# Patient Record
Sex: Male | Born: 1995 | Race: Black or African American | Hispanic: No | Marital: Single | Smoking: Current every day smoker
Health system: Southern US, Community
[De-identification: ages and names within clinical notes are randomized; demographics above are authoritative.]

## PROBLEM LIST (undated history)

## (undated) DIAGNOSIS — R569 Unspecified convulsions: Secondary | ICD-10-CM

## (undated) DIAGNOSIS — D849 Immunodeficiency, unspecified: Secondary | ICD-10-CM

## (undated) DIAGNOSIS — A749 Chlamydial infection, unspecified: Secondary | ICD-10-CM

## (undated) DIAGNOSIS — A546 Gonococcal infection of anus and rectum: Secondary | ICD-10-CM

## (undated) DIAGNOSIS — A539 Syphilis, unspecified: Secondary | ICD-10-CM

## (undated) DIAGNOSIS — N179 Acute kidney failure, unspecified: Secondary | ICD-10-CM

## (undated) DIAGNOSIS — L03119 Cellulitis of unspecified part of limb: Secondary | ICD-10-CM

## (undated) DIAGNOSIS — Z789 Other specified health status: Secondary | ICD-10-CM

## (undated) DIAGNOSIS — B20 Human immunodeficiency virus [HIV] disease: Secondary | ICD-10-CM

## (undated) DIAGNOSIS — B3781 Candidal esophagitis: Secondary | ICD-10-CM

## (undated) DIAGNOSIS — A0472 Enterocolitis due to Clostridium difficile, not specified as recurrent: Secondary | ICD-10-CM

## (undated) DIAGNOSIS — R85612 Low grade squamous intraepithelial lesion on cytologic smear of anus (LGSIL): Secondary | ICD-10-CM

## (undated) DIAGNOSIS — A072 Cryptosporidiosis: Secondary | ICD-10-CM

## (undated) DIAGNOSIS — K602 Anal fissure, unspecified: Secondary | ICD-10-CM

## (undated) DIAGNOSIS — A6000 Herpesviral infection of urogenital system, unspecified: Secondary | ICD-10-CM

## (undated) DIAGNOSIS — A5149 Other secondary syphilitic conditions: Secondary | ICD-10-CM

## (undated) HISTORY — PX: NO PAST SURGERIES: SHX2092

## (undated) HISTORY — PX: WISDOM TOOTH EXTRACTION: SHX21

## (undated) HISTORY — DX: Gonococcal infection of anus and rectum: A54.6

## (undated) HISTORY — PX: ANAL EXAMINATION UNDER ANESTHESIA: SHX1138

## (undated) HISTORY — DX: Chlamydial infection, unspecified: A74.9

---
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
## 2015-09-27 DIAGNOSIS — R569 Unspecified convulsions: Secondary | ICD-10-CM

## 2015-09-27 DIAGNOSIS — K602 Anal fissure, unspecified: Secondary | ICD-10-CM

## 2015-09-27 DIAGNOSIS — A6000 Herpesviral infection of urogenital system, unspecified: Secondary | ICD-10-CM

## 2015-09-27 DIAGNOSIS — B3781 Candidal esophagitis: Secondary | ICD-10-CM

## 2015-09-27 HISTORY — DX: Unspecified convulsions: R56.9

## 2015-09-27 HISTORY — DX: Herpesviral infection of urogenital system, unspecified: A60.00

## 2015-09-27 HISTORY — DX: Candidal esophagitis: B37.81

## 2015-09-27 HISTORY — DX: Anal fissure, unspecified: K60.2

## 2016-07-27 DIAGNOSIS — L03119 Cellulitis of unspecified part of limb: Secondary | ICD-10-CM

## 2016-07-27 HISTORY — DX: Cellulitis of unspecified part of limb: L03.119

## 2016-08-13 ENCOUNTER — Emergency Department (HOSPITAL_COMMUNITY): Payer: Medicaid Other

## 2016-08-13 ENCOUNTER — Encounter (HOSPITAL_COMMUNITY): Payer: Self-pay | Admitting: Emergency Medicine

## 2016-08-13 ENCOUNTER — Emergency Department (HOSPITAL_COMMUNITY)
Admission: EM | Admit: 2016-08-13 | Discharge: 2016-08-13 | Disposition: A | Discharge status: Home / Self Care | Payer: Medicaid Other | Attending: Emergency Medicine | Admitting: Emergency Medicine

## 2016-08-13 DIAGNOSIS — Y999 Unspecified external cause status: Secondary | ICD-10-CM | POA: Diagnosis not present

## 2016-08-13 DIAGNOSIS — R059 Cough, unspecified: Secondary | ICD-10-CM

## 2016-08-13 DIAGNOSIS — B379 Candidiasis, unspecified: Secondary | ICD-10-CM | POA: Insufficient documentation

## 2016-08-13 DIAGNOSIS — S31809A Unspecified open wound of unspecified buttock, initial encounter: Secondary | ICD-10-CM | POA: Insufficient documentation

## 2016-08-13 DIAGNOSIS — Y939 Activity, unspecified: Secondary | ICD-10-CM | POA: Insufficient documentation

## 2016-08-13 DIAGNOSIS — R05 Cough: Secondary | ICD-10-CM

## 2016-08-13 DIAGNOSIS — X58XXXA Exposure to other specified factors, initial encounter: Secondary | ICD-10-CM | POA: Insufficient documentation

## 2016-08-13 DIAGNOSIS — Y929 Unspecified place or not applicable: Secondary | ICD-10-CM | POA: Diagnosis not present

## 2016-08-13 DIAGNOSIS — L03818 Cellulitis of other sites: Secondary | ICD-10-CM | POA: Insufficient documentation

## 2016-08-13 DIAGNOSIS — F17210 Nicotine dependence, cigarettes, uncomplicated: Secondary | ICD-10-CM | POA: Diagnosis not present

## 2016-08-13 DIAGNOSIS — B370 Candidal stomatitis: Secondary | ICD-10-CM

## 2016-08-13 DIAGNOSIS — M7989 Other specified soft tissue disorders: Secondary | ICD-10-CM | POA: Insufficient documentation

## 2016-08-13 DIAGNOSIS — B20 Human immunodeficiency virus [HIV] disease: Secondary | ICD-10-CM | POA: Insufficient documentation

## 2016-08-13 DIAGNOSIS — K6289 Other specified diseases of anus and rectum: Secondary | ICD-10-CM | POA: Diagnosis not present

## 2016-08-13 DIAGNOSIS — S3992XA Unspecified injury of lower back, initial encounter: Secondary | ICD-10-CM | POA: Diagnosis present

## 2016-08-13 HISTORY — DX: Immunodeficiency, unspecified: D84.9

## 2016-08-13 HISTORY — DX: Human immunodeficiency virus (HIV) disease: B20

## 2016-08-13 LAB — BASIC METABOLIC PANEL
Anion gap: 7 (ref 5–15)
BUN: 10 mg/dL (ref 6–20)
CO2: 29 mmol/L (ref 22–32)
Calcium: 8.8 mg/dL — ABNORMAL LOW (ref 8.9–10.3)
Chloride: 101 mmol/L (ref 101–111)
Creatinine, Ser: 0.81 mg/dL (ref 0.61–1.24)
GFR calc Af Amer: >60 mL/min (ref >60)
GFR calc non Af Amer: >60 mL/min (ref >60)
Glucose, Bld: 87 mg/dL (ref 65–99)
Potassium: 3.3 mmol/L — ABNORMAL LOW (ref 3.5–5.1)
Sodium: 137 mmol/L (ref 135–145)

## 2016-08-13 LAB — CBC WITH DIFFERENTIAL/PLATELET
Basophils Absolute: 0 10*3/uL (ref 0.0–0.1)
Basophils Relative: 0 %
Eosinophils Absolute: 0.5 10*3/uL (ref 0.0–0.7)
Eosinophils Relative: 9 %
HCT: 33.3 % — ABNORMAL LOW (ref 39.0–52.0)
Hemoglobin: 11.1 g/dL — ABNORMAL LOW (ref 13.0–17.0)
Lymphocytes Relative: 11 %
Lymphs Abs: 0.6 10*3/uL — ABNORMAL LOW (ref 0.7–4.0)
MCH: 30.5 pg (ref 26.0–34.0)
MCHC: 33.3 g/dL (ref 30.0–36.0)
MCV: 91.5 fL (ref 78.0–100.0)
Monocytes Absolute: 0.5 10*3/uL (ref 0.1–1.0)
Monocytes Relative: 8 %
Neutro Abs: 4.2 10*3/uL (ref 1.7–7.7)
Neutrophils Relative %: 72 %
Platelets: 192 10*3/uL (ref 150–400)
RBC: 3.64 MIL/uL — ABNORMAL LOW (ref 4.22–5.81)
RDW: 13 % (ref 11.5–15.5)
WBC: 5.8 10*3/uL (ref 4.0–10.5)

## 2016-08-13 MED ORDER — FLUCONAZOLE 100 MG PO TABS: 100.0000 mg | ORAL_TABLET | Freq: Every day | ORAL | 0 refills | Status: AC

## 2016-08-13 MED ORDER — FLUCONAZOLE 100 MG PO TABS
200.0000 mg | ORAL_TABLET | Freq: Once | ORAL | Status: AC
  Administered 2016-08-13: 200 mg via ORAL
  Filled 2016-08-13: qty 2

## 2016-08-13 MED ORDER — DOXYCYCLINE HYCLATE 100 MG PO CAPS: 100.0000 mg | ORAL_CAPSULE | Freq: Two times a day (BID) | ORAL | 0 refills | Status: DC

## 2016-08-13 MED ORDER — IOPAMIDOL (ISOVUE-300) INJECTION 61%
INTRAVENOUS | Status: AC
  Administered 2016-08-13: 100 mL
  Filled 2016-08-13: qty 100

## 2016-08-13 MED ORDER — DEXTROSE 5 % IV SOLN
1.0000 g | Freq: Once | INTRAVENOUS | Status: AC
  Administered 2016-08-13: 1 g via INTRAVENOUS
  Filled 2016-08-13: qty 10

## 2016-08-13 MED ORDER — SODIUM CHLORIDE 0.9 % IV BOLUS (SEPSIS)
1000.0000 mL | Freq: Once | INTRAVENOUS | Status: AC
  Administered 2016-08-13: 1000 mL via INTRAVENOUS

## 2016-08-13 MED ORDER — METOCLOPRAMIDE HCL 5 MG/ML IJ SOLN
10.0000 mg | Freq: Once | INTRAMUSCULAR | Status: AC
  Administered 2016-08-13: 10 mg via INTRAVENOUS
  Filled 2016-08-13: qty 2

## 2016-08-13 NOTE — ED Notes (Signed)
Patient taken to CT.

## 2016-08-13 NOTE — ED Triage Notes (Signed)
Three complaints: 1. Middle finger of left hand is swollen for a week. No known injury. Warmth present. 2. Nasal congestion, sore throat, cough for a month.  3. Anal lesions that are bleeding and irritated. [facility] and did not follow up. 4. Has HIV and has not seen a doctor or taken any medications for two year. Concerned for progression of disease.

## 2016-08-13 NOTE — ED Provider Notes (Signed)
[facility]-EMERGENCY DEPT Provider Note   CSN: [id number] Arrival date & time: 08/13/16  1153     History   Chief Complaint Chief Complaint  Patient presents with  . Cough  . Finger Injury    HPI [name] [name] is a 20 y.o. male.  The history is provided by the patient and medical records. No language interpreter was used.   [name] [name] is a 20 y.o. male  with a PMH of HIV not receiving treatment and unsure of last CD4 count who presents to the Emergency Department with multiple complaints:  1. Left middle finger swelling. Initially started one week ago: atraumatic. He states that he just woke up with his finger red and swollen. He was [facility] where he states an x-ray was taken and he was told it was infected. He received "a shot for the infection" and rx for tramadol. Patient states swelling has somewhat improved but the finger is still red, painful and unable to move the finger correctly.   2. Worsening chest congestion for the last 1-2 months. Associated symptoms include sore throat, dysphagia, and cough productive of yellow and white sputum. He took Nyquil with little relief.   3. Perianal skin changes which initially developed in November 2016. He was told that he had several anal fissures. Initially were causing him no problems, however for the last 6 months he has been experiencing worsening symptoms including itching, pain with sitting and drainage: sometimes blood, sometimes purulent. He has tried Preparation H with no relief.   Patient denies fever, chest pain, shortness of breath, abdominal pain, constipation, diarrhea, back pain, numbness, tingling, muscle weakness.   Initially diagnosed with HIV in 2014 and [place] [facility] [facility] [place] and has not been able to follow up with her since that time due to transportation issues.    Past Medical History:  Diagnosis Date  . HIV disease (HCC)   . Immune  deficiency disorder (HCC)     There are no active problems to display for this patient.   History reviewed. No pertinent surgical history.     Home Medications    Prior to Admission medications   Medication Sig Start Date End Date Taking? Authorizing Provider  ibuprofen (ADVIL,MOTRIN) 200 MG tablet Take 400 mg by mouth every 6 (six) hours as needed for headache or moderate pain.   Yes Historical Provider, MD  Pseudoeph-Doxylamine-DM-APAP (NYQUIL PO) Take 2 capsules by mouth as needed (for cough and congestion).   Yes Historical Provider, MD  doxycycline (VIBRAMYCIN) 100 MG capsule Take 1 capsule (100 mg total) by mouth 2 (two) times daily. 08/13/16   Rosalita Carey Pilcher Demarius Archila, PA-C  fluconazole (DIFLUCAN) 100 MG tablet Take 1 tablet (100 mg total) by mouth daily. 08/13/16 08/27/16  Chase PicketJaime Pilcher Jaxsyn Catalfamo, PA-C    Family History History reviewed. No pertinent family history.  Social History Social History  Substance Use Topics  . Smoking status: Current Every Day Smoker    Types: Cigarettes  . Smokeless tobacco: Never Used  . Alcohol use No     Allergies   Patient has no known allergies.   Review of Systems Review of Systems  Constitutional: Negative for chills and fever.  HENT: Positive for sore throat and trouble swallowing. Negative for congestion.   Eyes: Negative for visual disturbance.  Respiratory: Positive for cough. Negative for shortness of breath and wheezing.   Cardiovascular: Negative for chest pain.  Gastrointestinal:  Negative for abdominal pain, constipation, diarrhea, nausea and vomiting.  Musculoskeletal: Positive for arthralgias and joint swelling.  Skin: Positive for color change and wound.  Allergic/Immunologic: Positive for immunocompromised state.  Neurological: Negative for headaches.     Physical Exam Updated Vital Signs BP 138/90   Pulse 90   Temp 98.6 F (37 C) (Oral)   Resp 18   SpO2 96%   Physical Exam  Constitutional: He is oriented to  person, place, and time. He appears well-developed and well-nourished. No distress.  HENT:  Head: Normocephalic and atraumatic.  White exudates covering entire oropharynx.   Cardiovascular: Normal heart sounds.   No murmur heard. Tachycardic but regular.  Pulmonary/Chest: Effort normal and breath sounds normal. No respiratory distress. He has no wheezes. He has no rales. He exhibits no tenderness.  Abdominal: Soft. Bowel sounds are normal. He exhibits no distension. There is no tenderness.  Genitourinary:  Genitourinary Comments: Perianal region with multiple areas of skin maceration which is tender to the touch. No focal areas of fluctuance.   Musculoskeletal:  Left middle finger warm, erythematous and swollen with decreased ROM. 2+ radial pulse and sensation intact.   Neurological: He is alert and oriented to person, place, and time.  Skin: Skin is warm and dry.  Nursing note and vitals reviewed.    ED Treatments / Results  Labs (all labs ordered are listed, but only abnormal results are displayed) Labs Reviewed  CBC WITH DIFFERENTIAL/PLATELET - Abnormal; Notable for the following:       Result Value   RBC 3.64 (*)    Hemoglobin 11.1 (*)    HCT 33.3 (*)    Lymphs Abs 0.6 (*)    All other components within normal limits  BASIC METABOLIC PANEL - Abnormal; Notable for the following:    Potassium 3.3 (*)    Calcium 8.8 (*)    All other components within normal limits  T-HELPER CELLS (CD4) COUNT (NOT AT [facility])  HIV-1 RNA ULTRAQUANT REFLEX TO GENTYP+    EKG  EKG Interpretation None       Radiology Dg Chest 2 View  Result Date: 08/13/2016 CLINICAL DATA:  Chest pain and shortness of breath EXAM: CHEST  2 VIEW COMPARISON:  None. FINDINGS: The lungs are clear. Heart size and pulmonary vascularity are normal. No pneumothorax. No adenopathy. No bone lesions. IMPRESSION: No edema or consolidation. Electronically Signed   By: Bretta Bang III M.D.   On: 08/13/2016 13:59    Ct Abdomen Pelvis W Contrast  Result Date: 08/13/2016 CLINICAL DATA:  A normal region bleeding and irritation. HIV disease. EXAM: CT ABDOMEN AND PELVIS WITH CONTRAST TECHNIQUE: Multidetector CT imaging of the abdomen and pelvis was performed using the standard protocol following bolus administration of intravenous contrast. CONTRAST:  ISOVUE-300 IOPAMIDOL (ISOVUE-300) INJECTION 61% COMPARISON:  None. FINDINGS: Lower chest: Lung bases are clear. Hepatobiliary: No focal liver lesions are evident. Gallbladder is contracted. No pericholecystic fluid evident. There is no biliary duct dilatation. Pancreas: There is no evident pancreatic mass or inflammatory focus. Spleen: Spleen appears upper normal in size. No focal splenic lesions are evident. Adrenals/Urinary Tract: Adrenals appear unremarkable bilaterally. There is no renal mass or hydronephrosis on either side. There is no evident renal or ureteral calculus on either side. Urinary bladder is midline with wall thickness within normal limits. Stomach/Bowel: There is no appreciable bowel wall or mesenteric thickening. In the rectal region, there is soft tissue stranding without abscess or fluid collection. No fistula is evident in  the in the rectal region. No abnormal areas seen in this region. There is no evident bowel obstruction. No free air or portal venous air. Vascular/Lymphatic: No abdominal aortic aneurysm. No vascular lesions are evident. There are several mildly prominent inguinal lymph nodes bilaterally. The largest inguinal lymph node on the left measures 1.7 x 1.0 cm. The largest individual inguinal lymph node on the right measures 1.7 x 1.3 cm. No other areas of lymph node enlargement are appreciable on this study. Reproductive: Prostate and seminal vesicles appear unremarkable in size and contour. No pelvic mass or pelvic fluid collection evident. Other: Appendix appears unremarkable. There is no abscess or ascites in the abdomen or pelvis.  Musculoskeletal: No evident blastic or lytic bone lesion. No intramuscular or abdominal wall lesion. IMPRESSION: Evidence a degree of proctitis without abscess or in a rectal fistula appreciable. No bowel wall thickening or bowel obstruction.  No free air. Several prominent inguinal lymph nodes. No other adenopathy evident. Spleen upper normal in size. No demonstrable abscess in the abdomen or pelvis. Appendix region appears normal. No renal or ureteral calculus. No hydronephrosis. Electronically Signed   By: Bretta Bang III M.D.   On: 08/13/2016 16:06   Dg Hand Complete Left  Result Date: 08/13/2016 CLINICAL DATA:  Pain and swelling following recent injury at work EXAM: LEFT HAND - COMPLETE 3+ VIEW COMPARISON:  August 08, 2016 FINDINGS: Frontal, oblique, and lateral views were obtained. There is mild swelling of the third digit. No evident fracture or dislocation. A tiny calcification is noted in the lateral aspect of the second MCP joint, stable. No joint space narrowing or erosion. IMPRESSION: Swelling third digit. No appreciable joint space narrowing or erosion. Small calcification in the lateral second MCP joint may represent residua of old trauma or have arthropathic etiology. No acute fracture or dislocation evident. Electronically Signed   By: Bretta Bang III M.D.   On: 08/13/2016 14:01    Procedures Procedures (including critical care time)  Medications Ordered in ED Medications  sodium chloride 0.9 % bolus 1,000 mL (0 mLs Intravenous Stopped 08/13/16 1733)  fluconazole (DIFLUCAN) tablet 200 mg (200 mg Oral Given 08/13/16 1407)  iopamidol (ISOVUE-300) 61 % injection (100 mLs  Contrast Given 08/13/16 1535)  metoCLOPramide (REGLAN) injection 10 mg (10 mg Intravenous Given 08/13/16 1605)  cefTRIAXone (ROCEPHIN) 1 g in dextrose 5 % 50 mL IVPB (0 g Intravenous Stopped 08/13/16 1803)     Initial Impression / Assessment and Plan / ED Course  I have reviewed the triage vital  signs and the nursing notes.  Pertinent labs & imaging results that were available during my care of the patient were reviewed by me and considered in my medical decision making (see chart for details).  Clinical Course    [name] [name] is a 20 y.o. male who presents to ED with multiple complaints which all seem to be 2/2 HIV not receiving treatment and noncompliant with follow up care. He has not seen physician in > 1 year and does not have an ID doctor in the area currently. A significant amount of time was taken to discuss the importance of follow-up care for overall health, especially with a known diagnosis of HIV and the risks of not following up were discussed as well.  1. Left finger erythema and swelling. He has been seen in an outside ED for same and notes that symptoms are improving. X-ray obtained and reassuring. No open wounds, paronychia/abscess.  2. Sore throat and dysphasia. On exam,  patient has severe thrush in OP and likely has esophageal candidiasis as well. CXR negative. Diflucan given in ED and Rx for home given as well.  3. Open wounds to perianal area. No areas of abscess were appreciated on examination, however given extensive nature, CT was obtained which shows proctitis without evidence of abscess or rectal fistula and several prominent inguinal lymph nodes.  Will treat patient with Rocephin in ED and doxycycline for home. Coupons were provided for prescriptions and again stressed the importance of medication compliance and follow up care. CD4 and quant RNA obtained to aide in outpatient follow up with ID as patient agrees to follow up. Referral given. All questions answered.   Patient seen by and discussed with Dr. Juleen ChinaKohut who agrees with treatment plan.    Final Clinical Impressions(s) / ED Diagnoses   Final diagnoses:  Cough  Open wound of buttock  Proctitis  Thrush  Cellulitis of other specified site    New Prescriptions New Prescriptions   DOXYCYCLINE  (VIBRAMYCIN) 100 MG CAPSULE    Take 1 capsule (100 mg total) by mouth 2 (two) times daily.   FLUCONAZOLE (DIFLUCAN) 100 MG TABLET    Take 1 tablet (100 mg total) by mouth daily.     [facility] Hospital[facility]ime Pilcher Samyuktha Brau, PA-C 08/13/16 1829    Raeford RazorStephen Kohut, MD 08/22/16 [phone]

## 2016-08-13 NOTE — Discharge Instructions (Signed)
It was my pleasure taking care of you today!   Please call the infectious disease physician listed on Monday to schedule a follow up appointment.  Take antibiotics as directed. I have provided you with a coupon for both prescriptions which will only work for [facility]. Return to ER for new or worsening symptoms, any additional concerns.

## 2016-08-13 NOTE — ED Notes (Signed)
Patient Alert and oriented X4. Stable and ambulatory. Patient verbalized understanding of the discharge instructions.  Patient belongings were taken by the patient.  

## 2016-08-15 LAB — T-HELPER CELLS (CD4) COUNT (NOT AT ARMC)
CD4 % Helper T Cell: <1 % — ABNORMAL LOW (ref 33–55)
CD4 T Cell Abs: <10 /uL — ABNORMAL LOW (ref 400–2700)

## 2016-08-19 ENCOUNTER — Telehealth: Payer: Self-pay | Admitting: *Deleted

## 2016-08-23 LAB — REFLEX TO GENOSURE(R) MG: HIV GenoSure(R) MG PDF: 0

## 2016-08-23 LAB — HIV-1 RNA ULTRAQUANT REFLEX TO GENTYP+
HIV-1 RNA BY PCR: 586000 copies/mL
HIV-1 RNA Quant, Log: 5.768 log10copy/mL

## 2016-08-29 ENCOUNTER — Emergency Department (HOSPITAL_COMMUNITY): Payer: Medicaid Other

## 2016-08-29 ENCOUNTER — Inpatient Hospital Stay (HOSPITAL_COMMUNITY)
Admission: EM | Admit: 2016-08-29 | Discharge: 2016-09-01 | DRG: 100 | Disposition: A | Discharge status: Home / Self Care | Payer: Medicaid Other | Attending: Internal Medicine | Admitting: Internal Medicine

## 2016-08-29 ENCOUNTER — Inpatient Hospital Stay (HOSPITAL_COMMUNITY): Payer: Medicaid Other

## 2016-08-29 ENCOUNTER — Encounter (HOSPITAL_COMMUNITY): Payer: Self-pay | Admitting: Emergency Medicine

## 2016-08-29 DIAGNOSIS — M542 Cervicalgia: Secondary | ICD-10-CM | POA: Diagnosis present

## 2016-08-29 DIAGNOSIS — Z597 Insufficient social insurance and welfare support: Secondary | ICD-10-CM

## 2016-08-29 DIAGNOSIS — F17210 Nicotine dependence, cigarettes, uncomplicated: Secondary | ICD-10-CM | POA: Diagnosis present

## 2016-08-29 DIAGNOSIS — K602 Anal fissure, unspecified: Secondary | ICD-10-CM | POA: Diagnosis present

## 2016-08-29 DIAGNOSIS — L98429 Non-pressure chronic ulcer of back with unspecified severity: Secondary | ICD-10-CM | POA: Diagnosis present

## 2016-08-29 DIAGNOSIS — L98419 Non-pressure chronic ulcer of buttock with unspecified severity: Secondary | ICD-10-CM | POA: Diagnosis present

## 2016-08-29 DIAGNOSIS — E876 Hypokalemia: Secondary | ICD-10-CM

## 2016-08-29 DIAGNOSIS — D649 Anemia, unspecified: Secondary | ICD-10-CM | POA: Diagnosis present

## 2016-08-29 DIAGNOSIS — Z7252 High risk homosexual behavior: Secondary | ICD-10-CM

## 2016-08-29 DIAGNOSIS — I10 Essential (primary) hypertension: Secondary | ICD-10-CM | POA: Diagnosis present

## 2016-08-29 DIAGNOSIS — R569 Unspecified convulsions: Principal | ICD-10-CM

## 2016-08-29 DIAGNOSIS — Z23 Encounter for immunization: Secondary | ICD-10-CM

## 2016-08-29 DIAGNOSIS — B20 Human immunodeficiency virus [HIV] disease: Secondary | ICD-10-CM

## 2016-08-29 DIAGNOSIS — R05 Cough: Secondary | ICD-10-CM

## 2016-08-29 DIAGNOSIS — Z21 Asymptomatic human immunodeficiency virus [HIV] infection status: Secondary | ICD-10-CM

## 2016-08-29 DIAGNOSIS — Z8619 Personal history of other infectious and parasitic diseases: Secondary | ICD-10-CM

## 2016-08-29 DIAGNOSIS — R059 Cough, unspecified: Secondary | ICD-10-CM

## 2016-08-29 HISTORY — DX: Low grade squamous intraepithelial lesion on cytologic smear of anus (LGSIL): R85.612

## 2016-08-29 HISTORY — DX: Syphilis, unspecified: A53.9

## 2016-08-29 HISTORY — DX: Other specified health status: Z78.9

## 2016-08-29 LAB — CBC WITH DIFFERENTIAL/PLATELET
Basophils Absolute: 0 10*3/uL (ref 0.0–0.1)
Basophils Relative: 0 %
Eosinophils Absolute: 0.7 10*3/uL (ref 0.0–0.7)
Eosinophils Relative: 16 %
HCT: 31.2 % — ABNORMAL LOW (ref 39.0–52.0)
Hemoglobin: 10.2 g/dL — ABNORMAL LOW (ref 13.0–17.0)
Lymphocytes Relative: 29 %
Lymphs Abs: 1.3 10*3/uL (ref 0.7–4.0)
MCH: 30.8 pg (ref 26.0–34.0)
MCHC: 32.7 g/dL (ref 30.0–36.0)
MCV: 94.3 fL (ref 78.0–100.0)
Monocytes Absolute: 0.5 10*3/uL (ref 0.1–1.0)
Monocytes Relative: 11 %
Neutro Abs: 2 10*3/uL (ref 1.7–7.7)
Neutrophils Relative %: 44 %
Platelets: 150 10*3/uL (ref 150–400)
RBC: 3.31 MIL/uL — ABNORMAL LOW (ref 4.22–5.81)
RDW: 14.9 % (ref 11.5–15.5)
WBC: 4.4 10*3/uL (ref 4.0–10.5)

## 2016-08-29 LAB — RAPID URINE DRUG SCREEN, HOSP PERFORMED
Amphetamines: NOT DETECTED
Barbiturates: NOT DETECTED
Benzodiazepines: NOT DETECTED
Cocaine: NOT DETECTED
Opiates: NOT DETECTED
Tetrahydrocannabinol: NOT DETECTED

## 2016-08-29 LAB — COMPREHENSIVE METABOLIC PANEL
ALT: 55 U/L (ref 17–63)
AST: 54 U/L — ABNORMAL HIGH (ref 15–41)
Albumin: 3.4 g/dL — ABNORMAL LOW (ref 3.5–5.0)
Alkaline Phosphatase: 72 U/L (ref 38–126)
Anion gap: 8 (ref 5–15)
BUN: 10 mg/dL (ref 6–20)
CO2: 28 mmol/L (ref 22–32)
Calcium: 9.3 mg/dL (ref 8.9–10.3)
Chloride: 103 mmol/L (ref 101–111)
Creatinine, Ser: 0.75 mg/dL (ref 0.61–1.24)
GFR calc Af Amer: >60 mL/min (ref >60)
GFR calc non Af Amer: >60 mL/min (ref >60)
Glucose, Bld: 99 mg/dL (ref 65–99)
Potassium: 3.2 mmol/L — ABNORMAL LOW (ref 3.5–5.1)
Sodium: 139 mmol/L (ref 135–145)
Total Bilirubin: 0.5 mg/dL (ref 0.3–1.2)
Total Protein: 9 g/dL — ABNORMAL HIGH (ref 6.5–8.1)

## 2016-08-29 LAB — URINALYSIS, ROUTINE W REFLEX MICROSCOPIC
Bilirubin Urine: NEGATIVE
Glucose, UA: NEGATIVE mg/dL
Hgb urine dipstick: NEGATIVE
Ketones, ur: NEGATIVE mg/dL
Leukocytes, UA: NEGATIVE
Nitrite: NEGATIVE
Protein, ur: 30 mg/dL — AB
Specific Gravity, Urine: 1.022 (ref 1.005–1.030)
pH: 7 (ref 5.0–8.0)

## 2016-08-29 LAB — URINE MICROSCOPIC-ADD ON

## 2016-08-29 LAB — CSF CELL COUNT WITH DIFFERENTIAL
RBC Count, CSF: 0 /mm3
Tube #: 1
WBC, CSF: 1 /mm3 (ref 0–5)

## 2016-08-29 LAB — TSH: TSH: 3.271 u[IU]/mL (ref 0.350–4.500)

## 2016-08-29 LAB — PROTEIN, CSF: Total  Protein, CSF: 141 mg/dL — ABNORMAL HIGH (ref 15–45)

## 2016-08-29 LAB — CRYPTOCOCCAL ANTIGEN, CSF: Crypto Ag: NEGATIVE

## 2016-08-29 LAB — GLUCOSE, CSF: Glucose, CSF: 48 mg/dL (ref 40–70)

## 2016-08-29 LAB — MAGNESIUM: Magnesium: 2.2 mg/dL (ref 1.7–2.4)

## 2016-08-29 MED ORDER — SODIUM CHLORIDE 0.9 % IV SOLN
75.0000 mL/h | INTRAVENOUS | Status: DC
  Administered 2016-08-29 – 2016-08-30 (×2): 75 mL/h via INTRAVENOUS

## 2016-08-29 MED ORDER — ENOXAPARIN SODIUM 40 MG/0.4ML ~~LOC~~ SOLN
40.0000 mg | SUBCUTANEOUS | Status: DC
  Administered 2016-08-29 – 2016-08-31 (×3): 40 mg via SUBCUTANEOUS
  Filled 2016-08-29 (×3): qty 0.4

## 2016-08-29 MED ORDER — POTASSIUM CHLORIDE CRYS ER 20 MEQ PO TBCR
40.0000 meq | EXTENDED_RELEASE_TABLET | Freq: Once | ORAL | Status: AC
  Administered 2016-08-29: 40 meq via ORAL
  Filled 2016-08-29: qty 2

## 2016-08-29 MED ORDER — SODIUM CHLORIDE 0.9% FLUSH
3.0000 mL | Freq: Two times a day (BID) | INTRAVENOUS | Status: DC
  Administered 2016-08-29 – 2016-09-01 (×5): 3 mL via INTRAVENOUS

## 2016-08-29 MED ORDER — LIDOCAINE-EPINEPHRINE (PF) 2 %-1:200000 IJ SOLN
20.0000 mL | Freq: Once | INTRAMUSCULAR | Status: AC
  Administered 2016-08-29: 20 mL via INTRADERMAL
  Filled 2016-08-29: qty 20

## 2016-08-29 MED ORDER — LORAZEPAM 2 MG/ML IJ SOLN: 1.0000 mg | INTRAMUSCULAR | Status: DC | PRN

## 2016-08-29 MED ORDER — KETOROLAC TROMETHAMINE 30 MG/ML IJ SOLN
30.0000 mg | Freq: Once | INTRAMUSCULAR | Status: AC
  Administered 2016-08-30: 30 mg via INTRAVENOUS
  Filled 2016-08-29: qty 1

## 2016-08-29 MED ORDER — GADOBENATE DIMEGLUMINE 529 MG/ML IV SOLN
20.0000 mL | Freq: Once | INTRAVENOUS | Status: AC | PRN
  Administered 2016-08-29: 20 mL via INTRAVENOUS

## 2016-08-29 MED ORDER — SULFAMETHOXAZOLE-TRIMETHOPRIM 800-160 MG PO TABS
1.0000 | ORAL_TABLET | Freq: Every day | ORAL | Status: DC
  Administered 2016-08-29 – 2016-09-01 (×4): 1 via ORAL
  Filled 2016-08-29 (×4): qty 1

## 2016-08-29 NOTE — ED Notes (Signed)
sz pads to bed 

## 2016-08-29 NOTE — ED Notes (Signed)
Pt signed permit for LP and dr Criss AlvineGoldston in  Doing it now

## 2016-08-29 NOTE — Progress Notes (Signed)
Pharmacy Antibiotic Note  [name] [name] is a 20 y.o. male admitted on 08/29/2016 with seizure.  Pharmacy has been consulted for sulfamethoxazole-trimethoprim dosing for PCP prophylaxis. Hx HIV, not currently on treatment.  ID following.  Discussed briefly with Dr. Karma GreaserBoswell.  Oral Septra dosing.   Pharmacy also to review anti-epileptic meds for drug interactions.  No scheduled meds at present.  Ativan 1-2 mg IV prn. Neurology to see on 12/5.  Plan:  Septra DS 1 tablet daily.  Will follow up studies, plans.  Follow up AEDs for any drug interactions.  Height: 6\' 7"  (200.7 cm) Weight: 162 lb 14.4 oz (73.9 kg) IBW/kg (Calculated) : 93.7  Temp (24hrs), Avg:98.8 F (37.1 C), Min:98.7 F (37.1 C), Max:98.9 F (37.2 C)   Recent Labs Lab 08/29/16 1210  WBC 4.4  CREATININE 0.75    Estimated Creatinine Clearance: 151.4 mL/min (by C-G formula based on SCr of 0.75 mg/dL).    No Known Allergies  Antimicrobials this admission:  Septra DS 12/4>>  Dose adjustments this admission:  n/a  Microbiology results:  12/4 blood x 2 -  12/4 CSF - no organisms seen on gram statin, culture pending  Thank you for allowing pharmacy to be a part of this patient's care.  Dennie Fettersgan, Lavalle Skoda Donovan, [place] Pager: [phone] 08/29/2016 9:12 PM

## 2016-08-29 NOTE — H&P (Signed)
Date: 08/29/2016               Patient Name:  [name] [name] MRN: [id number]  DOB: [birth year] Age / Sex: 20 y.o., male   PCP: No Pcp Per Patient              Medical Service: Internal Medicine Teaching Service              Attending Physician: Dr. Aldine Contes, MD    First Contact: Sydnee Levans, MS 3 Pager: [phone]  Second Contact: Dr. Margreta Journey Pager: [phone]  Third Contact Dr. Charlynn Grimes Pager: [phone]       After Hours (After 5p/  First Contact Pager: [phone]  weekends / holidays): Second Contact Pager: [phone]   Chief Complaint: Seizure  History of Present Illness:Millhouse, Sriram is a 20 yo M with a PMH of HIV currently on no medication with an undetectable cd4 count presents to the hospital via EMS after experiencing a seizure. The patient states he had been experiencing bitemporal pain located behind his eyes since Saturday. He had been using ibuprofen with some relief. Today, the patient had been watching TV when he experienced the eye pain and had an urge to fall asleep. He then awoke in the ambulance. His father states that at 930am, he found him lying on the floor convulsing. He describes the seizure as a full body contraction that involved shaking, foaming of the mouth and eyes rolling back. The patient states he became aware of the situation after about 20 mins when he arrived at the hospital.   He denies any recent illness fever cough, abdominal pain, diarrhea, vomiting, rashes, or bruising. No one had been recently sick around him.  The patient states he had recently visited urgent care for a finger injury for which he started taking Abx. He recently came to the ED for AIDS defining illness esophageal candidiasis and anal fissure with associated proctitis. He was treated with diflucan and rocephin and doxycycline. The patient states he was diagnosed with HIV 2014 anal receptively. He was initially treated but has been off treatment since 2016.   Patient states he is up to date on  his vaccinations, but hasn't received his flu shot.   The patient states his FH is only notable for HTN in his Grandmother and uncle. No h/o of seizure  His last sexual contact was June 2017. He occasionally uses MJ but denies ETOH and IV drugs. He started smoking cigarettes again due to stress. He lives at home with his father and occasionally younger brother. 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   In the Ed, the patient had a T of 98.7, HR of 87, RR of 20, BP of 156/100. Exam was unremarkable. He underwent a CT which showed no acute intracranial pathology. An MRI was ordered. LP was preformed and was Labs drawn. LP showed no white or red cells. Protein was notable at 141. Glucose was wnl. CSF Cx,  HSV PCF, Crypto antigen pending. CBC showed a mild anemia with no elevated WBC. CMP showed hypokalemia of 3.2. UA and UDS were unremarkable. The patient was admitted to the unit for further workup and consult for I&D.   Meds: No current facility-administered medications for this encounter.    Current Outpatient Prescriptions  Medication Sig Dispense Refill  . doxycycline (VIBRAMYCIN) 100 MG capsule Take 1 capsule (100 mg total) by mouth 2 (two) times daily. (Patient taking differently: Take 100 mg by mouth 2 (two) times daily. For  14 days) 14 capsule 0  . fluconazole (DIFLUCAN) 100 MG tablet Take 100 mg by mouth daily. Started on 08-23-16 for 14 days    . ibuprofen (ADVIL,MOTRIN) 200 MG tablet Take 400 mg by mouth every 6 (six) hours as needed for headache or moderate pain.    . Pseudoeph-Doxylamine-DM-APAP (NYQUIL PO) Take 2 capsules by mouth as needed (for cough and congestion).      Allergies: Allergies as of 08/29/2016  . (No Known Allergies)   Past Medical History:  Diagnosis Date  . HIV disease ([place])   . Immune deficiency disorder ([place])    History reviewed. No pertinent surgical history. History reviewed. No pertinent family history. Social History   Social History  .  Marital status: Single    Spouse name: N/A  . Number of children: N/A  . Years of education: N/A   Occupational History  . Not on file.   Social History Main Topics  . Smoking status: Current Every Day Smoker    Types: Cigarettes  . Smokeless tobacco: Never Used  . Alcohol use No  . Drug use:     Types: Marijuana  . Sexual activity: No   Other Topics Concern  . Not on file   Social History Narrative  . No narrative on file    Review of Systems: Constitutional: negative for chills, fatigue, fevers and sweats Ears, nose, mouth, throat, and face: negative for nasal congestion and sore throat Respiratory: positive for cough, negative for pleurisy/chest pain Cardiovascular: negative for chest pain, fatigue and palpitations Gastrointestinal: negative for constipation, diarrhea, nausea and vomiting Genitourinary:negative for dysuria Hematologic/lymphatic: negative for bleeding Musculoskeletal:negative for arthralgias Neurological: negative for dizziness, gait problems, memory problems and vertigo Behavioral/Psych: negative  Physical Exam: Blood pressure 144/90, pulse 96, temperature 98.7 F (37.1 C), temperature source Oral, resp. rate 19, SpO2 100 %. Gen: Well appearing young adult fully conversant with team, enjoying [employer]. Oriented x 3.  HEENT: small contusion of right parietal bone with no bruising or bleeding. Normocephalic. No nasal discharge. Oropharynx without lesion. Tongue is unremarkable. PERRL. Throat is unremarkable. Cv: RRR. Nl S1 S2. Nu murmurs rubs or heaves.  Resp: NWB. CTAB. Chest expansion symmetric.  Ab: Non distended. Normal bowel sounds. Non tender.  Neuro: No neck stiffness. EOM intact. Facial sensation is symmetric and intact. Facial movement is intact and symmetric. Hearing is symmetric. No dysarthria. Uvula and tongue midline. Shoulder and SCM strength 2+. Strength and sensation throughout in tact and 2+. Normal finger to nose. Patella reflexes 2+.   Vascular: No carotid bruits. tibialis posterior and dorsalis and radial 2+.  Ext: No obvious rashes. Warm. Cap refill <2 secs. MSK: No obviously deformed joints Psych: normal mood and affect  Lab results: LP showed no white or red cells. Protein was notable at 141. Glucose was wnl. CSF Cx,  HSV PCF, Cryto antigen pending. CBC showed a mild anemia with no elevated WBC. CMP showed hypokalemia of 3.2. UA and UDS were unremarkable.  Imaging results:  Ct Head Wo Contrast  Result Date: 08/29/2016 CLINICAL DATA:  Seizure, no pain EXAM: CT HEAD WITHOUT CONTRAST TECHNIQUE: Contiguous axial images were obtained from the base of the skull through the vertex without intravenous contrast. COMPARISON:  None. FINDINGS: Brain: No evidence of acute infarction, hemorrhage, hydrocephalus, extra-axial collection or mass lesion/mass effect. Vascular: No hyperdense vessel or unexpected calcification. Skull: No osseous abnormality. Sinuses/Orbits: There is bilateral maxillary sinus mucosal thickening. There is bilateral chronic frontoethmoidal recess sinus disease. There is left  ethmoid sinus mucosal thickening. Visualized mastoid sinuses are clear. Visualized orbits demonstrate no focal abnormality. Other: None IMPRESSION: No acute intracranial pathology. Electronically Signed   By: Kathreen Devoid   On: 08/29/2016 13:55    Other results: GFR:[id number] review. Tachycardic sinus rhythm. LVH criteria met.   Assessment & Plan by Problem: Active Problems:   Seizure ([place])  Marvel, Mcphillips is a 20 yo M with a PMH of HIV currently on no medication with an undetectable cd4 count presents to the hospital via EMS after experiencing a seizure.   Seizure - The patient's uncontrolled HIV, AIDS defining illness and cd4 undetectable is very worrisome for a infection. Although the patient exhibits no signs or symptoms of infection, seizure in an HIV positive patient is highly likely for an infectious etiology. No neurologic signs to  suggest a intracranial process but MRI is necessary to further evaluate. No neck stiffness to suggest meningitis although could be evident in immunocompromised. LP is not consistent with meningitis. Other could be a malignancy as well such as CNS lymphoma. UDS negative. No new meds. No Family history of seizures.   - Workup so far: CT Negative for intracranial. LP Cx HSV PCR and Crypto antigen. UA negative.   - Will continue: MRI head, CXR, RPR, HCV, G&C, blood Cx x2.   - Per I&D: CMV, JC (PML) spinal fluid test; no acyclovir - unlikely aseptic meningitis  - SMX - TMP prophylaxis  - Ativan 1 mg PRN for seizure> Seizure protocol  - CBC qd  - BMP qd  FENGI  - Regular diet  - 75 ml/hr NS  - DVT Pro: Lovenox 40 mg qd  Dispo  - Per work up  - >2hospital days  This is a Careers information officer Note.  The care of the patient was discussed with Dr. Philipp Ovens and the assessment and plan was formulated with their assistance.  Please see their note for official documentation of the patient encounter.   Signed: Benn Moulder, Medical Student 08/29/2016, 6:32 PM

## 2016-08-29 NOTE — ED Notes (Signed)
Ran out of meds and has not been able to get them filled

## 2016-08-29 NOTE — ED Notes (Signed)
Patient transported to CT 

## 2016-08-29 NOTE — ED Triage Notes (Signed)
Father walked in living room found pt on floor shaking all over lasted about a min , no hx of sz, pt postictal upon ems arrival, no aaox3, pt states he had pain behind his eys

## 2016-08-29 NOTE — Progress Notes (Signed)
Pt admitted to the unit at 1845. Pt mental status is A&Ox4. Pt oriented to room, staff, and call bell. Skin is intact. Call bell within reach. Visitor guidelines reviewed w/ pt and/or family. Seizure precautions for Pt with padded rails, suction and O2 at bedside.

## 2016-08-29 NOTE — H&P (Signed)
Date: 08/29/2016               Patient Name:  [name] [name] MRN: [id number]  DOB: December 11, 1995 Age / Sex: 20 y.o., male   PCP: No Pcp Per Patient         Medical Service: Internal Medicine Teaching Service         Attending Physician: Dr. Earl LagosNischal Narendra, MD    First Contact: Dr. Antony ContrasGuilloud Pager: [phone]  Second Contact: Dr. Karma GreaserBoswell Pager: [phone]       After Hours (After 5p/  First Contact Pager: [phone]  weekends / holidays): Second Contact Pager: [phone]   Chief Complaint: Seizure  History of Present Illness: Patient is a 20 yo M with pmhx of HIV, currently not on treatment, who presents after a witnessed seizure from home. Patient says he was watching TV when he developed a severe HA with pain behind his bilateral eyes. He says he must have passed out because the next thing he remembers he was riding the the ambulance on the way to the hospital. Dad says he was in the kitchen when he heard a loud noise presumable from his son's fall. He immediately went into the living room where he found his son foaming at the mouth and shaking all over with his eyes rolled back in his head. The episode lasted for a total of about 5 minutes. He was very confused and disoriented following the episode and did not returned to baseline until about 15 minutes after the episode ended. He denies loss of bowel or bladder control and tongue biting. He did hit his head on the coffee table during the fall and complains of a knot on the back on his head. Patient says he was diagnosed with HIV at age 20. He [facility] and was on treatment until the age of 20, but he lost insurance and has not followed up. Recently seen in the ED twp weeks ago for left middle finger cellulitis and esophageal candidiasis. CD4 count at that time <10. He was given Rocephin and diflucan in the ED and sent home with a prescription for doxycycline. Pain and swelling improved.   On arrival to  the ED, patient was alert and oriented x 3. Vitals were stable. He was afebrile T 98.7, BP 156/100, HR 87, RR 20, and oxygen 100% on RA. CD4 count this admission was undetectable. CBC was notable for a normocytic anemia with hgb 10.2. WBC was 4.4. CMP with mild hypokalemia at 3.2, and AST mildly elevated at 54, ALT normal at 55, otherwise unremarkable. Patient underwent lumbar puncture with normal opening pressure. CSF was clear with 0 rbcs and 1 wbc. CSF protein was elevated at 141, glucose was 48. Gram stain showed some wbc but no organsims were seen, cultures are pending. CSF cryptococcal antigen was negative. UA was normal and UDS was negative.   Meds:  Current Meds  Medication Sig  . doxycycline (VIBRAMYCIN) 100 MG capsule Take 1 capsule (100 mg total) by mouth 2 (two) times daily. (Patient taking differently: Take 100 mg by mouth 2 (two) times daily. For 14 days)  . fluconazole (DIFLUCAN) 100 MG tablet Take 100 mg by mouth daily. Started on 08-23-16 for 14 days  . ibuprofen (ADVIL,MOTRIN) 200 MG tablet Take 400 mg by mouth every 6 (six) hours as needed for headache or moderate pain.  . Pseudoeph-Doxylamine-DM-APAP (NYQUIL PO) Take 2 capsules by mouth as needed (for cough and  congestion).     Allergies: Allergies as of 08/29/2016  . (No Known Allergies)   Past Medical History:  Diagnosis Date  . HIV disease (HCC)   . Immune deficiency disorder [facility](HCC)     Family History:   Social History: Lives at home with his Dad. Not currently sexually active but partners with men. Last sexually active in June of this year. Partner was aware of HIV status per patient. Denies IV drug use. Denies alcohol use. Smokes 3 cigarettes a day, started smoking at age 20.   Review of Systems: A complete ROS was negative except as per HPI.   Physical Exam: Blood pressure (!) 147/77, pulse 86, temperature 98.9 F (37.2 C), temperature source Oral, resp. rate 18, height 6\' 7"  (2.007 m), weight 162 lb 14.4 oz  (73.9 kg), SpO2 100 %. Constitutional: NAD, appears comfortable HEENT: Atraumatic, normocephalic. PERRL, anicteric sclera.  Cardiovascular: RRR, no murmurs, rubs, or gallops.  Pulmonary/Chest: CTAB, no wheezes, rales, or rhonchi.  Abdominal: Soft, non tender, non distended. +BS.  Extremities: Warm and well perfused. Distal pulses intact. No edema.  Neurological: A&Ox3, CN II - XII grossly intact. Strength and sensation symmetric bilaterally. No focal deficits.  Skin: No rashes or erythema  Psychiatric: Normal mood and affect  EKG: Personally reviewed, Sinus tachycardia. LVH, T wave inversions in V5, V6. No ST depression or elevation. No priors for comparison.  CXR: Pending   Assessment & Plan by Problem:  Seizure: In the setting of untreated HIV and an undetectable CD4 count. CSF appears aseptic on lumbar puncture and opening pressure was normal. CT head was negative for any acute intracranial abnormalities. Suspicion for infection is high and differential remains broad at this point. CSF cryptococcal antigen was negative. Possible etiologies include Herpes encephalitis vs. CMV encephalitis vs. Toxoplasma vs. PML vs. primary CNS lymphoma. MRI is pending.   -- ID consult, appreciate recommendations  -- HIV genotype  -- F/u MRI -- F/u CXR -- CSF cultures pending -- HSV PCR  -- JC virus PCR -- CMV PCR -- GC/Chlamydia urine probe  -- RPR  -- Blood cultures x 2 -- TSH -- Hep C -- NS 75 cc/hr  -- Seizure precautions  -- Ativan prn for seizure activity  -- Bactrim daily for ppx  -- Neuro consult in AM   Hypokalemia: 3.2 on admission labs -- Replete 40 mEq PO x 1 -- recheck AM labs  FEN: 75 cc/hr, replete lytes prn, regular diet VTE ppx: Lovenox  Code Status: FULL  Dispo: Admit patient to Inpatient with expected length of stay greater than 2 midnights.  Signed: Reymundo Pollarolyn Orvetta Danielski, MD 08/29/2016, 7:27 PM  Pager: [phone]

## 2016-08-29 NOTE — ED Notes (Signed)
Back from ct.

## 2016-08-29 NOTE — ED Notes (Signed)
Pt flat for an hour per dr Criss AlvineGoldston

## 2016-08-29 NOTE — ED Notes (Signed)
Specimens to lab.

## 2016-08-29 NOTE — Consult Note (Addendum)
[facility] for Infectious Disease  Date of Admission:  08/29/2016  Date of Consult:  08/29/2016  Reason for Consult: HIV, seizure Referring Physician: Boswell  Impression/Recommendation HIV+ Would check HIV RNA and genotype Would recheck his CD4 Would hold ART at this time.  He needs ADAP, case mgr, pharm assistance  Seizure Would check CSF JC virus Would check CSF HSV PCR Would check CSF CMV PCR Consider MRI and neuro eval  Anal lesion- Bx 12-2015 no malignancy LGSIL 2013 Will need f/u anoscopy  Comment Infectious cause of his seizure seems less likely. He has been on NSAIDS.  JC virus could do this but his MRI would be destinctive.  I will be glad to see him in clinic for f/u  Thank you so much for this interesting consult,   Bobby Rumpf (pager) [phone] [email or web address]  [name] [name] is an 20 y.o. male.  HPI: 20 yo M with dx of HIV+ and syphilis in 2013. He was seen at [facility] and started on complera. He was lost to f/u until 2017. He was seen in [facility] 10-17 and had CD4 10 (04-2016)  and HIV RNA of 135,000.  He has been off his ART due to lapse in insurance.   He comes to ED today with pain and pressure behind his eyes while at rest and then 1 minute of generalized seizure witnessed/described by his father. He had no recollection of event until he awoke in ED. Now staes that he fell and hit his R side of his head on coffee table.  No hx of photophobia, f/c, meningismus.   In ED he had LP showing 1 WBC, Glc 48 and Prot 141. Crypto Ag (-). CT head was negative.   Past Medical History:  Diagnosis Date  . HIV disease ([place])   . Immune deficiency disorder ([place])     History reviewed. No pertinent surgical history.   No Known Allergies  Medications:  Scheduled: . enoxaparin (LOVENOX) injection  40 mg Subcutaneous Q24H  . sodium chloride flush  3 mL Intravenous Q12H  . sulfamethoxazole-trimethoprim  1 tablet Oral Daily    Abtx:    Anti-infectives    None      Total days of antibiotics: 0          Social History:  reports that he has been smoking Cigarettes.  He has never used smokeless tobacco. He reports that he uses drugs, including Marijuana. He reports that he does not drink alcohol.  History reviewed. No pertinent family history.  General ROS: no f/c, no dyphagia or ulcers, no cough. + headhache. + backpain. no dysuria. see HPI.   Blood pressure (!) 147/77, pulse 86, temperature 98.9 F (37.2 C), temperature source Oral, resp. rate 18, height _0  (2.007 m), weight 73.9 kg (162 lb 14.4 oz), SpO2 100 %. General appearance: alert, cooperative and no distress Eyes: negative findings: conjunctivae and sclerae normal and pupils equal, round, reactive to light and accomodation Throat: lips, mucosa, and tongue normal; teeth and gums normal Neck: no adenopathy, supple, symmetrical, trachea midline and FROM, no meningismus.  Lungs: clear to auscultation bilaterally Heart: regular rate and rhythm Abdomen: normal findings: bowel sounds normal and soft, non-tender Extremities: edema none. no rashes on hands or feet.  Neurologic: Mental status: Alert, oriented, thought content appropriate Cranial nerves: normal Motor: grossly normal  No axillary, cervical or supraclavicular LAN   Results for orders placed or performed during the hospital encounter of 08/29/16 (from the past  48 hour(s))  Comprehensive metabolic panel     Status: Abnormal   Collection Time: 08/29/16 12:10 PM  Result Value Ref Range   Sodium 139 135 - 145 mmol/L   Potassium 3.2 (L) 3.5 - 5.1 mmol/L   Chloride 103 101 - 111 mmol/L   CO2 28 22 - 32 mmol/L   Glucose, Bld 99 65 - 99 mg/dL   BUN 10 6 - 20 mg/dL   Creatinine, Ser 0.75 0.61 - 1.24 mg/dL   Calcium 9.3 8.9 - 10.3 mg/dL   Total Protein 9.0 (H) 6.5 - 8.1 g/dL   Albumin 3.4 (L) 3.5 - 5.0 g/dL   AST 54 (H) 15 - 41 U/L   ALT 55 17 - 63 U/L   Alkaline Phosphatase 72 38 - 126 U/L    Total Bilirubin 0.5 0.3 - 1.2 mg/dL   GFR calc non Af Amer >60 >60 mL/min   GFR calc Af Amer >60 >60 mL/min    Comment: (NOTE) The eGFR has been calculated using the CKD EPI equation. This calculation has not been validated in all clinical situations. eGFR's persistently <60 mL/min signify possible Chronic Kidney Disease.    Anion gap 8 5 - 15  CBC with Differential     Status: Abnormal   Collection Time: 08/29/16 12:10 PM  Result Value Ref Range   WBC 4.4 4.0 - 10.5 K/uL   RBC 3.31 (L) 4.22 - 5.81 MIL/uL   Hemoglobin 10.2 (L) 13.0 - 17.0 g/dL   HCT 31.2 (L) 39.0 - 52.0 %   MCV 94.3 78.0 - 100.0 fL   MCH 30.8 26.0 - 34.0 pg   MCHC 32.7 30.0 - 36.0 g/dL   RDW 14.9 11.5 - 15.5 %   Platelets 150 150 - 400 K/uL   Neutrophils Relative % 44 %   Neutro Abs 2.0 1.7 - 7.7 K/uL   Lymphocytes Relative 29 %   Lymphs Abs 1.3 0.7 - 4.0 K/uL   Monocytes Relative 11 %   Monocytes Absolute 0.5 0.1 - 1.0 K/uL   Eosinophils Relative 16 %   Eosinophils Absolute 0.7 0.0 - 0.7 K/uL   Basophils Relative 0 %   Basophils Absolute 0.0 0.0 - 0.1 K/uL  CSF cell count with differential collection tube #: 1     Status: None   Collection Time: 08/29/16 12:12 PM  Result Value Ref Range   Tube # 1    Color, CSF COLORLESS COLORLESS   Appearance, CSF CLEAR CLEAR   Supernatant NOT INDICATED    RBC Count, CSF 0 0 /cu mm   WBC, CSF 1 0 - 5 /cu mm   Other Cells, CSF TOO FEW TO COUNT, SMEAR AVAILABLE FOR REVIEW     Comment: Few lymphs, rare monos.  Cryptococcal antigen, CSF     Status: None   Collection Time: 08/29/16 12:12 PM  Result Value Ref Range   Crypto Ag NEGATIVE NEGATIVE   Cryptococcal Ag Titer NOT INDICATED NOT INDICATED  Urinalysis, Routine w reflex microscopic     Status: Abnormal   Collection Time: 08/29/16  2:15 PM  Result Value Ref Range   Color, Urine YELLOW YELLOW   APPearance CLEAR CLEAR   Specific Gravity, Urine 1.022 1.005 - 1.030   pH 7.0 5.0 - 8.0   Glucose, UA NEGATIVE NEGATIVE  mg/dL   Hgb urine dipstick NEGATIVE NEGATIVE   Bilirubin Urine NEGATIVE NEGATIVE   Ketones, ur NEGATIVE NEGATIVE mg/dL   Protein, ur 30 (A) NEGATIVE mg/dL  Nitrite NEGATIVE NEGATIVE   Leukocytes, UA NEGATIVE NEGATIVE  Urine rapid drug screen (hosp performed)     Status: None   Collection Time: 08/29/16  2:15 PM  Result Value Ref Range   Opiates NONE DETECTED NONE DETECTED   Cocaine NONE DETECTED NONE DETECTED   Benzodiazepines NONE DETECTED NONE DETECTED   Amphetamines NONE DETECTED NONE DETECTED   Tetrahydrocannabinol NONE DETECTED NONE DETECTED   Barbiturates NONE DETECTED NONE DETECTED    Comment:        DRUG SCREEN FOR MEDICAL PURPOSES ONLY.  IF CONFIRMATION IS NEEDED FOR ANY PURPOSE, NOTIFY LAB WITHIN 5 DAYS.        LOWEST DETECTABLE LIMITS FOR URINE DRUG SCREEN Drug Class       Cutoff (ng/mL) Amphetamine      1000 Barbiturate      200 Benzodiazepine   741 Tricyclics       287 Opiates          300 Cocaine          300 THC              50   Urine microscopic-add on     Status: Abnormal   Collection Time: 08/29/16  2:15 PM  Result Value Ref Range   Squamous Epithelial / LPF 0-5 (A) NONE SEEN   WBC, UA 0-5 0 - 5 WBC/hpf   RBC / HPF 0-5 0 - 5 RBC/hpf   Bacteria, UA RARE (A) NONE SEEN   Casts HYALINE CASTS (A) NEGATIVE   Urine-Other SPERM PRESENT   CSF culture     Status: None (Preliminary result)   Collection Time: 08/29/16  2:38 PM  Result Value Ref Range   Specimen Description CSF    Special Requests Immunocompromised    Gram Stain      WBC PRESENT, PREDOMINANTLY MONONUCLEAR NO ORGANISMS SEEN CYTOSPIN    Culture PENDING    Report Status PENDING   Glucose, CSF     Status: None   Collection Time: 08/29/16  2:38 PM  Result Value Ref Range   Glucose, CSF 48 40 - 70 mg/dL  Protein, CSF     Status: Abnormal   Collection Time: 08/29/16  2:38 PM  Result Value Ref Range   Total  Protein, CSF 141 (H) 15 - 45 mg/dL      Component Value Date/Time   SDES CSF  08/29/2016 1438   SPECREQUEST Immunocompromised 08/29/2016 1438   CULT PENDING 08/29/2016 1438   REPTSTATUS PENDING 08/29/2016 1438   Ct Head Wo Contrast  Result Date: 08/29/2016 CLINICAL DATA:  Seizure, no pain EXAM: CT HEAD WITHOUT CONTRAST TECHNIQUE: Contiguous axial images were obtained from the base of the skull through the vertex without intravenous contrast. COMPARISON:  None. FINDINGS: Brain: No evidence of acute infarction, hemorrhage, hydrocephalus, extra-axial collection or mass lesion/mass effect. Vascular: No hyperdense vessel or unexpected calcification. Skull: No osseous abnormality. Sinuses/Orbits: There is bilateral maxillary sinus mucosal thickening. There is bilateral chronic frontoethmoidal recess sinus disease. There is left ethmoid sinus mucosal thickening. Visualized mastoid sinuses are clear. Visualized orbits demonstrate no focal abnormality. Other: None IMPRESSION: No acute intracranial pathology. Electronically Signed   By: Kathreen Devoid   On: 08/29/2016 13:55   Recent Results (from the past 240 hour(s))  CSF culture     Status: None (Preliminary result)   Collection Time: 08/29/16  2:38 PM  Result Value Ref Range Status   Specimen Description CSF  Final   Special Requests Immunocompromised  Final   Gram Stain   Final    WBC PRESENT, PREDOMINANTLY MONONUCLEAR NO ORGANISMS SEEN CYTOSPIN    Culture PENDING  Incomplete   Report Status PENDING  Incomplete      08/29/2016, 7:34 PM     LOS: 0 days    Records and images were personally reviewed where available.

## 2016-08-29 NOTE — ED Provider Notes (Signed)
[facility]-EMERGENCY DEPT Provider Note   CSN: [id number] Arrival date & time: 08/29/16  1151     History   Chief Complaint Chief Complaint  Patient presents with  . Seizures    HPI [name] [name] is a 20 y.o. male.  HPI  20 year old male with a history of HIV presents with a new onset seizure. History from patient and EMS. Patient was sitting on the couch with no complaints and started to develop pain and pressure behind his eyes. Last couple minutes and then his father said he had a 1 minute generalized seizure. Patient was postictal until arriving to the ED. Next thing patient remembers was waking up in the ED with EMS. He's presently denies headache besides this new pressure behind his eyes but then later tells the nurse that he has been taking ibuprofen and Tylenol for headache last couple days. No prior seizures. No fevers or neck pain/stiffness. Denies any weakness or numbness. Patient has not been on any HIV medicines for the last several months due to insurance issues.  Past Medical History:  Diagnosis Date  . HIV disease (HCC)   . Immune deficiency disorder [facility](HCC)     Patient Active Problem List   Diagnosis Date Noted  . Seizure (HCC) 08/29/2016    History reviewed. No pertinent surgical history.     Home Medications    Prior to Admission medications   Medication Sig Start Date End Date Taking? Authorizing Provider  doxycycline (VIBRAMYCIN) 100 MG capsule Take 1 capsule (100 mg total) by mouth 2 (two) times daily. Patient taking differently: Take 100 mg by mouth 2 (two) times daily. For 14 days 08/13/16  [facility]  fluconazole (DIFLUCAN) 100 MG tablet Take 100 mg by mouth daily. Started on 08-23-16 for 14 days   Yes Historical Provider, MD  ibuprofen (ADVIL,MOTRIN) 200 MG tablet Take 400 mg by mouth every 6 (six) hours as needed for headache or moderate pain.   Yes Historical Provider, MD  Pseudoeph-Doxylamine-DM-APAP (NYQUIL PO) Take 2 capsules by  mouth as needed (for cough and congestion).   Yes Historical Provider, MD    Family History History reviewed. No pertinent family history.  Social History Social History  Substance Use Topics  . Smoking status: Current Every Day Smoker    Types: Cigarettes  . Smokeless tobacco: Never Used  . Alcohol use No     Allergies   Patient has no known allergies.   Review of Systems Review of Systems  Constitutional: Negative for fever.  Eyes: Negative for visual disturbance.  Respiratory: Negative for shortness of breath.   Cardiovascular: Negative for chest pain.  Gastrointestinal: Negative for diarrhea and vomiting.  Musculoskeletal: Negative for neck pain and neck stiffness.  Neurological: Positive for seizures and headaches. Negative for weakness and numbness.  All other systems reviewed and are negative.    Physical Exam Updated Vital Signs BP 144/90   Pulse 96   Temp 98.7 F (37.1 C) (Oral)   Resp 19   SpO2 100%   Physical Exam  Constitutional: He is oriented to person, place, and time. He appears well-developed and well-nourished. No distress.  HENT:  Head: Normocephalic and atraumatic.  Right Ear: External ear normal.  Left Ear: External ear normal.  Nose: Nose normal.  Eyes: EOM are normal. Pupils are equal, round, and reactive to light. Right eye exhibits no discharge. Left eye exhibits no discharge.  Neck: Normal range of motion. Neck supple.  Cardiovascular: Normal rate, regular rhythm and  normal heart sounds.   Pulmonary/Chest: Effort normal and breath sounds normal.  Abdominal: Soft. There is no tenderness.  Musculoskeletal: He exhibits no edema.  Neurological: He is alert and oriented to person, place, and time.  CN 3-12 grossly intact. 5/5 strength in RUE, RLE, LLE. 4/5 in LUE. Grossly normal sensation. Normal finger to nose.   Skin: Skin is warm and dry. He is not diaphoretic.  Nursing note and vitals reviewed.    ED Treatments / Results   Labs (all labs ordered are listed, but only abnormal results are displayed) Labs Reviewed  COMPREHENSIVE METABOLIC PANEL - Abnormal; Notable for the following:       Result Value   Potassium 3.2 (*)    Total Protein 9.0 (*)    Albumin 3.4 (*)    AST 54 (*)    All other components within normal limits  CBC WITH DIFFERENTIAL/PLATELET - Abnormal; Notable for the following:    RBC 3.31 (*)    Hemoglobin 10.2 (*)    HCT 31.2 (*)    All other components within normal limits  PROTEIN, CSF - Abnormal; Notable for the following:    Total  Protein, CSF 141 (*)    All other components within normal limits  URINALYSIS, ROUTINE W REFLEX MICROSCOPIC (NOT AT [facility]) - Abnormal; Notable for the following:    Protein, ur 30 (*)    All other components within normal limits  URINE MICROSCOPIC-ADD ON - Abnormal; Notable for the following:    Squamous Epithelial / LPF 0-5 (*)    Bacteria, UA RARE (*)    Casts HYALINE CASTS (*)    All other components within normal limits  CSF CULTURE  CSF CELL COUNT WITH DIFFERENTIAL  GLUCOSE, CSF  CRYPTOCOCCAL ANTIGEN, CSF  RAPID URINE DRUG SCREEN, HOSP PERFORMED  HERPES SIMPLEX VIRUS(HSV) DNA BY PCR  T-HELPER CELLS (CD4) COUNT (NOT AT [facility])    EKG  EKG Interpretation  Date/Time:  Monday August 29 2016 11:51:54 EST Ventricular Rate:  101 PR Interval:    QRS Duration: 90 QT Interval:  346 QTC Calculation: 449 R Axis:   38 Text Interpretation:  Sinus tachycardia LVH by voltage Borderline T abnormalities, lateral leads Anterior ST elevation, probably due to LVH No old tracing to compare Confirmed by Ryaan Vanwagoner MD, Takiya Belmares [phone]) on 08/29/2016 12:16:00 PM       Radiology Ct Head Wo Contrast  Result Date: 08/29/2016 CLINICAL DATA:  Seizure, no pain EXAM: CT HEAD WITHOUT CONTRAST TECHNIQUE: Contiguous axial images were obtained from the base of the skull through the vertex without intravenous contrast. COMPARISON:  None. FINDINGS: Brain: No evidence of acute  infarction, hemorrhage, hydrocephalus, extra-axial collection or mass lesion/mass effect. Vascular: No hyperdense vessel or unexpected calcification. Skull: No osseous abnormality. Sinuses/Orbits: There is bilateral maxillary sinus mucosal thickening. There is bilateral chronic frontoethmoidal recess sinus disease. There is left ethmoid sinus mucosal thickening. Visualized mastoid sinuses are clear. Visualized orbits demonstrate no focal abnormality. Other: None IMPRESSION: No acute intracranial pathology. Electronically Signed   By: Elige Ko   On: 08/29/2016 13:55    Procedures .Lumbar Puncture Date/Time: 08/29/2016 2:42 PM Performed by: Pricilla Loveless Authorized by: Pricilla Loveless   Consent:    Consent obtained:  Verbal   Consent given by:  Patient   Risks discussed:  Bleeding, headache, nerve damage, infection, pain and repeat procedure Pre-procedure details:    Procedure purpose:  Diagnostic   Preparation: Patient was prepped and draped in usual sterile fashion  Anesthesia (see MAR for exact dosages):    Anesthesia method:  Local infiltration   Local anesthetic:  Lidocaine 2% WITH epi Procedure details:    Lumbar space:  L4-L5 interspace   Patient position:  L lateral decubitus   Needle gauge:  20   Ultrasound guidance: no     Number of attempts:  3   Opening pressure (cm H2O):  19   Closing pressure (cm H2O):  15   Fluid appearance:  Blood-tinged then clearing   Tubes of fluid:  4   Total volume (ml):  4 Post-procedure:    Puncture site:  Adhesive bandage applied   Patient tolerance of procedure:  Tolerated well, no immediate complications Comments:     Patient was repositioned (shoulders not aligned), LP successful when better positioned   (including critical care time)  Medications Ordered in ED Medications  lidocaine-EPINEPHrine (XYLOCAINE W/EPI) 2 %-1:200000 (PF) injection 20 mL (20 mLs Intradermal Given 08/29/16 1415)  potassium chloride SA (K-DUR,KLOR-CON) CR  tablet 40 mEq (40 mEq Oral Given 08/29/16 1623)     Initial Impression / Assessment and Plan / ED Course  I have reviewed the triage vital signs and the nursing notes.  Pertinent labs & imaging results that were available during my care of the patient were reviewed by me and considered in my medical decision making (see chart for details).  Clinical Course as of Aug 29 1745  Mon Aug 29, 2016  1202 D/w Dr. Amada JupiterKirkpatrick, will need MRI w/ and w/o in addition to CT and labs but also LP with HSV and crypto. 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  [SG]  1444 LP successful, send for MRI while awaiting LP results  [SG]    Clinical Course User Index [SG] Pricilla LovelessScott Sandrika Schwinn, MD    Patient to be admitted to internal medicine teaching service. Stable, no further seizures. However he is AIDS based on recent  CD4s and AIDS defining illness. Not on treatment. Admit for seizure workup, f/u LP results and MRI  Final Clinical Impressions(s) / ED Diagnoses   Final diagnoses:  Seizure [facility](HCC)    New Prescriptions New Prescriptions   No medications on file     Pricilla LovelessScott Yudith Norlander, MD 08/29/16 1747

## 2016-08-30 ENCOUNTER — Telehealth: Payer: Self-pay | Admitting: *Deleted

## 2016-08-30 ENCOUNTER — Inpatient Hospital Stay (HOSPITAL_COMMUNITY): Payer: Medicaid Other

## 2016-08-30 ENCOUNTER — Ambulatory Visit: Payer: Self-pay | Admitting: *Deleted

## 2016-08-30 DIAGNOSIS — B20 Human immunodeficiency virus [HIV] disease: Secondary | ICD-10-CM

## 2016-08-30 DIAGNOSIS — R569 Unspecified convulsions: Principal | ICD-10-CM

## 2016-08-30 DIAGNOSIS — M542 Cervicalgia: Secondary | ICD-10-CM

## 2016-08-30 LAB — BASIC METABOLIC PANEL
Anion gap: 8 (ref 5–15)
BUN: 8 mg/dL (ref 6–20)
CO2: 27 mmol/L (ref 22–32)
Calcium: 9 mg/dL (ref 8.9–10.3)
Chloride: 103 mmol/L (ref 101–111)
Creatinine, Ser: 0.73 mg/dL (ref 0.61–1.24)
GFR calc Af Amer: >60 mL/min (ref >60)
GFR calc non Af Amer: >60 mL/min (ref >60)
Glucose, Bld: 84 mg/dL (ref 65–99)
Potassium: 3.6 mmol/L (ref 3.5–5.1)
Sodium: 138 mmol/L (ref 135–145)

## 2016-08-30 LAB — CBC
HCT: 29.3 % — ABNORMAL LOW (ref 39.0–52.0)
Hemoglobin: 9.5 g/dL — ABNORMAL LOW (ref 13.0–17.0)
MCH: 30.4 pg (ref 26.0–34.0)
MCHC: 32.4 g/dL (ref 30.0–36.0)
MCV: 93.6 fL (ref 78.0–100.0)
Platelets: 133 10*3/uL — ABNORMAL LOW (ref 150–400)
RBC: 3.13 MIL/uL — ABNORMAL LOW (ref 4.22–5.81)
RDW: 14.7 % (ref 11.5–15.5)
WBC: 2.9 10*3/uL — ABNORMAL LOW (ref 4.0–10.5)

## 2016-08-30 LAB — HERPES SIMPLEX VIRUS(HSV) DNA BY PCR
HSV 1 DNA: NEGATIVE
HSV 2 DNA: NEGATIVE

## 2016-08-30 LAB — T-HELPER CELLS (CD4) COUNT (NOT AT ARMC)
CD4 % Helper T Cell: 1 % — ABNORMAL LOW (ref 33–55)
CD4 T Cell Abs: 10 /uL — ABNORMAL LOW (ref 400–2700)

## 2016-08-30 LAB — RPR: RPR Ser Ql: NONREACTIVE

## 2016-08-30 MED ORDER — PNEUMOCOCCAL VAC POLYVALENT 25 MCG/0.5ML IJ INJ
0.5000 mL | INJECTION | INTRAMUSCULAR | Status: AC
  Administered 2016-08-31: 0.5 mL via INTRAMUSCULAR
  Filled 2016-08-30: qty 0.5

## 2016-08-30 MED ORDER — INFLUENZA VAC SPLIT QUAD 0.5 ML IM SUSY
0.5000 mL | PREFILLED_SYRINGE | INTRAMUSCULAR | Status: AC
  Administered 2016-08-31: 0.5 mL via INTRAMUSCULAR
  Filled 2016-08-30: qty 0.5

## 2016-08-30 MED ORDER — LEVETIRACETAM 500 MG PO TABS
500.0000 mg | ORAL_TABLET | Freq: Two times a day (BID) | ORAL | Status: DC
  Administered 2016-08-30 – 2016-09-01 (×5): 500 mg via ORAL
  Filled 2016-08-30 (×5): qty 1

## 2016-08-30 NOTE — Progress Notes (Signed)
   Subjective: Patient feels well today and is at his baseline mentation. No further seizure activity overnight.  Objective:  Vital signs in last 24 hours: Vitals:   08/29/16 1730 08/29/16 1855 08/29/16 2244 08/30/16 0624  BP: 144/90 (!) 147/77 (!) 137/97 (!) 142/105  Pulse: 96 86 86 84  Resp:  18  16  Temp:  98.9 F (37.2 C) 99 F (37.2 C) 98.4 F (36.9 C)  TempSrc:  Oral Oral Oral  SpO2: 100% 100% 100% 100%  Weight:  162 lb 14.4 oz (73.9 kg)    Height:  6\' 7"  (2.007 m)     Physical Exam Constitutional: NAD, appears comfortable Cardiovascular: RRR, no murmurs, rubs, or gallops.  Pulmonary/Chest: CTAB, no wheezes, rales, or rhonchi.  Abdominal: Soft, non tender, non distended. +BS.  Extremities: Warm and well perfused. Distal pulses intact. No edema.  Neurological: A&Ox3, CN II - XII grossly intact.  Skin: No rashes or erythema  Psychiatric: Normal mood and affect  Assessment/Plan:  Seizure: In the setting of untreated HIV and an undetectable CD4 count. CSF appears aseptic on lumbar puncture and opening pressure was normal. CT head was negative for any acute intracranial abnormalities. MRI with multiple bilateral, symmetric enhancing abnormalities in the perisylvian frontal lobes, insula, and temporal lobes as well as increased signal within the caudate nuclei and putamen. Additionally, there is abnormal signal in subcortical white matter in the medial temporal lobes and external capsules. Discussed findings today with neuroradiology which are all nonspecific. Given his normal LP infectious etiology seems less likely. There are no mass like lesions making toxoplasma unlikely. These findings could be consistent with a toxic metabolic process, however his BMP only notible for mild hypokalemia (3.2) and he is now A&O x 3 at his baseline. No focal deficits on exam. UDS is negative. 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 Findings are also not consistent with post ictal changes. EEG this admission was negative. Recommend follow up MRI in 1-2 weeks to assess for resolution or progression of abnormalities.  -- ID consult, appreciate recommendations  -- Neurology consult today; appreciate recs -- Added CSF cytology per neurology request  -- Will likely start Keppra 500 mg BID per neurology  -- HIV genotype  -- F/u MRI 1-2 weeks -- CSF cultures pending -- HSV PCR pending -- JC virus PCR pending -- EBV PCR pending  -- CMV PCR pending -- GC/Chlamydia urine probe - needs to be collected -- RPR non reactive  -- Blood cultures x 2 -- Hep C pending  -- Seizure precautions  -- Ativan prn for seizure activity  -- Bactrim daily for ppx   Hypokalemia: Resolved  FEN: Discontinued fluids, replete lytes prn, regular diet VTE ppx: Lovenox  Code Status: FULL  Dispo: Anticipated discharge today pending consult recommendations.   [name] Pollarolyn Calypso Hagarty, MD 08/30/2016, 10:55 AM Pager: [phone]

## 2016-08-30 NOTE — Procedures (Signed)
HPI: 20 y/o with seizure  TECHNICAL SUMMARY:  A multichannel referential and bipolar montage EEG using the standard international 10-20 system was performed on the patient described as awake, drowsy, and asleep.  The dominant background activity consists of 9-10 hertz activity seen most prominantly over the posterior head region.  The backgound activity is reactive to eye opening and closing procedures.  Low voltage fast (beta) activity is distributed symmetrically and maximally over the anterior head regions.  ACTIVATION:  Stepwise photic stimulation at 4-20 flashes per second was performed and did not elicit any abnormal waveforms.  Hyperventilation was not performed.  EPILEPTIFORM ACTIVITY:  There were no spikes, sharp waves or paroxysmal activity.  SLEEP:  Much of the recording is spent in stage I and stage II sleep architecture  CARDIAC:  The EKG lead revealed a regular sinus rhythm.  IMPRESSION:  This is a normal EEG for the patients stated age.  There were no focal, hemispheric or lateralizing features.  No epileptiform activity was recorded.  A normal EEG does not exclude the diagnosis of a seizure disorder and if seizure remains high on the list of differential diagnosis, an ambulatory EEG may be of value.  Clinical correlation is required.

## 2016-08-30 NOTE — Progress Notes (Signed)
EEG completed, results pending. 

## 2016-08-30 NOTE — Consult Note (Signed)
NEURO HOSPITALIST CONSULT NOTE   Requestig physician: Dr. Heide Spark   Reason for Consult: seizure   History obtained from:  Patient     HPI:                                                                                                                                          [name] [name] is an 20 y.o. male with a PMH of HIV currently on no medication with an undetectable cd4 count presents to the hospital via EMS after experiencing a seizure. The patient states he had been experiencing bitemporal pain located behind his eyes since Saturday. He had been using ibuprofen with some relief. Today, the patient had been watching TV when he experienced the eye pain and had an urge to fall asleep. He then awoke in the ambulance. His father states that at 930am, he found him lying on the floor convulsing. He describes the seizure as a full body contraction that involved shaking, foaming of the mouth and eyes rolling back. The patient states he became aware of the situation after about 20 mins when he arrived at the hospital.   Patient states he is up to date on his vaccinations, but hasn't received his flu shot.   The patient states his FH is only notable for HTN in his Grandmother and uncle. No h/o of seizure  While in the ED D MRI was ordered which showed gray matter T2 FLAIR hyperintensity signal abnormality in the perisylvian frontal lobes, insula and temporal lobes as well as increased signal within the caudate nuclei and putamen. In addition there is some abnormal signal in the subcortical white matter and medial temporal lobes and external capsule. There is concerned this may represent seizure related activity and/or infectious, inflammatory, limbic encephalitis. LP was performed while in the ED and cytology was sent. Cytology is currently being tested. CSF culture was negative. He has had protein was 141. CSF glucose was 48.   Currently patient still has mild headache. And is  complaining of some discomfort where the LP was performed. No further seizures have occurred while in the hospital. Currently he is on no antiepileptic medications and doing well.  Past Medical History:  Diagnosis Date  . Hepatitis B immune   . HIV disease (HCC)   . Immune deficiency disorder (HCC)   . 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 Syphilis     History reviewed. No pertinent surgical history.  History reviewed. No pertinent family history.    Social History:  reports that he has been smoking Cigarettes.  He has never used smokeless tobacco. He reports that he uses drugs, including Marijuana. He reports that he does not drink alcohol.  No Known Allergies  MEDICATIONS:  Prior to Admission:  Prescriptions Prior to Admission  Medication Sig Dispense Refill Last Dose  . doxycycline (VIBRAMYCIN) 100 MG capsule Take 1 capsule (100 mg total) by mouth 2 (two) times daily. (Patient taking differently: Take 100 mg by mouth 2 (two) times daily. For 14 days) 14 capsule 0 08/28/2016 at Unknown time  . fluconazole (DIFLUCAN) 100 MG tablet Take 100 mg by mouth daily. Started on 08-23-16 for 14 days   08/28/2016  . ibuprofen (ADVIL,MOTRIN) 200 MG tablet Take 400 mg by mouth every 6 (six) hours as needed for headache or moderate pain.   08/28/2016 at Unknown time  . Pseudoeph-Doxylamine-DM-APAP (NYQUIL PO) Take 2 capsules by mouth as needed (for cough and congestion).   08/28/2016 at Unknown time   Scheduled: . enoxaparin (LOVENOX) injection  40 mg Subcutaneous Q24H  . [START ON 08/31/2016] Influenza vac split quadrivalent PF  0.5 mL Intramuscular Tomorrow-1000  . [START ON 08/31/2016] pneumococcal 23 valent vaccine  0.5 mL Intramuscular Tomorrow-1000  . sodium chloride flush  3 mL Intravenous Q12H  . sulfamethoxazole-trimethoprim  1 tablet Oral  Daily     ROS:                                                                                                                                       History obtained from the patient  General ROS: negative for - chills, fatigue, fever, night sweats, weight gain or weight loss Psychological ROS: negative for - behavioral disorder, hallucinations, memory difficulties, mood swings or suicidal ideation Ophthalmic ROS: negative for - blurry vision, double vision, eye pain or loss of vision ENT ROS: negative for - epistaxis, nasal discharge, oral lesions, sore throat, tinnitus or vertigo Allergy and Immunology ROS: negative for - hives or itchy/watery eyes Hematological and Lymphatic ROS: negative for - bleeding problems, bruising or swollen lymph nodes Endocrine ROS: negative for - galactorrhea, hair pattern changes, polydipsia/polyuria or temperature intolerance Respiratory ROS: negative for - cough, hemoptysis, shortness of breath or wheezing Cardiovascular ROS: negative for - chest pain, dyspnea on exertion, edema or irregular heartbeat Gastrointestinal ROS: negative for - abdominal pain, diarrhea, hematemesis, nausea/vomiting or stool incontinence Genito-Urinary ROS: negative for - dysuria, hematuria, incontinence or urinary frequency/urgency Musculoskeletal ROS: negative for - joint swelling or muscular weakness Neurological ROS: as noted in HPI Dermatological ROS: negative for rash and skin lesion changes   Blood pressure (!) 142/105, pulse 84, temperature 98.4 F (36.9 C), temperature source Oral, resp. rate 16, height 6\' 7"  (2.007 m), weight 73.9 kg (162 lb 14.4 oz), SpO2 100 %.   Neurologic Examination:  HEENT-  Normocephalic, no lesions, without obvious abnormality.  Normal external eye and conjunctiva.  Normal TM's bilaterally.  Normal auditory canals and external ears. Normal  external nose, mucus membranes and septum.  Normal pharynx. Cardiovascular- S1, S2 normal, pulses palpable throughout   Lungs- chest clear, no wheezing, rales, normal symmetric air entry Abdomen- normal findings: bowel sounds normal Extremities- no joint deformities, effusion, or inflammation Lymph-no adenopathy palpable Musculoskeletal-no joint tenderness, deformity or swelling Skin-warm and dry, no hyperpigmentation, vitiligo, or suspicious lesions  Neurological Examination Mental Status: Alert, oriented, thought content appropriate.  Speech fluent without evidence of aphasia.  Able to follow 3 step commands without difficulty. Cranial Nerves: II: Discs flat bilaterally; Visual fields grossly normal, pupils equal, round, reactive to light and accommodation III,IV, VI: ptosis not present, extra-ocular motions intact bilaterally V,VII: smile symmetric, facial light touch sensation normal bilaterally VIII: hearing normal bilaterally IX,X: uvula rises symmetrically XI: bilateral shoulder shrug XII: midline tongue extension Motor: Right : Upper extremity   5/5    Left:     Upper extremity   5/5  Lower extremity   5/5     Lower extremity   5/5 Tone and bulk:normal tone throughout; no atrophy noted Sensory: Pinprick and light touch intact throughout, bilaterally Deep Tendon Reflexes: 2+ and symmetric throughout Plantars: Right: downgoing   Left: downgoing Cerebellar: normal finger-to-nose and normal heel-to-shin test Gait: Not tested      Lab Results: Basic Metabolic Panel:  Recent Labs Lab 08/29/16 1210 08/29/16 1958 08/30/16 0410  NA 139  --  138  K 3.2*  --  3.6  CL 103  --  103  CO2 28  --  27  GLUCOSE 99  --  84  BUN 10  --  8  CREATININE 0.75  --  0.73  CALCIUM 9.3  --  9.0  MG  --  2.2  --     Liver Function Tests:  Recent Labs Lab 08/29/16 1210  AST 54*  ALT 55  ALKPHOS 72  BILITOT 0.5  PROT 9.0*  ALBUMIN 3.4*   No results for input(s): LIPASE,  AMYLASE in the last 168 hours. No results for input(s): AMMONIA in the last 168 hours.  CBC:  Recent Labs Lab 08/29/16 1210 08/30/16 0410  WBC 4.4 2.9*  NEUTROABS 2.0  --   HGB 10.2* 9.5*  HCT 31.2* 29.3*  MCV 94.3 93.6  PLT 150 133*    Cardiac Enzymes: No results for input(s): CKTOTAL, CKMB, CKMBINDEX, TROPONINI in the last 168 hours.  Lipid Panel: No results for input(s): CHOL, TRIG, HDL, CHOLHDL, VLDL, LDLCALC in the last 168 hours.  CBG: No results for input(s): GLUCAP in the last 168 hours.  Microbiology: Results for orders placed or performed during the hospital encounter of 08/29/16  CSF culture     Status: None (Preliminary result)   Collection Time: 08/29/16  2:38 PM  Result Value Ref Range Status   Specimen Description CSF  Final   Special Requests Immunocompromised  Final   Gram Stain   Final    WBC PRESENT, PREDOMINANTLY MONONUCLEAR NO ORGANISMS SEEN CYTOSPIN    Culture NO GROWTH < 24 HOURS  Final   Report Status PENDING  Incomplete    Coagulation Studies: No results for input(s): LABPROT, INR in the last 72 hours.  Imaging: Dg Chest 2 View  Result Date: 08/29/2016 CLINICAL DATA:  20 y/o  M; HIV and seizure. EXAM: CHEST  2 VIEW COMPARISON:  08/13/2016 chest radiograph. FINDINGS: The  heart size and mediastinal contours are within normal limits and stable. Both lungs are clear. The visualized skeletal structures are unremarkable. IMPRESSION: No active cardiopulmonary disease. Electronically Signed   By: Mitzi Hansen M.D.   On: 08/29/2016 22:27   Ct Head Wo Contrast  Result Date: 08/29/2016 CLINICAL DATA:  Seizure, no pain EXAM: CT HEAD WITHOUT CONTRAST TECHNIQUE: Contiguous axial images were obtained from the base of the skull through the vertex without intravenous contrast. COMPARISON:  None. FINDINGS: Brain: No evidence of acute infarction, hemorrhage, hydrocephalus, extra-axial collection or mass lesion/mass effect. Vascular: No hyperdense  vessel or unexpected calcification. Skull: No osseous abnormality. Sinuses/Orbits: There is bilateral maxillary sinus mucosal thickening. There is bilateral chronic frontoethmoidal recess sinus disease. There is left ethmoid sinus mucosal thickening. Visualized mastoid sinuses are clear. Visualized orbits demonstrate no focal abnormality. Other: None IMPRESSION: No acute intracranial pathology. Electronically Signed   By: Elige Ko   On: 08/29/2016 13:55   Mr Brain W And Wo Contrast  Result Date: 08/29/2016 CLINICAL DATA:  20 y/o  M; history of AIDS with first time seizure. EXAM: MRI HEAD WITHOUT AND WITH CONTRAST TECHNIQUE: Multiplanar, multiecho pulse sequences of the brain and surrounding structures were obtained without and with intravenous contrast. CONTRAST:  20mL MULTIHANCE GADOBENATE DIMEGLUMINE 529 MG/ML IV SOLN COMPARISON:  08/29/2016 CT head FINDINGS: Brain: No diffusion restriction to suggest acute or early subacute infarct. No abnormal susceptibility to indicate intracranial hemorrhage. Motion degraded postcontrast sequences. No abnormal enhancement of the brain identified. No focal mass effect. No heterotopia or disorder cortical formation identified. Hippocampi by are symmetric in size and signal bilaterally. There is T2 FLAIR hyperintense signal abnormality within the perisylvian frontal lobes, insula, and superior temporal lobe cortices involving the gray matter predominantly with increased signal also found in subcortical white matter of the medial temporal lobes and within the external capsules. Additionally, there is T2 FLAIR hyperintense signal within the caudate nuclei and putamen in the basal ganglia. There is faint increased T2 FLAIR hyperintense signal within the thalamus. Vascular: Normal flow voids. Skull and upper cervical spine: Normal marrow signal. Sinuses/Orbits: Moderate diffuse paranasal sinus disease and bifrontal opacification. Other: None. IMPRESSION: 1. Gray matter T2  FLAIR hyperintense signal abnormality in the perisylvian frontal lobes, insula, and temporal lobes as well as increased signal within the caudate nuclei and putamen. Addition, there is abnormal signal in subcortical white matter in the medial temporal lobes and external capsules. This may represent seizure related activity and/or infectious, inflammatory, limbic encephalitis. No specific findings of neoplasm or opportunistic infection such as toxoplasmosis or progressive multifocal leukoencephalopathy. Follow-up MRI to ensure resolution of signal abnormality is recommended. 2. No structural cause of seizures identified. These results will be called to the ordering clinician or representative by the Radiologist Assistant, and communication documented in the PACS or zVision Dashboard. Electronically Signed   By: Mitzi Hansen M.D.   On: 08/29/2016 23:04   EEG - This is a normal EEG for the patients stated age. There were no focal, hemispheric or lateralizing features. No epileptiform activity was recorded. A normal EEG does not exclude the diagnosis of a seizure disorder and if seizure remains high on the list of differential diagnosis, an ambulatory EEG may be of value.    Assessment and plan per attending neurologist  Felicie Morn PA-C [facility] [phone]  08/30/2016, 1:45 PM   Assessment/Plan: 20 yo M With new onset seizures and abnormal brain MRI. Differntial includes lymphoma, atypical viral encephalitis(much less likely given  results of LP). There have been some case reports of similar pictures in drug use and concomitant aids, though unclear exactly why this occurs. This is a very unusual imaging picture.   1) Keppra 500mg  BID 2) Cytology, if this sample is negative, would repeat for larger volume 3) Will follow.   Ritta SlotMcNeill Benjamin Merrihew, MD [facility] [phone]  If 7pm- 7am, please page neurology on call as listed in AMION.

## 2016-08-30 NOTE — Progress Notes (Signed)
INFECTIOUS DISEASE PROGRESS NOTE  ID: [name] [name] is a 20 y.o. male with  Active Problems:   Seizure (HCC)  Subjective: Some neck pain.  Wants to go home.   Abtx:  Anti-infectives    Start     Dose/Rate Route Frequency Ordered Stop   08/29/16 2100  sulfamethoxazole-trimethoprim (BACTRIM DS,SEPTRA DS) 800-160 MG per tablet 1 tablet     1 tablet Oral Daily 08/29/16 1941        Medications:  Scheduled: . enoxaparin (LOVENOX) injection  40 mg Subcutaneous Q24H  . [START ON 08/31/2016] Influenza vac split quadrivalent PF  0.5 mL Intramuscular Tomorrow-1000  . levETIRAcetam  500 mg Oral BID  . [START ON 08/31/2016] pneumococcal 23 valent vaccine  0.5 mL Intramuscular Tomorrow-1000  . sodium chloride flush  3 mL Intravenous Q12H  . sulfamethoxazole-trimethoprim  1 tablet Oral Daily    Objective: Vital signs in last 24 hours: Temp:  [98.4 F (36.9 C)-99 F (37.2 C)] 98.4 F (36.9 C) (12/05 0624) Pulse Rate:  [84-97] 84 (12/05 0624) Resp:  [16-23] 16 (12/05 0624) BP: (128-152)/(67-105) 142/105 (12/05 0624) SpO2:  [100 %] 100 % (12/05 0624) Weight:  [73.9 kg (162 lb 14.4 oz)] 73.9 kg (162 lb 14.4 oz) (12/04 1855)   General appearance: alert, cooperative and no distress Neck: no adenopathy and supple, symmetrical, trachea midline Resp: clear to auscultation bilaterally Cardio: regular rate and rhythm GI: normal findings: bowel sounds normal and soft, non-tender Neurologic: Grossly normal  No meningisumus  Lab Results  Recent Labs  08/29/16 1210 08/30/16 0410  WBC 4.4 2.9*  HGB 10.2* 9.5*  HCT 31.2* 29.3*  NA 139 138  K 3.2* 3.6  CL 103 103  CO2 28 27  BUN 10 8  CREATININE 0.75 0.73   Liver Panel  Recent Labs  08/29/16 1210  PROT 9.0*  ALBUMIN 3.4*  AST 54*  ALT 55  ALKPHOS 72  BILITOT 0.5   Sedimentation Rate No results for input(s): ESRSEDRATE in the last 72 hours. C-Reactive Protein No results for input(s): CRP in the last 72  hours.  Microbiology: Recent Results (from the past 240 hour(s))  CSF culture     Status: None (Preliminary result)   Collection Time: 08/29/16  2:38 PM  Result Value Ref Range Status   Specimen Description CSF  Final   Special Requests Immunocompromised  Final   Gram Stain   Final    WBC PRESENT, PREDOMINANTLY MONONUCLEAR NO ORGANISMS SEEN CYTOSPIN    Culture NO GROWTH < 24 HOURS  Final   Report Status PENDING  Incomplete  Culture, blood (routine x 2)     Status: None (Preliminary result)   Collection Time: 08/29/16  8:00 PM  Result Value Ref Range Status   Specimen Description BLOOD RIGHT ANTECUBITAL  Final   Special Requests BOTTLES DRAWN AEROBIC AND ANAEROBIC 5CC  Final   Culture NO GROWTH < 24 HOURS  Final   Report Status PENDING  Incomplete  Culture, blood (routine x 2)     Status: None (Preliminary result)   Collection Time: 08/29/16  8:09 PM  Result Value Ref Range Status   Specimen Description BLOOD RIGHT ANTECUBITAL  Final   Special Requests BOTTLES DRAWN AEROBIC AND ANAEROBIC 5CC  Final   Culture NO GROWTH < 24 HOURS  Final   Report Status PENDING  Incomplete    Studies/Results: Dg Chest 2 View  Result Date: 08/29/2016 CLINICAL DATA:  20 y/o  M; HIV and seizure. EXAM: CHEST  2 VIEW COMPARISON:  08/13/2016 chest radiograph. FINDINGS: The heart size and mediastinal contours are within normal limits and stable. Both lungs are clear. The visualized skeletal structures are unremarkable. IMPRESSION: No active cardiopulmonary disease. Electronically Signed   By: Mitzi Hansen M.D.   On: 08/29/2016 22:27   Ct Head Wo Contrast  Result Date: 08/29/2016 CLINICAL DATA:  Seizure, no pain EXAM: CT HEAD WITHOUT CONTRAST TECHNIQUE: Contiguous axial images were obtained from the base of the skull through the vertex without intravenous contrast. COMPARISON:  None. FINDINGS: Brain: No evidence of acute infarction, hemorrhage, hydrocephalus, extra-axial collection or mass  lesion/mass effect. Vascular: No hyperdense vessel or unexpected calcification. Skull: No osseous abnormality. Sinuses/Orbits: There is bilateral maxillary sinus mucosal thickening. There is bilateral chronic frontoethmoidal recess sinus disease. There is left ethmoid sinus mucosal thickening. Visualized mastoid sinuses are clear. Visualized orbits demonstrate no focal abnormality. Other: None IMPRESSION: No acute intracranial pathology. Electronically Signed   By: Elige Ko   On: 08/29/2016 13:55   Mr Brain W And Wo Contrast  Result Date: 08/29/2016 CLINICAL DATA:  20 y/o  M; history of AIDS with first time seizure. EXAM: MRI HEAD WITHOUT AND WITH CONTRAST TECHNIQUE: Multiplanar, multiecho pulse sequences of the brain and surrounding structures were obtained without and with intravenous contrast. CONTRAST:  20mL MULTIHANCE GADOBENATE DIMEGLUMINE 529 MG/ML IV SOLN COMPARISON:  08/29/2016 CT head FINDINGS: Brain: No diffusion restriction to suggest acute or early subacute infarct. No abnormal susceptibility to indicate intracranial hemorrhage. Motion degraded postcontrast sequences. No abnormal enhancement of the brain identified. No focal mass effect. No heterotopia or disorder cortical formation identified. Hippocampi by are symmetric in size and signal bilaterally. There is T2 FLAIR hyperintense signal abnormality within the perisylvian frontal lobes, insula, and superior temporal lobe cortices involving the gray matter predominantly with increased signal also found in subcortical white matter of the medial temporal lobes and within the external capsules. Additionally, there is T2 FLAIR hyperintense signal within the caudate nuclei and putamen in the basal ganglia. There is faint increased T2 FLAIR hyperintense signal within the thalamus. Vascular: Normal flow voids. Skull and upper cervical spine: Normal marrow signal. Sinuses/Orbits: Moderate diffuse paranasal sinus disease and bifrontal opacification.  Other: None. IMPRESSION: 1. Gray matter T2 FLAIR hyperintense signal abnormality in the perisylvian frontal lobes, insula, and temporal lobes as well as increased signal within the caudate nuclei and putamen. Addition, there is abnormal signal in subcortical white matter in the medial temporal lobes and external capsules. This may represent seizure related activity and/or infectious, inflammatory, limbic encephalitis. No specific findings of neoplasm or opportunistic infection such as toxoplasmosis or progressive multifocal leukoencephalopathy. Follow-up MRI to ensure resolution of signal abnormality is recommended. 2. No structural cause of seizures identified. These results will be called to the ordering clinician or representative by the Radiologist Assistant, and communication documented in the PACS or zVision Dashboard. Electronically Signed   By: Mitzi Hansen M.D.   On: 08/29/2016 23:04     Assessment/Plan: Seizure  EEG normal  MRI shows grey matter changes.   Multiple CSF studies pending. Have added CSF EBV PCR  Spoke with pathology, they are sending for heme eval.   Will order flow and cytology  AIDS  Meeting with ADAP assistance from our clinic.   Greatly appreciate the outstanding care of IMTS and Neuro   Total days of antibiotics: 0 CSF: HSV, JC, EBV pending.          Johny Sax Infectious Diseases (pager) [phone])  [phone] [email or web address] 08/30/2016, 3:37 PM  LOS: 1 day

## 2016-08-30 NOTE — Progress Notes (Signed)
Spoke with Annice PihJackie, financial counselor at ID clinic [phone] to request ADAP and follow up for new patient. Annice PihJackie to contact Dr Ninetta LightsHatcher for approval and orders to send [employer] Counselor from office to set up ADAP while in hospital since CD4 <10.

## 2016-08-30 NOTE — Care Management Note (Addendum)
Case Management Note  Patient Details  Name: [name] [name] MRN: [id number] Date of Birth: Aug 03, 1996  Subjective/Objective:                 Spoke with patient at the bedside, he states that he recently moved to [place] to live with his dad and his stepmom. He wants to transfer services to doctors in [place]. Patient admitted with new seizure, hx 042. States he has been on Complera for HIV in past and was on ADAP but [place] [facility] [place] [place] when he was diagnosed ~4-5 years ago and his ADAP expired. CM explained Internal Medicine clinic and patient would like to follow up with this group of doctors.    Action/Plan:  Establish care with Internal Medicine clinic for PCP, and [facility] for HIV management and ADAP program.  Patient provided with coupons through [employer] for keppra and bactrim.   Expected Discharge Date:                  Expected Discharge Plan:  Home/Self Care  In-House Referral:     Discharge planning Services  CM Consult, Medication Assistance  Post Acute Care Choice:    Choice offered to:     DME Arranged:    DME Agency:     HH Arranged:    HH Agency:     Status of Service:  In process, will continue to follow  If discussed at Long Length of Stay Meetings, dates discussed:    Additional Comments:  Lawerance SabalDebbie Ryon Layton, RN 08/30/2016, 12:53 PM

## 2016-08-30 NOTE — Telephone Encounter (Signed)
Call from Eunice Blaseebbie, [facility] Per Dr. Ninetta LightsHatcher ok to send Con MemosMichelle Evans, for ADAP application and Molli PoseyAmbre, RN case manager. Both are going over to see patient today. Call transferred to Rhoderick MoodyKim Marly for hospital follow up appt. Wendall MolaJacqueline [place] Delauder CMA

## 2016-08-30 NOTE — Progress Notes (Signed)
Subjective: Patient reports no new symptoms. Patient states he is slowly returning to baseline. Patient reports sleeping well and headache that has resolved.   Spoke to Neuro radiology, Neuro, and I&D. See recs below Objective: Vital signs in last 24 hours: Vitals:   08/29/16 1730 08/29/16 1855 08/29/16 2244 08/30/16 0624  BP: 144/90 (!) 147/77 (!) 137/97 (!) 142/105  Pulse: 96 86 86 84  Resp:  18  16  Temp:  98.9 F (37.2 C) 99 F (37.2 C) 98.4 F (36.9 C)  TempSrc:  Oral Oral Oral  SpO2: 100% 100% 100% 100%  Weight:  73.9 kg (162 lb 14.4 oz)    Height:  _0  (2.007 m)     Weight change:   Intake/Output Summary (Last 24 hours) at 08/30/16 1254 Last data filed at 08/30/16 1100  Gross per 24 hour  Intake              960 ml  Output              550 ml  Net              410 ml   Gen: Well appearing, thin adult male fully conversant with team. Oriented x3. HEENT: Normocephalic atraumatic. MMM. Anicteric. PERRL Cv: RRR Nl s1 s2 without mrumur Resp: Normal work of breathing. CTAB Ab: Non distended Normal bowel sounds. Non tender. No hepatomegaly or masses.  Ext: No LE Edema. 2+ distal pulses  Lab Results: Mag 2.2, TSH 3.271. Crypto antigen negative. CBC notable for WBC of 2.9, hgb of 9.5 and platelets of 133. BMP wnl. CSF HSV, JC virus and Cx pending. Blood Cx x 2 pending. RPR, Hep C, G&C pending. EBV and CMV DNA PCR pending.   Micro Results: Recent Results (from the past 240 hour(s))  CSF culture     Status: None (Preliminary result)   Collection Time: 08/29/16  2:38 PM  Result Value Ref Range Status   Specimen Description CSF  Final   Special Requests Immunocompromised  Final   Gram Stain   Final    WBC PRESENT, PREDOMINANTLY MONONUCLEAR NO ORGANISMS SEEN CYTOSPIN    Culture NO GROWTH < 24 HOURS  Final   Report Status PENDING  Incomplete   Studies/Results: CXR - The heart size and mediastinal contours are within normal limits and stable. Both lungs are clear.  The visualized skeletal structures are Unremarkable. No active cardiopulmonary disease.  CT of head - No acute intracranial pathology. Brain: No evidence of acute infarction, hemorrhage, hydrocephalus, extra-axial collection or mass lesion/mass effect. Vascular: No hyperdense vessel or unexpected calcification. Skull: No osseous abnormality.Sinuses/Orbits: There is bilateral maxillary sinus mucosal thickening. There is bilateral chronic frontoethmoidal recess sinus disease. There is left ethmoid sinus mucosal thickening. Visualized mastoid sinuses are clear. Visualized orbits demonstrate no focal abnormality.  Head MRI with Contrast - Spoke with Neuro Rad. Bilateral perisylvian hyperintensity of gray and white matter of temporal lobes extending to basal ganglia and external capsule. Consistent with a metabolic, drug, seizure, or CNS lymphoma related process. Others to include are herpes, toxoplasma, although unlikely.   EEG - This is a normal EEG for the patients stated age.  There were no focal, hemispheric or lateralizing features.  No epileptiform activity was recorded.  A normal EEG does not exclude the diagnosis of a seizure disorder and if seizure remains high on the list of differential diagnosis, an ambulatory EEG may be of value.  Clinical correlation is required.   Medications: I have  reviewed the patient's current medications. Scheduled Meds: . enoxaparin (LOVENOX) injection  40 mg Subcutaneous Q24H  . [START ON 08/31/2016] Influenza vac split quadrivalent PF  0.5 mL Intramuscular Tomorrow-1000  . [START ON 08/31/2016] pneumococcal 23 valent vaccine  0.5 mL Intramuscular Tomorrow-1000  . sodium chloride flush  3 mL Intravenous Q12H  . sulfamethoxazole-trimethoprim  1 tablet Oral Daily   Continuous Infusions: . sodium chloride 75 mL/hr (08/30/16 1219)   PRN Meds:.LORazepam Assessment/Plan: Active Problems:   Seizure ([place])  [name] [name], [name] [name] is a 20 yo M with a PMH of HIV currently on no  medication with an undetectable cd4 count presents to the hospital via EMS after experiencing a seizure.   Seizure - The patient's uncontrolled HIV, AIDS defining illness and cd4 undetectable was worrisome for a infection. The patient exhibits no signs or symptoms of infection.  No neurologic signs to suggest a intracranial process. CT confirms no acute intracranial mass effect.  No neck stiffness or photophobia to suggest meningitis although could be evident in immunocompromised. LP is aseptic. Other could be a malignancy as well such as CNS lymphoma. UDS negative. No new meds. No Family history of seizures.   - Per neuro radiology, MRI is consistent with a metabolic, drug related process, or CNS lymphoma. Although unlikely, HSV and toxo is still possible.  - Neuro is evaluating for etiology of seizure. Suggest starting Keppra 500 MID.  - Crypto negative  - f/u:  RPR, HCV, G&C, blood Cx x2.LP Cx, HSV and JC PCR. Serum CMV and EBV dna.  - Ativan 1 mg PRN for seizure> Seizure protocol  HIV  - Following with I&D  - HLA B57, HIV RNA, Cd4 count  - SMX - TMP prophylaxis given below 200 cd4.   - No azithromycin coverage for patient since starting ART with I&D soon  FENGI  - Regular diet  - 75 ml/hr NS  - DVT Pro: Lovenox 40 mg qd  Dispo  - Per neuro and I&D  - f/u appointment Dec 13th 215pm  - Today  This is a Careers information officer Note.  The care of the patient was discussed with Dr. Philipp Ovens and the assessment and plan formulated with their assistance.  Please see their attached note for official documentation of the daily encounter.   LOS: 1 day   Benn Moulder, Medical Student 08/30/2016, 12:54 PM

## 2016-08-31 ENCOUNTER — Ambulatory Visit: Payer: Self-pay | Admitting: *Deleted

## 2016-08-31 DIAGNOSIS — B20 Human immunodeficiency virus [HIV] disease: Secondary | ICD-10-CM

## 2016-08-31 LAB — CBC
HCT: 28.9 % — ABNORMAL LOW (ref 39.0–52.0)
Hemoglobin: 9.3 g/dL — ABNORMAL LOW (ref 13.0–17.0)
MCH: 29.9 pg (ref 26.0–34.0)
MCHC: 32.2 g/dL (ref 30.0–36.0)
MCV: 92.9 fL (ref 78.0–100.0)
Platelets: 140 10*3/uL — ABNORMAL LOW (ref 150–400)
RBC: 3.11 MIL/uL — ABNORMAL LOW (ref 4.22–5.81)
RDW: 14.9 % (ref 11.5–15.5)
WBC: 3.3 10*3/uL — ABNORMAL LOW (ref 4.0–10.5)

## 2016-08-31 LAB — COMPREHENSIVE METABOLIC PANEL
ALT: 45 U/L (ref 17–63)
AST: 41 U/L (ref 15–41)
Albumin: 3.3 g/dL — ABNORMAL LOW (ref 3.5–5.0)
Alkaline Phosphatase: 66 U/L (ref 38–126)
Anion gap: 9 (ref 5–15)
BUN: 8 mg/dL (ref 6–20)
CO2: 27 mmol/L (ref 22–32)
Calcium: 9 mg/dL (ref 8.9–10.3)
Chloride: 102 mmol/L (ref 101–111)
Creatinine, Ser: 0.7 mg/dL (ref 0.61–1.24)
GFR calc Af Amer: >60 mL/min (ref >60)
GFR calc non Af Amer: >60 mL/min (ref >60)
Glucose, Bld: 89 mg/dL (ref 65–99)
Potassium: 3.8 mmol/L (ref 3.5–5.1)
Sodium: 138 mmol/L (ref 135–145)
Total Bilirubin: 0.3 mg/dL (ref 0.3–1.2)
Total Protein: 8.2 g/dL — ABNORMAL HIGH (ref 6.5–8.1)

## 2016-08-31 LAB — CMV DNA, QUANTITATIVE, PCR
CMV DNA Quant: POSITIVE IU/mL
Log10 CMV Qn DNA Pl: UNDETERMINED log10 IU/mL

## 2016-08-31 LAB — HEPATITIS C ANTIBODY: HCV Ab: 0.2 s/co ratio (ref 0.0–0.9)

## 2016-08-31 MED ORDER — ACETAMINOPHEN 325 MG PO TABS
650.0000 mg | ORAL_TABLET | Freq: Four times a day (QID) | ORAL | Status: DC | PRN
  Administered 2016-08-31 – 2016-09-01 (×2): 650 mg via ORAL
  Filled 2016-08-31 (×2): qty 2

## 2016-08-31 MED ORDER — HYDROCORTISONE 1 % EX CREA
TOPICAL_CREAM | Freq: Three times a day (TID) | CUTANEOUS | Status: DC | PRN
  Filled 2016-08-31: qty 28

## 2016-08-31 NOTE — Progress Notes (Signed)
   Subjective: Patient feels well today, no complaints. No further seizure activity, inquiring about discharge.   Objective:  Vital signs in last 24 hours: Vitals:   08/29/16 2244 08/30/16 0624 08/30/16 2122 08/31/16 0457  BP: (!) 137/97 (!) 142/105 (!) 143/90 123/79  Pulse: 86 84 99 89  Resp:  16 20 18   Temp: 99 F (37.2 C) 98.4 F (36.9 C) 99 F (37.2 C) 98.2 F (36.8 C)  TempSrc: Oral Oral Oral Oral  SpO2: 100% 100% 100% 100%  Weight:      Height:        Physical Exam Constitutional: NAD, appears comfortable Cardiovascular:RRR, no murmurs, rubs, or gallops.  Pulmonary/Chest:CTAB, no wheezes, rales, or rhonchi.  Abdominal:Soft, non tender, non distended. +BS.  Extremities: Warm and well perfused. Distal pulses intact. No edema.  Neurological:A&Ox3, CN II - XII grossly intact. No deficits.  Skin: No rashes or erythema  Psychiatric:Normal mood and affect  Assessment/Plan:  Seizure: In the setting of HIV/AIDs and an undetectable CD4 count on admission. Infectious work-up thus far has been negative and CSF appears aseptic. Patient is back to his baseline without neuro or cognitive deficits and we feel that an aseptic meningitis or encephalitis is unlikely. CT head was negative but MRI showed multiple bilateral, symmetric abnormalities involving the frontal lobes, temporal lobes, caudate nuclei, and putamen. There is also abnormal signal in the subcortical white matter of the medial temporal lobes and external capsules. These findings have been discussed extensively with neurology and neuroradiology, felt to be non specific and very atypical, but concerning for lymphoma. Cytology was added on after LP per neurology request and resulted as negative today. We will plan for repeat high volume LP today for repeat cytology testing. Spoke with Neurosurgery today (Dr. Yetta BarreJones) who did not recommend biopsy at this point. Plan for f/u MRI. -- Neurology and ID following, appreciate  recommendations  -- Repeat LP today for high volume cytology -- Neurosurgery consult; appreciate recommendations - repeat MRI 1-2 weeks  -- Continue Keppra 500 mg BID -- F/u CSF cultures (No growth 2 days) and PCR (HSV -neg; JC virus, EBV, & CMV pending) -- CSF cryptococcal antigen negative -- Blood cultures no growth 24 hours x 2 -- Continue Bactrim daily for PCP prophylaxis -- Seizure precautions  -- IV ativan prn for seizures  FEN: Discontinued fluids, replete lytes prn, regular diet VTE ppx: Lovenox  Code Status: FULL  Dispo: Anticipated discharge in approximately 1 day pending high volume LP today.   [name] Pollarolyn Kyrstan Gotwalt, MD 08/31/2016, 8:53 AM Pager: [phone]

## 2016-08-31 NOTE — Progress Notes (Deleted)
Subjective: Patient has no complaints and no further seizures.   Exam: Vitals:   08/30/16 2122 08/31/16 0457  BP: (!) 143/90 123/79  Pulse: 99 89  Resp: 20 18  Temp: 99 F (37.2 C) 98.2 F (36.8 C)     Gen: In bed, NAD Resp: non-labored breathing, no acute distress Abd: soft, nt  Neuro: MS: awake, alert, interactive and appropriate WU:JWJXBCN:PERRL, EOMI Motor: MAEW, no focal weakness Sensory:intact to LT      Pertinent Labs/Diagnostics: Repeat LP obtained with 15 cc CSF obtained to send for cytology  Felicie MornDavid Smith PA-C [facility] [phone]  Impression: 20 yo M with abnormal Brain MRI in the setting of immunosuppression. The picture is not typical for lymphoma, but I think this still needs pretty high consideration. Cytology was negative, but repeated large volume LP today and sent for cytology   Recommendations: 1) Repeated LP for higher volume cytology. Awaiting results 2) May need to discuss possibility of biopsy with neurosurgery vs waiting and repeating imaging.    08/31/2016, 12:06 PM

## 2016-08-31 NOTE — Progress Notes (Signed)
Subjective: No further seizures  He denies any drug use other than occasional marijuana.   Exam: Vitals:   08/30/16 2122 08/31/16 0457  BP: (!) 143/90 123/79  Pulse: 99 89  Resp: 20 18  Temp: 99 F (37.2 C) 98.2 F (36.8 C)   Gen: In bed, NAD Resp: non-labored breathing, no acute distress Abd: soft, nt  Neuro: MS: awake, alert, interactive and appropriate WU:JWJXBCN:PERRL, EOMI Motor: MAEW, no focal weakness Sensory:intact to LT  Pertinent Labs: Cytology negative.   Impression: 20 yo M with abnormal Brain MRI in the setting of immunosuppression. The picture is not typical for lymphoma, but I think this still needs pretty high consideration. Cytology was negative, but I would favor repeating this for higher volume.   Recommendations: 1) Repeat LP for higher volume cytology.  2) May need to discuss possibility of biopsy with neurosurgery vs waiting and repeating imaging.   Ritta SlotMcNeill Amanee Iacovelli, MD [facility] [phone]  If 7pm- 7am, please page neurology on call as listed in [facility]ION.

## 2016-08-31 NOTE — Procedures (Signed)
Indication: Cytology  Risks of the procedure were dicussed with the patient including post-LP headache, bleeding, infection, weakness/numbness of legs(radiculopathy), death.  The patient  agreed and written consent was obtained.   The patient was prepped and draped, and using sterile technique a 20 gauge quinke spinal needle was inserted in the L4/5 space. The opening pressure was 15 mmHg Approximately  14 cc of CSF were obtained and sent for analysis. No complication were had. Fluid was clear. patitn was told to lay flat for 2 hours and drink fluids.   Felicie Mornavid Cathan Gearin PA-C [facility] [phone]  M-F  (8:30 am- 4 PM)  08/31/2016, 12:12 PM

## 2016-08-31 NOTE — Progress Notes (Signed)
Subjective: Patient reports no new symptoms. He states his headaches are improving and neck and back pain are doing well. Patient understood and agreed with plan to get LP down to follow up negative cytology from yesterday.   Objective: Vital signs in last 24 hours: Vitals:   08/30/16 0624 08/30/16 2122 08/31/16 0457 08/31/16 1505  BP: (!) 142/105 (!) 143/90 123/79 (!) 117/56  Pulse: 84 99 89 (!) 102  Resp: '16 20 18 18  ' Temp: 98.4 F (36.9 C) 99 F (37.2 C) 98.2 F (36.8 C)   TempSrc: Oral Oral Oral   SpO2: 100% 100% 100% 100%  Weight:      Height:       Weight change:   Intake/Output Summary (Last 24 hours) at 08/31/16 1521 Last data filed at 08/31/16 0815  Gross per 24 hour  Intake                0 ml  Output              350 ml  Net             -350 ml   Gen: Well appearing, thin adult male fully conversant with team. Oriented x3. HEENT: Normocephalic atraumatic. MMM. Anicteric. PERRL Cv: RRR Nl s1 s2 without mrumur Resp: Normal work of breathing. CTAB Ab: Non distended Normal bowel sounds. Non tender. No hepatomegaly or masses.  Neuro: oriented x 3 Ext: No LE Edema. 2+ distal pulses  Lab Results: Crypto antigen negative, HSV CSF PCR negative. Hep C neg. RPR negative. Blood Cx x 2 negative. CMV/EBV still pending. G&C pending. JC pending. Cytology initially negative. Will repeat today for more volume to ensure better test  Micro Results: Recent Results (from the past 240 hour(s))  CSF culture     Status: None (Preliminary result)   Collection Time: 08/29/16  2:38 PM  Result Value Ref Range Status   Specimen Description CSF  Final   Special Requests Immunocompromised  Final   Gram Stain   Final    WBC PRESENT, PREDOMINANTLY MONONUCLEAR NO ORGANISMS SEEN CYTOSPIN    Culture NO GROWTH 2 DAYS  Final   Report Status PENDING  Incomplete  Culture, blood (routine x 2)     Status: None (Preliminary result)   Collection Time: 08/29/16  8:00 PM  Result Value Ref Range  Status   Specimen Description BLOOD RIGHT ANTECUBITAL  Final   Special Requests BOTTLES DRAWN AEROBIC AND ANAEROBIC 5CC  Final   Culture NO GROWTH < 24 HOURS  Final   Report Status PENDING  Incomplete  Culture, blood (routine x 2)     Status: None (Preliminary result)   Collection Time: 08/29/16  8:09 PM  Result Value Ref Range Status   Specimen Description BLOOD RIGHT ANTECUBITAL  Final   Special Requests BOTTLES DRAWN AEROBIC AND ANAEROBIC 5CC  Final   Culture NO GROWTH < 24 HOURS  Final   Report Status PENDING  Incomplete   Studies/Results: CXR - The heart size and mediastinal contours are within normal limits and stable. Both lungs are clear. The visualized skeletal structures are Unremarkable. No active cardiopulmonary disease.  CT of head - No acute intracranial pathology. Brain: No evidence of acute infarction, hemorrhage, hydrocephalus, extra-axial collection or mass lesion/mass effect. Vascular: No hyperdense vessel or unexpected calcification. Skull: No osseous abnormality.Sinuses/Orbits: There is bilateral maxillary sinus mucosal thickening. There is bilateral chronic frontoethmoidal recess sinus disease. There is left ethmoid sinus mucosal thickening. Visualized mastoid sinuses are  clear. Visualized orbits demonstrate no focal abnormality.  Head MRI with Contrast - Spoke with Neuro Rad. Bilateral perisylvian hyperintensity of gray and white matter of temporal lobes extending to basal ganglia and external capsule. Consistent with a metabolic, drug, seizure, or CNS lymphoma related process. Others to include are herpes, toxoplasma, although unlikely.   EEG - This is a normal EEG for the patients stated age. There were no focal, hemispheric or lateralizing features. No epileptiform activity was recorded. A normal EEG does not exclude the diagnosis of a seizure disorder and if seizure remains high on the list of differential diagnosis, an ambulatory EEG may be of value. Clinical  correlation is required.  Moca test: 25 of 30.  Medications: I have reviewed the patient's current medications. Scheduled Meds: . enoxaparin (LOVENOX) injection  40 mg Subcutaneous Q24H  . levETIRAcetam  500 mg Oral BID  . sodium chloride flush  3 mL Intravenous Q12H  . sulfamethoxazole-trimethoprim  1 tablet Oral Daily   Continuous Infusions: PRN Meds:.LORazepam Assessment/Plan: Principal Problem:   Seizure ([place]) Active Problems:   AIDS ([place])  [name] [name], [name] [name] is a 20 yo M with a PMH of AIDS currently on no medication with an undetectable cd4 count presents to the hospital via EMS after experiencing a seizure.   Seizure - The patient's uncontrolled HIV, AIDS defining illness and cd4 undetectable was worrisome for a infection. The patient exhibits no signs or symptoms of infection.  No neurologic signs to suggest a intracranial process. CT confirms no acute intracranial mass effect.  No neck stiffness or photophobia to suggest meningitis although could be evident in immunocompromised. LP is aseptic. Other could be a malignancy as well such as CNS lymphoma. UDS negative. No new meds. No Family history of seizures. Per neuro radiology, MRI is consistent with a metabolic, drug related process, or CNS lymphoma. Although unlikely, HSV and toxo is still possible.  - neuro following  - Keppra 500 BID.  - repeat cytology  - Neurosurgery doesn't think biopsy will result in answer. Suggest repeating MRI in couple of weeks.    - f/u JC PCR. Serum CMV and EBV dna. - Ativan 1 mg PRN for seizure>Seizure protocol  AIDS - Most recent Cd4 10.   - Following with ID  - HLA B57, HIV RNA  - MoCA of 25.  - SMX - TMP prophylaxis given below 200 cd4.   - No azithromycin coverage for patient since starting ART with ID soon  Anal fissure  - ID will follow in outpatient FENGI - Regular diet - 75 ml/hr NS - DVT Pro: Lovenox 40 mg qd  Dispo  - Per neuro  - f/u appointment Dec 13th 215pm  -  Today  This is a Careers information officer Note.  The care of the patient was discussed with Dr. Philipp Ovens and the assessment and plan formulated with their assistance.  Please see their attached note for official documentation of the daily encounter.   LOS: 2 days   Benn Moulder, Medical Student 08/31/2016, 3:21 PM

## 2016-09-01 DIAGNOSIS — L89159 Pressure ulcer of sacral region, unspecified stage: Secondary | ICD-10-CM

## 2016-09-01 DIAGNOSIS — K626 Ulcer of anus and rectum: Secondary | ICD-10-CM

## 2016-09-01 LAB — COMPREHENSIVE METABOLIC PANEL
ALT: 52 U/L (ref 17–63)
AST: 48 U/L — ABNORMAL HIGH (ref 15–41)
Albumin: 3.6 g/dL (ref 3.5–5.0)
Alkaline Phosphatase: 74 U/L (ref 38–126)
Anion gap: 7 (ref 5–15)
BUN: 9 mg/dL (ref 6–20)
CO2: 28 mmol/L (ref 22–32)
Calcium: 8.9 mg/dL (ref 8.9–10.3)
Chloride: 103 mmol/L (ref 101–111)
Creatinine, Ser: 0.73 mg/dL (ref 0.61–1.24)
GFR calc Af Amer: >60 mL/min (ref >60)
GFR calc non Af Amer: >60 mL/min (ref >60)
Glucose, Bld: 96 mg/dL (ref 65–99)
Potassium: 3.9 mmol/L (ref 3.5–5.1)
Sodium: 138 mmol/L (ref 135–145)
Total Bilirubin: 0.5 mg/dL (ref 0.3–1.2)
Total Protein: 9.3 g/dL — ABNORMAL HIGH (ref 6.5–8.1)

## 2016-09-01 LAB — GC/CHLAMYDIA PROBE AMP (~~LOC~~) NOT AT ARMC
Chlamydia: NEGATIVE
Neisseria Gonorrhea: NEGATIVE

## 2016-09-01 LAB — CBC
HCT: 30.3 % — ABNORMAL LOW (ref 39.0–52.0)
Hemoglobin: 10.1 g/dL — ABNORMAL LOW (ref 13.0–17.0)
MCH: 31.2 pg (ref 26.0–34.0)
MCHC: 33.3 g/dL (ref 30.0–36.0)
MCV: 93.5 fL (ref 78.0–100.0)
Platelets: 152 10*3/uL (ref 150–400)
RBC: 3.24 MIL/uL — ABNORMAL LOW (ref 4.22–5.81)
RDW: 14.8 % (ref 11.5–15.5)
WBC: 4.2 10*3/uL (ref 4.0–10.5)

## 2016-09-01 LAB — CSF CULTURE W GRAM STAIN: Culture: NO GROWTH

## 2016-09-01 LAB — EPSTEIN BARR VRS(EBV DNA BY PCR)
EBV DNA QN by PCR: NEGATIVE copies/mL
log10 EBV DNA Qn PCR: UNDETERMINED log10 copy/mL

## 2016-09-01 MED ORDER — VALACYCLOVIR HCL 500 MG PO TABS
1000.0000 mg | ORAL_TABLET | Freq: Three times a day (TID) | ORAL | Status: DC
  Administered 2016-09-01: 1000 mg via ORAL
  Filled 2016-09-01: qty 2

## 2016-09-01 MED ORDER — SULFAMETHOXAZOLE-TRIMETHOPRIM 800-160 MG PO TABS: 1.0000 | ORAL_TABLET | Freq: Every day | ORAL | 2 refills | Status: DC

## 2016-09-01 MED ORDER — LEVETIRACETAM 500 MG PO TABS: 500.0000 mg | ORAL_TABLET | Freq: Two times a day (BID) | ORAL | 4 refills | Status: DC

## 2016-09-01 MED ORDER — VALACYCLOVIR HCL 1 G PO TABS: 1000.0000 mg | ORAL_TABLET | Freq: Three times a day (TID) | ORAL | 0 refills | Status: AC

## 2016-09-01 MED ORDER — HYDROCORTISONE 1 % EX CREA: TOPICAL_CREAM | Freq: Three times a day (TID) | CUTANEOUS | 0 refills | Status: DC | PRN

## 2016-09-01 NOTE — Discharge Summary (Signed)
Name: [name] Bakeraron Loder MRN: [id number] DOB: 03-Apr-1996 20 y.o. PCP: No Pcp Per Patient  Date of Admission: 08/29/2016 11:51 AM Date of Discharge: 09/01/2016 Attending Physician: Earl LagosNischal Narendra, MD  Discharge Diagnosis: 1. Seizure 2. AIDS 3. Anal Ulcerations  Discharge Medications:   Medication List    STOP taking these medications   doxycycline 100 MG capsule Commonly known as:  VIBRAMYCIN     TAKE these medications   fluconazole 100 MG tablet Commonly known as:  DIFLUCAN Take 100 mg by mouth daily. Started on 08-23-16 for 14 days   hydrocortisone cream 1 % Apply topically 3 (three) times daily as needed for itching.   ibuprofen 200 MG tablet Commonly known as:  ADVIL,MOTRIN Take 400 mg by mouth every 6 (six) hours as needed for headache or moderate pain.   NYQUIL PO Take 2 capsules by mouth as needed (for cough and congestion).   valACYclovir 1000 MG tablet Commonly known as:  VALTREX Take 1 tablet (1,000 mg total) by mouth 3 (three) times daily.       Disposition and follow-up:   Mr.Fintan [name] was discharged from [facility] in Stable condition.  At the hospital follow up visit please address:  1.  Seizure - Undetermined etiology with non specific MRI findings. LP was aseptic and Infectious work up negative. CSF cytology was also negative. Neurosurgery deferred biopsy to repeat MRI. Discharged on Keppra 500 BID per neurology recommendations. Please nsure no motor vehicle operation.  Monitor for BP as patient was hypertensive throughout 3 day stay and Keppra may increase BP. Plan to follow up with MRI. Repeat MRI 1-2 weeks.   - AIDS - Cd4 of 10. Not on medication currently. Will be followed with Dr. Ninetta LightsHatcher (ID). Scored MoCA of 25 in hosptial. Monitor for acute change in cognitive function. Started on SMX - TMP prophylaxis for PCP prophylaxis.    - Anal Ulcerations- Had significant non bleeding ulcerations throughout perianal region extending up  to his sacrum. Was treated with preparation H and wound care. Discharged on valacyclovir TID per ID recommendations. [facility] with [facility] in April. Has f/u appoint 12/14.  2.  Labs / imaging needed at time of follow-up: Blood pressure  3.  Pending labs/ test needing follow-up: CSF EBV PCR, CSF JC PCR, G&C  Follow-up Appointments: Follow-up Information    [facility]             . Go on 09/14/2016.   Why:  09:30 with Dr. Ninetta LightsHatcher. Please arrive 15 minutes early. Contact information: [address] [place] [phone]       [facility]. Go on 09/07/2016.   Why:  At 215. Please arrive 15 mins early to check in.  Contact information: [address] [place] [id number] [phone]       Van ClinesAquino,Karen M, MD Follow up.   Specialty:  Neurology Why:  Neurology will call you to schedule a follow up appointment for 2 weeks. If you do not hear from them by next week, please give them a call.  Contact information: [address] [place] [id number] [phone]           Hospital Course by problem list:  1. Seizure - [name] [name], Dionne is a 20 yo M with a PMH of HIVcurrently on no medication with an undetectable cd4 count presents to the hospital via EMS after experiencing a seizure. His  seizure was described as bitemporal pain located behind his eyes while watching TV when he  had an urge to fall asleep. He then awoke in the ambulance. His father described the seizure as a full body contraction that involved shaking, foaming of the mouth and eyes rolling back. The patient states he became aware of the situation after about 20 mins when he arrived at the hospital.  The patient's uncontrolled HIV, AIDS defining illness and cd4 undetectable wasworrisome for a infection although the patient exhibited no signs or symptoms of infection. No neurologic signs suggested an intracranial  process. No neck stiffness or photophobiato suggest meningitis. Initial LP was aseptic. CT confirmed no acute intracranial mass effect. MRI showed bilateral subcortical white matter and gray matter hyperintensity extending to basal ganglia that was not consistent with an infectious process. It was concerning for a metabolic, drug related process, or CNS lymphoma. A second LP was performed for large volume cytology due to insufficient volume in first Lp. Cytology was negative for malignancy. UDS negative and patient denies any drug use other than MJ. No new meds. No Family history of seizures. Patient was negative for crypto CSF antigen, HSV CSF PCR,blood Cx x2, RPR, Hep C, CSF Cx negative. CMV CSF PCR was positive but clinical picture and MRI was not consistent with CMV encephalitis. Neurology, ID and Neurosurgery were consulted and approved discharge given follow up with ID for HIV, follow up with neurology and plan for repeat MRI 1-2 weeks.   2. AIDS - Cd4 of 10. Viral load of 586,000. Recent AIDS defining illness of esophageal candidiasis. Not on medication currently. Given cd4 was below 200, SMX - TMP was started. Azithromycin was held due to ART therapy starting soon. Given high viral load and cd4 of 10, the patient was screened for HIV associated neurocognitive dementia. The patient scored MoCA of 25. Will be followed with Dr. Ninetta Lights in Id.  3. Anal Ulcerations - The patient complained of anal fissures initially in the ED but stated he had been evaluated at [facility] with an anoscopy and had a follow up appointment. The day before discharge he complained of severe itching and bleeding. A rectal exam showed extensive perianal ulcerations that were superficial and non bleeding. Wound care was consulted and provided care. ID started valacyclovir TID. The patient will follow up with [facility] on 12/14.   Discharge Vitals:   BP 137/89   Pulse 98   Temp 98.7 F (37.1 C) (Oral)   Resp 18   Ht 6\' 7"  (2.007 m)   Wt  162 lb 14.4 oz (73.9 kg)   SpO2 100%   BMI 18.35 kg/m   Pertinent Labs, Studies, and Procedures:  LP was colorless, clear, 0 RBC, 1 WBC, glucose of 48, protient of 141, Cx showed no growth. A crypto antigen and HSV DNA PCR of the CSF was negative. The EBV DNA PCR of the CSF was pending at discharge. The CMV DNA PCR CSF was positive. RPR, HepC was negative. G&C was pending at discharge. 2nd LP was negative for malignant cells on cytology. JC PCR was pending. [id number] and HIV RNA quant were pending. CXR showed no active cardiopulmonary disease. CT confirmed no acute intracranial mass effect. MRI showed bilateral subcortical white matter and gray matter hyperintensity extending to basal ganglia that was not consistent with an infectious process  Discharge Instructions: Discharge Instructions    Ambulatory referral to Neurology    Complete by:  As directed    An appointment is requested  in approximately: 2 weeks for follow up for seizure and Keppra medications.   Diet - low sodium heart healthy    Complete by:  As directed    Discharge instructions    Complete by:  As directed    You have been scheduled for follow up with infectious disease on 12/20 at 9:30am with Dr. Ninetta LightsHatcher. It is very important that you keep this appointment. You are also scheduled to follow up in our Internal Medicine clinic next Wednesday the 13th at 2:15pm for hospital follow up. Our office is located on the ground floor of the hospital on the east wing. Please arrive 15 minutes prior to your appointment to check in. Neurology will call you to schedule follow up in the next two weeks. If you do not hear from them by next week, please give their office a call. For your anal fissures/ulcerations, please keep your scheduled appointment at [facility] in [facility]. You have been started on three new medications. One antibiotic, Bactrim, daily for prophylaxis against infection while you are immunocompromised due to your HIV. For your  sacral wounds, please take valacyclovir three times daily for the next 10 days. The third medicine is an anti seizure medication, Keppra, that you should take twice a day. New prescriptions have been sent in to your pharmacy. If you should develop any further seizure activity, please return to the ER immediately for evaluation. Because of your recent seizure activity, it is important that you do not drive for the next 6 months. Please avoid swimming. Take showers instead of baths, and avoid ladders or heights as a precaution. If you are seizure free after 6 months, you may speak with neurology about driving again. If you have any questions or concerns, call our clinic at [phone] or after hours call [phone] and ask for the internal medicine resident on call. It was a pleasure taking care of you. Thank you!   Increase activity slowly    Complete by:  As directed       Signed: Reymundo Pollarolyn Trina Asch, MD 09/04/2016, 4:16 PM   Pager: [phone]

## 2016-09-01 NOTE — Consult Note (Addendum)
WOC Nurse wound consult note Reason for Consult: Consult requested for buttocks.  Wound type: Pt has multiple full thickness peri-rectal lesions located within the gluteal fold and near the rectum.  He has HIV, according to the EMR, the appearance and location of these wounds is consistent with a condition which is commonly a result of this diagnosis. Pt states he has discussed the wounds with ID team and had a biopsy performed previously. Pressure Ulcer POA: These are NOT pressure injuries Measurement: Upper gluteal fold 2X2X.1cm, inner areas are 1X1X.1cm, .8X.8X.1cm, .5X.5X.1cm, .4X.4X.1cm Wound bed: Dark reddish-purple and moist Drainage (amount, consistency, odor) Mod amt tan drainage, areas are painful. Dressing procedure/placement/frequency: Encouraged pt to continue follow-up after discharge with ID clinic.  Topical treatment will not be effective in promoting healing, goals are to absorb drainage and decrease discomfort. Foam dressing to keep sites from rubbing together and discussed using barrier cream when at home to decrease discomfort to affected areas, and showering without dressings in place. Pt verbalized understanding. Please re-consult if further assistance is needed.  Cammie Mcgeeawn Inella Kuwahara MSN, RN, CWOCN, [place]-AP, CNS [phone]

## 2016-09-01 NOTE — Progress Notes (Addendum)
LP results showed no malignant cells or abnormality on cytology.  PEr neurology patient may be discharged but should remain on Keppra until followed up with neurology.    Per [facility]  [place] statutes, patients with seizures are not allowed to drive until  they have been seizure-free for six months. Use caution when using heavy equipment or power tools. Avoid working on ladders or at heights. Take showers instead of baths. Ensure the water temperature is not too high on the home water heater. Do not go swimming alone. When caring for infants or small children, sit down when holding, feeding, or changing them to minimize risk of injury to the child in the event you have a seizure.   Out patient referral has been placed in Discharge but he will need a script for 60 tabs with 3-4 refills 500 mg Keppra on discharge.   Discussed with Dr Antony ContrasGuilloud of teaching service and they will be talking to ID about his positive CMV. Would agree with having infectious disease evaluate the positive CMV findings prior to discharging patient from hospital.  Felicie Mornavid Javoni Lucken PA-C [facility] [phone]  M-F  (8:30 am- 4 PM)  09/01/2016, 9:24 AM

## 2016-09-01 NOTE — Progress Notes (Signed)
   Subjective: Patient feels well today and is eager for discharge. Tolerated large volume LP well yesterday with no events overnight.   Objective:  Vital signs in last 24 hours: Vitals:   08/31/16 0457 08/31/16 1505 08/31/16 2258 09/01/16 0550  BP: 123/79 (!) 117/56 (!) 153/91 (!) 152/98  Pulse: 89 (!) 102 87 (!) 105  Resp: 18 18 20 18   Temp: 98.2 F (36.8 C)  98.2 F (36.8 C) 98.7 F (37.1 C)  TempSrc: Oral  Oral Oral  SpO2: 100% 100% 100% 100%  Weight:      Height:       Physical Exam Constitutional: NAD, appears comfortable Cardiovascular:RRR, no murmurs, rubs, or gallops.  Pulmonary/Chest:CTAB, no wheezes, rales, or rhonchi.  Abdominal:Soft, non tender, non distended. +BS.  GU: Multiple open ulcers in his gluteal cleft and perianal region, non purulent. Mild surrounding erythema. Serosanguinous drainage on bed sheets.  Extremities: Warm and well perfused. Distal pulses intact. No edema.  Neurological:A&Ox3, CN II - XII grossly intact. No deficits.  Skin: No rashes or erythema  Psychiatric:Normal mood and affect     Assessment/Plan:  Seizure: In the setting of HIV/AIDs and an undetectable CD4 count on admission. Infectious work-up thus far has been negative and CSF appears aseptic. Today, CSF resulted positive for CMV PCR. Discussed with Dr. Ninetta LightsHatcher who feels this is likely incidental and unlikely to be the cause of his seizure. MRI findings and clinical picture are inconsistent with CMV encephalitis. Patient is back to his baseline without neuro or cognitive deficits and we feel that an aseptic meningitis or encephalitis is unlikely. MRI findings are atypical and nonspecific. Patient underwent repeat LP yesterday for high volume cytology which resulted as negative today. Neurosurgery does not recommend biopsy at this point. Plan for f/u MRI 1-2 weeks. -- Neurology and ID following, appreciate recommendations  -- Repeat MRI 1-2 weeks -- Continue Keppra 500 mg BID, f/u  with neurology 2 weeks -- F/u CSF cultures (No growth 3 days) and PCR (HSV -neg; CMV -positive; JC virus & EBV pending) -- CSF cryptococcal antigen negative -- Blood cultures no growth 3 days x 2 -- Continue Bactrim daily for PCP prophylaxis -- Seizure precautions  -- IV ativan prn for seizures  Perianal Ulcers: [facility] evaluated [facility], has not follow up. Had anoscopy earlier this year. Scheduled for follow up on 12/14.  -- [facility] -- Preparation H cream TID prn -- Wound care consult  FEN: No fluids, replete lytes prn, regular diet VTE ppx: Lovenox  Code Status: FULL  Dispo: Anticipated discharge today pending wound care consult.   Reymundo Pollarolyn Fynlee Rowlands, MD 09/01/2016, 8:59 AM Pager: [phone]

## 2016-09-01 NOTE — Discharge Instructions (Signed)
Seizure, Adult °A seizure is a sudden burst of abnormal electrical activity in the brain. The abnormal activity temporarily interrupts normal brain function, causing a person to experience any of the following: °· Involuntary movements. °· Changes in awareness or consciousness. °· Uncontrollable shaking (convulsions). ° °Seizures usually last from 30 seconds to 2 minutes. They usually do not cause permanent brain damage unless they are prolonged. °What can cause a seizure to happen? °Seizures can happen for many reasons including: °· A fever. °· Low blood sugar. °· A medicine. °· An illnesses. °· A brain injury. ° °Some people who have a seizure never have another one. People who have repeated seizures have a condition called epilepsy. °What are the symptoms of a seizure? °Symptoms of a seizure vary greatly from person to person. They include: °· Convulsions. °· Stiffening of the body. °· Involuntary movements of the arms or legs. °· Loss of consciousness. °· Breathing problems. °· Falling suddenly. °· Confusion. °· Head nodding. °· Eye blinking or fluttering. °· Lip smacking. °· Drooling. °· Rapid eye movements. °· Grunting. °· Loss of bladder control and bowel control. °· Staring. °· Unresponsiveness. ° °Some people have symptoms right before a seizure happens (aura) and right after a seizure happens. Symptoms of an aura include: °· Fear or anxiety. °· Nausea. °· Feeling like the room is spinning (vertigo). °· A feeling of having seen or heard something before (deja vu). °· Odd tastes or smells. °· Changes in vision, such as seeing flashing lights or spots. ° °Symptoms that may follow a seizure include: °· Confusion. °· Sleepiness. °· Headache. °· Weakness of one side of the body. ° °Follow these instructions at home: °Medicines ° °· Take over-the-counter and prescription medicines only as told by your health care provider. °· Avoid any substances that may prevent your medicine from working properly, such as  alcohol. °Activity °· Do not drive, swim, or do any other activities that would be dangerous if you had another seizure. Wait until your health care provider approves. °· If you live in the [place] check with your local DMV (department of motor vehicles) to find out about the local driving laws. Each state has specific rules about when you can legally return to driving. °· Get enough rest. Lack of sleep can make seizures more likely to occur. °Educating others °Teach friends and family what to do if you have a seizure. They should: °· Lay you on the ground to prevent a fall. °· Cushion your head and body. °· Loosen any tight clothing around your neck. °· Turn you on your side. If vomiting occurs, this helps keep your airway clear. °· Stay with you until you recover. °· Not hold you down. Holding you down will not stop the seizure. °· Not put anything in your mouth. °· Know whether or not you need emergency care. ° °General instructions °· Contact your health care provider each time you have a seizure. °· Avoid anything that has ever triggered a seizure for you. °· Keep a seizure diary. Record what you remember about each seizure, especially anything that might have triggered the seizure. °· Keep all follow-up visits as told by your health care provider. This is important. °Contact a health care provider if: °· You have another seizure. °· You have seizures more often. °· Your seizure symptoms change. °· You continue to have seizures with treatment. °· You have symptoms of an infection or illness. They might increase your risk of having a seizure. °Get help   right away if: °· You have a seizure: °? That lasts longer than 5 minutes. °? That is different than previous seizures. °? That leaves you unable to speak or use a part of your body. °? That makes it harder to breathe. °? After a head injury. °· You have: °? Multiple seizures in a row. °? Confusion or a severe headache right after a seizure. °· You are having  seizures more often. °· You do not wake up immediately after a seizure. °· You injure yourself during a seizure. °These symptoms may represent a serious problem that is an emergency. Do not wait to see if the symptoms will go away. Get medical help right away. Call your local emergency services (911 in the [place] Do not drive yourself to the hospital. °This information is not intended to replace advice given to you by your health care provider. Make sure you discuss any questions you have with your health care provider. °Document Released: 09/09/2000 Document Revised: 05/08/2016 Document Reviewed: 04/15/2016 °Elsevier Interactive Patient Education © 2017 [employer] Inc. ° °

## 2016-09-01 NOTE — Progress Notes (Signed)
Pharmacy Antibiotic Note  [name] [name] is a 20 y.o. male admitted on 08/29/2016 with seizure.  Pharmacy consulted for sulfamethoxazole-trimethoprim dosing for PCP prophylaxis. Hx HIV, not currently on treatment.  ID following.  Pharmacy also to review anti-epileptic meds for drug interactions. Currently on Keppra and prn Ativan.- no current interactions  Plan: Continue Septra DS 1 tablet daily. Valtrex 1 gram TID per ID MD Pharmacy sign off. Please re-consult as needed  Height: '6\' 7"'  (200.7 cm) Weight: 162 lb 14.4 oz (73.9 kg) IBW/kg (Calculated) : 93.7  Temp (24hrs), Avg:98.5 F (36.9 C), Min:98.2 F (36.8 C), Max:98.7 F (37.1 C)   Recent Labs Lab 08/29/16 1210 08/30/16 0410 08/31/16 0552 09/01/16 0353  WBC 4.4 2.9* 3.3* 4.2  CREATININE 0.75 0.73 0.70 0.73    Estimated Creatinine Clearance: 151.4 mL/min (by C-G formula based on SCr of 0.73 mg/dL).    No Known Allergies  Antimicrobials this admission: Septra DS 12/4>> Valtrex 12/7>>  Dose adjustments this admission:  n/a  Microbiology results: 12/4 CSF - ng x 2 days so far  12/4 blood x 2 - NG <24 hrs so far  12/5 EBV -  12/5 CMV -positive but PCR neg  12/5 HLA B -ip  12/5 HIV RNA -  12/4 Hep C -neg  12/4 RPR -neg  12/4 CD4 10, CD4 helper 1  12/4 HSV 1 and 2 -neg  12/4 Crypto Ab -neg  12/6 JC virus -  12/6 GC/Chlamydia -   Thank you for allowing [place] to participate in this patients care. Jens Som, PharmD Pager: [phone] 09/01/2016 12:32 PM

## 2016-09-01 NOTE — Progress Notes (Addendum)
INFECTIOUS DISEASE PROGRESS NOTE  ID: [name] [name] is a 20 y.o. male with  Principal Problem:   Seizure (HCC) Active Problems:   AIDS (HCC)  Subjective: Con complaints.   Abtx:  Anti-infectives    Start     Dose/Rate Route Frequency Ordered Stop   08/29/16 2100  sulfamethoxazole-trimethoprim (BACTRIM DS,SEPTRA DS) 800-160 MG per tablet 1 tablet     1 tablet Oral Daily 08/29/16 1941        Medications:  Scheduled: . enoxaparin (LOVENOX) injection  40 mg Subcutaneous Q24H  . levETIRAcetam  500 mg Oral BID  . sodium chloride flush  3 mL Intravenous Q12H  . sulfamethoxazole-trimethoprim  1 tablet Oral Daily    Objective: Vital signs in last 24 hours: Temp:  [98.2 F (36.8 C)-98.7 F (37.1 C)] 98.7 F (37.1 C) (12/07 0550) Pulse Rate:  [87-105] 105 (12/07 0550) Resp:  [18-20] 18 (12/07 0550) BP: (117-153)/(56-98) 152/98 (12/07 0550) SpO2:  [100 %] 100 % (12/07 0550)   General appearance: alert, cooperative and no distress Throat: normal findings: oropharynx pink & moist without lesions or evidence of thrush Resp: clear to auscultation bilaterally Cardio: regular rate and rhythm GI: normal findings: bowel sounds normal and soft, non-tender Skin: ulcerative lesion on his coccyx  Lab Results  Recent Labs  08/31/16 0552 09/01/16 0353  WBC 3.3* 4.2  HGB 9.3* 10.1*  HCT 28.9* 30.3*  NA 138 138  K 3.8 3.9  CL 102 103  CO2 27 28  BUN 8 9  CREATININE 0.70 0.73   Liver Panel  Recent Labs  08/31/16 0552 09/01/16 0353  PROT 8.2* 9.3*  ALBUMIN 3.3* 3.6  AST 41 48*  ALT 45 52  ALKPHOS 66 74  BILITOT 0.3 0.5   Sedimentation Rate No results for input(s): ESRSEDRATE in the last 72 hours. C-Reactive Protein No results for input(s): CRP in the last 72 hours.  Microbiology: Recent Results (from the past 240 hour(s))  CSF culture     Status: None   Collection Time: 08/29/16  2:38 PM  Result Value Ref Range Status   Specimen Description CSF  Final   Special Requests Immunocompromised  Final   Gram Stain   Final    WBC PRESENT, PREDOMINANTLY MONONUCLEAR NO ORGANISMS SEEN CYTOSPIN    Culture NO GROWTH 3 DAYS  Final   Report Status 09/01/2016 FINAL  Final  Culture, blood (routine x 2)     Status: None (Preliminary result)   Collection Time: 08/29/16  8:00 PM  Result Value Ref Range Status   Specimen Description BLOOD RIGHT ANTECUBITAL  Final   Special Requests BOTTLES DRAWN AEROBIC AND ANAEROBIC 5CC  Final   Culture NO GROWTH 3 DAYS  Final   Report Status PENDING  Incomplete  Culture, blood (routine x 2)     Status: None (Preliminary result)   Collection Time: 08/29/16  8:09 PM  Result Value Ref Range Status   Specimen Description BLOOD RIGHT ANTECUBITAL  Final   Special Requests BOTTLES DRAWN AEROBIC AND ANAEROBIC 5CC  Final   Culture NO GROWTH 3 DAYS  Final   Report Status PENDING  Incomplete    Studies/Results: No results found.   Assessment/Plan: Seizure             EEG normal             MRI shows grey matter changes.             CMV is positive but PCR is negative.  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 D/i Neuro  Other PCR pending.   No further w/u at this time  AIDS            awaiting ADAP  Continue bactrim  Appreciate IMTS excellent care of pt  F/u with me in 2 weeks.   Sacral wound.   give him valtrex for 10 days.   Appreciate wound care eval.   This was not prev d/i with me by opt           Johny SaxJeffrey Tharon Bomar Infectious Diseases (pager) [phone] [email or web address] 09/01/2016, 12:05 PM  LOS: 3 days

## 2016-09-01 NOTE — Progress Notes (Signed)
Subjective: Patient reports new arm pain while rasing R arm. The patient states his headache and neck pain is improving.   Objective: Vital signs in last 24 hours: Vitals:   08/31/16 0457 08/31/16 1505 08/31/16 2258 09/01/16 0550  BP: 123/79 (!) 117/56 (!) 153/91 (!) 152/98  Pulse: 89 (!) 102 87 (!) 105  Resp: 18 18 20 18  Temp: 98.2 F (36.8 C)  98.2 F (36.8 C) 98.7 F (37.1 C)  TempSrc: Oral  Oral Oral  SpO2: 100% 100% 100% 100%  Weight:      Height:       Weight change:   Intake/Output Summary (Last 24 hours) at 09/01/16 1049 Last data filed at 09/01/16 0552  Gross per 24 hour  Intake                0 ml  Output              300 ml  Net             -300 ml   Gen: Well appearing, thin adult male fully conversant with team. Oriented x3. HEENT: Normocephalic atraumatic. MMM. Anicteric. PERRL Cv: RRR Nl s1 s2 without mrumur Resp: Normal work of breathing. CTAB Ab: Non distended Normal bowel sounds. Non tender.  Gu: Has ~5 significant non bleeding ulcerations about 3 cm throughout perianal region extending up to his sacrum. See image in resident note Neuro: oriented x 3. Facial movements intact and symmetric. EOM intact Ext: No LE Edema. Lab Results: CBC notable for 10.1 hb. CMP unremarkable. CSF CMV dna PCR returned positive. Pending JC and EBV CSF PCR. Cytology from LP yesterday was negative for malignant cells.   Micro Results: Recent Results (from the past 240 hour(s))  CSF culture     Status: None   Collection Time: 08/29/16  2:38 PM  Result Value Ref Range Status   Specimen Description CSF  Final   Special Requests Immunocompromised  Final   Gram Stain   Final    WBC PRESENT, PREDOMINANTLY MONONUCLEAR NO ORGANISMS SEEN CYTOSPIN    Culture NO GROWTH 3 DAYS  Final   Report Status 09/01/2016 FINAL  Final  Culture, blood (routine x 2)     Status: None (Preliminary result)   Collection Time: 08/29/16  8:00 PM  Result Value Ref Range Status   Specimen  Description BLOOD RIGHT ANTECUBITAL  Final   Special Requests BOTTLES DRAWN AEROBIC AND ANAEROBIC 5CC  Final   Culture NO GROWTH 3 DAYS  Final   Report Status PENDING  Incomplete  Culture, blood (routine x 2)     Status: None (Preliminary result)   Collection Time: 08/29/16  8:09 PM  Result Value Ref Range Status   Specimen Description BLOOD RIGHT ANTECUBITAL  Final   Special Requests BOTTLES DRAWN AEROBIC AND ANAEROBIC 5CC  Final   Culture NO GROWTH 3 DAYS  Final   Report Status PENDING  Incomplete   Studies/Results: No results found. Medications: I have reviewed the patient's current medications. Scheduled Meds: . enoxaparin (LOVENOX) injection  40 mg Subcutaneous Q24H  . levETIRAcetam  500 mg Oral BID  . sodium chloride flush  3 mL Intravenous Q12H  . sulfamethoxazole-trimethoprim  1 tablet Oral Daily   Continuous Infusions: PRN Meds:.acetaminophen, hydrocortisone cream, LORazepam Assessment/Plan: Principal Problem:   Seizure (HCC) Active Problems:   AIDS (HCC)  [name] [name], [name] is a 20 yo M with a PMH of AIDS currently on no medication with an undetectable cd4 count presents   to the hospital via EMS after experiencing a seizure.   Seizure - The patient's uncontrolled HIV, AIDS defining illness and cd4 undetectable wasworrisome for a infection. The patient exhibits no signs or symptoms of infection. No neurologic signs to suggest a intracranial process. CT confirms no acute intracranial mass effect. Per neuro radiology, MRI is consistent with a metabolic, drug related process, or CNS lymphomaNo neck stiffness or photophobiato suggest meningitis. LP is aseptic. Cytology was negative for malignancy suggesting against CNS lymphoma. UDS negative. No new meds. No Family history of seizures.    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 Suggest repeating MRI in couple of weeks.   AIDS - Most recent Cd4 10.  - Following with ID - HLA B57, HIV RNA  - MoCA of 25.  - SMX - TMP prophylaxis given below 200 cd4.  - No azithromycin coverage for patient since starting ART with ID soon  Anal fissure - Has significant non bleeding ulcerations throughout perianal region extending up to his sacrum. Was seen at [facility] with anoscopy. Has f/u appoint 12/14.   - f/u appoint  - wound care consult  FENGI - Regular diet - DVT Pro: Lovenox 40 mg qd  Dispo  - f/u appoint with [facility] 12/14 for anal ulcerations - f/u appointment Dec 13th 215pm - Today after wound care  This is a Careers information officer Note.  The care of the patient was discussed with Dr. Philipp Ovens and the assessment and plan formulated with their assistance.  Please see their attached note for official documentation of the daily encounter.   LOS: 3 days   Benn Moulder, Medical Student 09/01/2016, 10:49 AM

## 2016-09-01 NOTE — Progress Notes (Addendum)
Aldean BakerAaron Racca to be D/C'd home per MD order.  Discussed with the patient and all questions fully answered.  VSS, Skin clean, dry and wound dressing to sacrum was done by wound nurse. IV catheter discontinued intact. Site without signs and symptoms of complications. Dressing and pressure applied.  An After Visit Summary was printed and given to the patient. Patient received prescription.  D/c education completed with patient including follow up instructions, medication list, d/c activities limitations if indicated, with other d/c instructions as indicated by MD - patient able to verbalize understanding, all questions fully answered.   Patient instructed to return to ED, call 911, or call MD for any changes in condition.   Patient ambulated with parent, and D/C home via private auto.  Melvenia NeedlesIreti O Mynor Witkop 09/01/2016 2:18 PM

## 2016-09-02 ENCOUNTER — Other Ambulatory Visit: Payer: Self-pay | Admitting: *Deleted

## 2016-09-02 ENCOUNTER — Telehealth: Payer: Self-pay | Admitting: Pharmacist Clinician (PhC)/ Clinical Pharmacy Specialist

## 2016-09-02 ENCOUNTER — Other Ambulatory Visit: Payer: Self-pay | Admitting: Pharmacist Clinician (PhC)/ Clinical Pharmacy Specialist

## 2016-09-02 ENCOUNTER — Telehealth: Payer: Self-pay | Admitting: *Deleted

## 2016-09-02 LAB — HLA B*5701: HLA B 5701: NEGATIVE

## 2016-09-02 MED ORDER — LEVETIRACETAM 500 MG PO TABS: 500.0000 mg | ORAL_TABLET | Freq: Two times a day (BID) | ORAL | 4 refills | Status: DC

## 2016-09-02 MED ORDER — EMTRICITABINE-TENOFOVIR AF 200-25 MG PO TABS: 1.0000 | ORAL_TABLET | Freq: Every day | ORAL | 5 refills | Status: DC

## 2016-09-02 MED ORDER — SULFAMETHOXAZOLE-TRIMETHOPRIM 800-160 MG PO TABS: 1.0000 | ORAL_TABLET | Freq: Every day | ORAL | 2 refills | Status: DC

## 2016-09-02 MED ORDER — DARUNAVIR-COBICISTAT 800-150 MG PO TABS: 1.0000 | ORAL_TABLET | Freq: Every day | ORAL | 5 refills | Status: DC

## 2016-09-02 MED FILL — PREZCOBIX 800 MG-150 MG TAB: 800-150 | 30 days supply | Qty: 30 | Fill #0

## 2016-09-02 MED FILL — DESCOVY 200-25 MG TABS: 200-25 | 30 days supply | Qty: 30 | Fill #0

## 2016-09-02 NOTE — Telephone Encounter (Signed)
We used the Prezcobix voucher card and advancing access to get him Prezcobix and Descovy for 30 days while his emergency ADAP is pending. Ambre will pick up both today at [facility].

## 2016-09-03 LAB — CULTURE, BLOOD (ROUTINE X 2)
Culture: NO GROWTH
Culture: NO GROWTH

## 2016-09-04 LAB — JC VIRUS, PCR CSF: JC Virus PCR, CSF: NEGATIVE

## 2016-09-05 ENCOUNTER — Telehealth: Payer: Self-pay | Admitting: *Deleted

## 2016-09-05 ENCOUNTER — Ambulatory Visit: Payer: Self-pay | Admitting: *Deleted

## 2016-09-05 DIAGNOSIS — B20 Human immunodeficiency virus [HIV] disease: Secondary | ICD-10-CM

## 2016-09-05 NOTE — Telephone Encounter (Signed)
RN followed up with Ms. Betty([facility]) and she is currently working on getting the patient's medication authorized. Once she has completed this process she will be getting in contact with me so I can get the medications to the patient  RN attempted to call the patient again without any success. Number goes straight to message that states "this number has a voicemail that has not been set up..please try your call again later"  RN contacted [facility]ee/[facility] Bridge Counselor and gave him a update on the medications

## 2016-09-05 NOTE — Telephone Encounter (Signed)
RN arrived at [facility] and one of the 2 medications are not ready for pick up. They is a problem with medication payment/authorization.  Due to inclement weather [facility] clinic has closed and I am unable to f/u to get the authorization approved    RN contacted Mitch McGee/Bridge Counselor and made him aware of the delay with picking up medications.  RN contacted the patient but was unable to get through and the mailbox has not been set up for voicemails.   RN will follow up on the medications 1st thing Monday Morning

## 2016-09-06 ENCOUNTER — Telehealth: Payer: Self-pay | Admitting: General Practice

## 2016-09-06 LAB — REFLEX TO GENOSURE(R) MG: HIV GenoSure(R) MG PDF: 0

## 2016-09-06 LAB — HIV-1 RNA ULTRAQUANT REFLEX TO GENTYP+
HIV-1 RNA BY PCR: 774000 copies/mL
HIV-1 RNA Quant, Log: 5.889 log10copy/mL

## 2016-09-06 NOTE — Telephone Encounter (Signed)
APT. REMINDER CALL, LMTCB °

## 2016-09-07 ENCOUNTER — Ambulatory Visit (INDEPENDENT_AMBULATORY_CARE_PROVIDER_SITE_OTHER): Payer: Self-pay | Admitting: Internal Medicine

## 2016-09-07 VITALS — BP 132/78 | HR 80 | Temp 97.5°F | Wt 165.0 lb

## 2016-09-07 DIAGNOSIS — B20 Human immunodeficiency virus [HIV] disease: Secondary | ICD-10-CM

## 2016-09-07 DIAGNOSIS — F3289 Other specified depressive episodes: Secondary | ICD-10-CM

## 2016-09-07 DIAGNOSIS — R569 Unspecified convulsions: Secondary | ICD-10-CM

## 2016-09-07 NOTE — Patient Instructions (Addendum)
It was a pleasure meeting you today! I look forward to you coming to our Internal Medicine Clinic. I'm glad you are feeling better since your hospital discharge.  1. Today we talked about HIV - Please continue taking your medications as prescribed and follow up with the infectious disease clinic. Your appointment is with Dr. Ninetta LightsHatcher 09/14/16 @ 9:30 am. Address below. [address] [place] [id number] 2. Neurology was supposed to call you with an appointment to follow-up. Since they have not yet, please go ahead and give them a call to set one up.  [phone] [address] [place] [phone] 3. Today we also talked about your depression. Please give the clinic a call or go to the ED if you have any thoughts of hurting yourself or someone else. If you decide to start on medication for depression, please give the clinic a call. 4. Happy holidays and Happy New Year! 5. Return to clinic in 3 months or sooner if needed.

## 2016-09-07 NOTE — Progress Notes (Signed)
   CC: hospital follow-up of seizures.   HPI:  [name] [name] is a 20 y.o. M with Mhx significant for AIDS (CD4 count 10 12/17) and hx of seizures who was recently admitted 12/4-12/7 for evaluation of seizures. Seizure was described as pain located behind his eyes while watching TV which then progressed into the urge to fall asleep. He then awoke in the ambulance but does not remember the events following this well. Dad described seizure activity as a full-body contraction with shaking, foaming of his mouth and eyes rolling back. Hospital w/u negative for infectious source including 2 negative LPs and a negative MRI. Neurology suggested pt be d/c home on Keppra 500mg  BID and instructions to f/u with neuro and ID. Patient presently denies any complaints other than feeling depressed recently and he denies any more seizure activity since d/c. In regards to his depression, states he recently moved to [place] from [place] and doesn't have many friends or family around that provide good support. Reports he lives with his dad who works nights who insists he gets a job however will not take him to interviews. Denies any suicidal or homicidal ideations.   Past Medical History:  Diagnosis Date  . Hepatitis B immune   . HIV disease (HCC)   . Immune deficiency disorder (HCC)   . 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 Syphilis     Review of Systems:  Review of Systems  Constitutional: Negative for chills, fever and weight loss.  Respiratory: Negative for cough, sputum production, shortness of breath and wheezing.   Cardiovascular: Negative for chest pain and leg swelling.  Gastrointestinal: Negative for abdominal pain, diarrhea, nausea and vomiting.  Genitourinary: Negative for dysuria and frequency.  Neurological: Negative for dizziness, sensory change, seizures, loss of consciousness and headaches.  Psychiatric/Behavioral: Positive for depression. Negative for suicidal  ideas. The patient is nervous/anxious.    Physical Exam: Physical Exam  Constitutional: He is oriented to person, place, and time. He appears well-developed. No distress.  Thin, chronically-ill appearing young african [place] male. Very pleasant.   HENT:  Head: Normocephalic and atraumatic.  Mouth/Throat: No oropharyngeal exudate.  Cardiovascular: Normal rate, regular rhythm and normal heart sounds.   Pulmonary/Chest: Effort normal and breath sounds normal. No respiratory distress. He has no wheezes.  Abdominal: Soft. Bowel sounds are normal. He exhibits no distension. There is no tenderness.  Neurological: He is alert and oriented to person, place, and time. He exhibits normal muscle tone.  Skin: Skin is warm and dry. No rash noted. He is not diaphoretic.  Psychiatric: He has a normal mood and affect. His behavior is normal. Judgment and thought content normal.   Vitals:   09/07/16 1441  BP: 132/78  Pulse: 80  Temp: 97.5 F (36.4 C)  TempSrc: Oral  SpO2: 100%  Weight: 165 lb (74.8 kg)    Assessment & Plan:   See Encounters Tab for problem based charting.  Patient seen with Dr. Oswaldo DoneVincent

## 2016-09-08 DIAGNOSIS — F32A Depression, unspecified: Secondary | ICD-10-CM | POA: Insufficient documentation

## 2016-09-08 DIAGNOSIS — F329 Major depressive disorder, single episode, unspecified: Secondary | ICD-10-CM | POA: Insufficient documentation

## 2016-09-08 NOTE — Assessment & Plan Note (Signed)
Hx consistent w/ seizure earlier this month but denies any other similar episodes since that time. Was hospitalized and infectious workup including head CT and MRI, LP x2, blood cultures, urine cultures, etc were negative. Was seen by neurology in the hospital who recommended starting Keppra 500 mg BID with follow up. Pt did not have an appointment with neurology during our visit so one was made for the patient.  -Continue Keppra 500 mg BID -Appt made for neuro follow-up for 2 weeks.

## 2016-09-08 NOTE — Progress Notes (Signed)
After reviewing the patient's chart RN meet with the patient and offer my case management services. RN explained to the patient that I am a Designer, jewelleryegistered Nurse that works out [place] [employer] for Dr. Ninetta LightsHatcher and the entire [facility]. My passion is to help my community with medication adherence, support with life struggles and linkage to [employer]. Dr. Ninetta LightsHatcher and I would like to know if you would be interest in my services? [name] [name] stated he would like to have my services and states he is ready to adhere to his medications. RN gave [name] CustardAaron the advice that he can manage his HIV or allow it to manage him. RN informed [name] [name] that if he allows HIV to go unmanaged it will be a terrible scenario of feeling horrible and going in and out of the hospital with various infections and viruses. [name] [name] stated he understood and wants to be back in care. We discussed his childhood and feeling after finding out her was positive at the age of 20. Plan at this time is to meet with [name] CustardAaron tomorrow morning for consents to be signed for service.

## 2016-09-08 NOTE — Progress Notes (Signed)
Rn arrived this morning and reviewed the patient's chart prior to seeing him. RN meet with Clifton CustardAaron who still confirmed that he would like to have my services after his discharge from the hospital. RN reviewed Agency Services, [place] Notice of Privacy Policy, Home Safety Management Information Booklet. Home Fire Safety Assessment, Fall Risk Assessment and Suicide Risk Assessment was performed. RN also discussed information on a Living Will, Advanced Directives, and Health Care Power of [place] Grant[place]orney. RN and Client/Designated Party educated/reviewed/signed Client Agreement and Consent for Service form along with Patient Rights and Responsibilities statement. RN developed patient specific and centered care plan. RN provided contact information and reviewed how to receive emergency help after hours for schedule changes, reporting of safety issues, falls, concerns or any needs/questions. Standard Precaution and Infection control along with interventions to correct or prevent high risk behaviors instructed to the patient. Client/Caregiver reports understanding and agreement with the above.

## 2016-09-08 NOTE — Progress Notes (Signed)
Internal Medicine Clinic Attending  I saw and evaluated the patient.  I personally confirmed the key portions of the history and exam documented by Dr. Molt and I reviewed pertinent patient test results.  The assessment, diagnosis, and plan were formulated together and I agree with the documentation in the resident's note. 

## 2016-09-08 NOTE — Assessment & Plan Note (Signed)
Pt reports feeling down lately secondary to his recent move to [place] and subsequently not having good friend-support or family-support here. He reports he lives with his dad however finds him unsupportive and unwilling to help him get to job interviews or the store often. Pt reports he feels safe at home, has food and clothing. Denied any suicidal or homicidal ideations. -Pt declined medication -Will continue to monitor -PHQ9 score 10

## 2016-09-08 NOTE — Assessment & Plan Note (Signed)
Pt reports compliance with Prezcobix and Descovy. Also reports compliance with prophylactic medications of Bactrim, Diflucan and Fluconazole. Denies any symptoms consistent with IRIS or frank opportunistic infection. Still has not followed up with infectious disease however was given an appointment during our visit. Encouraged follow-up and continued compliance with antiretrovirals.  -Continue current therapy with Prezcobix, Descovy, Diflucan, Bactrim and Valtrex -Pt scheduled to have ID follow-up 12/20 and was given contact information

## 2016-09-14 ENCOUNTER — Encounter: Payer: Self-pay | Admitting: Infectious Diseases

## 2016-09-14 ENCOUNTER — Ambulatory Visit (INDEPENDENT_AMBULATORY_CARE_PROVIDER_SITE_OTHER): Payer: Self-pay | Admitting: Infectious Diseases

## 2016-09-14 VITALS — BP 143/87 | HR 103 | Temp 98.8°F | Ht 79.0 in | Wt 174.0 lb

## 2016-09-14 DIAGNOSIS — Z113 Encounter for screening for infections with a predominantly sexual mode of transmission: Secondary | ICD-10-CM

## 2016-09-14 DIAGNOSIS — R569 Unspecified convulsions: Secondary | ICD-10-CM

## 2016-09-14 DIAGNOSIS — B0223 Postherpetic polyneuropathy: Secondary | ICD-10-CM

## 2016-09-14 DIAGNOSIS — Z79899 Other long term (current) drug therapy: Secondary | ICD-10-CM

## 2016-09-14 DIAGNOSIS — B029 Zoster without complications: Secondary | ICD-10-CM | POA: Insufficient documentation

## 2016-09-14 DIAGNOSIS — Z23 Encounter for immunization: Secondary | ICD-10-CM

## 2016-09-14 DIAGNOSIS — K603 Anal fistula: Secondary | ICD-10-CM

## 2016-09-14 DIAGNOSIS — B20 Human immunodeficiency virus [HIV] disease: Secondary | ICD-10-CM

## 2016-09-14 MED ORDER — VALACYCLOVIR HCL 1 G PO TABS: 1000.0000 mg | ORAL_TABLET | Freq: Two times a day (BID) | ORAL | 0 refills | Status: DC

## 2016-09-14 NOTE — Assessment & Plan Note (Signed)
Appears to be doing well.  Has f/u appt on 10-12-16 Appreciate Neuro f/u.

## 2016-09-14 NOTE — Assessment & Plan Note (Signed)
Will restart his valtrex, does not appear he got after d/c.

## 2016-09-14 NOTE — Assessment & Plan Note (Signed)
[facility].

## 2016-09-14 NOTE — Progress Notes (Signed)
   Subjective:    Patient ID: [name] [name], male    DOB: 1995/10/22, 22 y.o.   MRN: [id number]  HPI 20 yo M with dx of HIV+ and syphilis in 2013. [facility] and started on complera. He was lost to f/u until 2017. [facility] and had CD4 10 (04-2016)  and HIV RNA of 135,000.  He has been off his ART due to lapse in insurance.   He came to ED 12-4 with pain and pressure behind his eyes while at rest and then 1 minute of generalized seizure. He had a bland LP (Cx negative, JC-, CMV+ but < 200, EBV PCR-, cytology -) and was eval by neuro. The cause of his seizures was unclear, he was started on keppra and was able to be d/c home on 12-7.  In hospital he was started on descovey and tivicay. He has had no issues with his ART. He is also on bactrim.  No further seizures. Needs Neuro appt.  He had wound on his gluteal cleft, this has not improved. Has pain and itching.   CD4 T Cell Abs (/uL)  Date Value  08/29/2016 10 (L)  08/13/2016 <10 (L)   He is looking for a job. Has insomnia. Tried no OTCs for this. Has occas night sweats.  Working on housing with [employer].   Review of Systems  Constitutional: Negative for appetite change and unexpected weight change.  Gastrointestinal: Negative for constipation and diarrhea.  Genitourinary: Negative for difficulty urinating.  Neurological: Negative for seizures.  Psychiatric/Behavioral: Positive for sleep disturbance.  10# wt gain.      Objective:   Physical Exam  Constitutional: He appears well-developed and well-nourished.  HENT:  Mouth/Throat: No oropharyngeal exudate.  Eyes: EOM are normal. Pupils are equal, round, and reactive to light.  Neck: Neck supple.  Cardiovascular: Normal rate, regular rhythm and normal heart sounds.   Pulmonary/Chest: Effort normal and breath sounds normal.  Abdominal: Soft. Bowel sounds are normal. There is no tenderness. There is no rebound.  Musculoskeletal: He exhibits no edema.    Lymphadenopathy:    He has no cervical adenopathy.  Skin:         Assessment & Plan:

## 2016-09-14 NOTE — Assessment & Plan Note (Signed)
He appears to be doing better Given condoms Appreciate Mitch's help with housing, Michelle's help with medications Will write letter of medical necessity.  Mening vax today.  Will see him back in 2 months and check his labs then.

## 2016-09-21 NOTE — Progress Notes (Signed)
Prior to today's drive by visit "RN followed up with Ms. [name] [name]([facility]) and she is currently working on getting the patient's medication authorized. Once she has completed this process she will be getting in contact with me so I can get the medications to the patient. RN attempted to call the patient again without any success. Number goes straight to message that states "this number has a voicemail that has not been set up..please try your call again later" RN contacted [name] Bridge Counselor and gave him a update on the medications  Currently I have picked the medications up from [facility]. I drove to the patient's home and did not receive a answer. While still at the home I contacted the patient's stepmom and explained to the reason for my visit. She stated her husband will be home soon and that the medications can be left in the mailbox. RN explained the value of the medication and that it would be a big lost if someone stole the medication.  Patient's mom completely understood and we discussed the many struggles [name] [name] has had with staying in care. Medications were placed in the mailbox for the patient

## 2016-09-27 ENCOUNTER — Encounter: Payer: Self-pay | Admitting: *Deleted

## 2016-10-05 ENCOUNTER — Inpatient Hospital Stay: Payer: Self-pay | Admitting: Infectious Diseases

## 2016-10-12 ENCOUNTER — Ambulatory Visit: Payer: Self-pay | Admitting: Neurology

## 2016-10-27 ENCOUNTER — Other Ambulatory Visit: Payer: Self-pay | Admitting: *Deleted

## 2016-10-27 ENCOUNTER — Ambulatory Visit (INDEPENDENT_AMBULATORY_CARE_PROVIDER_SITE_OTHER): Payer: Self-pay | Admitting: Neurology

## 2016-10-27 ENCOUNTER — Encounter: Payer: Self-pay | Admitting: Neurology

## 2016-10-27 VITALS — BP 134/72 | HR 63 | Ht 79.0 in | Wt 187.6 lb

## 2016-10-27 DIAGNOSIS — B0223 Postherpetic polyneuropathy: Secondary | ICD-10-CM

## 2016-10-27 DIAGNOSIS — R569 Unspecified convulsions: Secondary | ICD-10-CM

## 2016-10-27 DIAGNOSIS — B20 Human immunodeficiency virus [HIV] disease: Secondary | ICD-10-CM

## 2016-10-27 DIAGNOSIS — A0472 Enterocolitis due to Clostridium difficile, not specified as recurrent: Secondary | ICD-10-CM

## 2016-10-27 HISTORY — DX: Enterocolitis due to Clostridium difficile, not specified as recurrent: A04.72

## 2016-10-27 MED ORDER — LEVETIRACETAM 500 MG PO TABS: 500.0000 mg | ORAL_TABLET | Freq: Two times a day (BID) | ORAL | 11 refills | Status: DC

## 2016-10-27 MED ORDER — VALACYCLOVIR HCL 1 G PO TABS: 1000.0000 mg | ORAL_TABLET | Freq: Two times a day (BID) | ORAL | 0 refills | Status: DC

## 2016-10-27 NOTE — Patient Instructions (Signed)
1. Continue Keppra 500mg  twice a day 2. Let us know once financial aid is settled so we can send order for repeat MRI brain with and without contrast 3. As per [place] driving laws, no driving until 6 months seizure-free 4. Follow-up in 3 months, call for any changes  Seizure Precautions: 1. If medication has been prescribed for you to prevent seizures, take it exactly as directed.  Do not stop taking the medicine without talking to your doctor first, even if you have not had a seizure in a long time.   2. Avoid activities in which a seizure would cause danger to yourself or to others.  Don't operate dangerous machinery, swim alone, or climb in high or dangerous places, such as on ladders, roofs, or girders.  Do not drive unless your doctor says you may.  3. If you have any warning that you may have a seizure, lay down in a safe place where you can't hurt yourself.    4.  No driving for 6 months from last seizure, as [place] [facility] [place].   Please refer to the following link on the [employer]ile[employer] of [employer]'s website for more information: http://www.epilepsyfoundation.org/answerplace/Social/driving/drivingu.cfm   5.  Maintain good sleep hygiene. Avoid alcohol.  6.  Contact your doctor if you have any problems that may be related to the medicine you are taking.  7.  Call 911 and bring the patient back to the ED if:        A.  The seizure lasts longer than 5 minutes.       B.  The patient doesn't awaken shortly after the seizure  C.  The patient has new problems such as difficulty seeing, speaking or moving  D.  The patient was injured during the seizure  E.  The patient has a temperature over 102 F (39C)  F.  The patient vomited and now is having trouble breathing

## 2016-10-27 NOTE — Progress Notes (Signed)
NEUROLOGY CONSULTATION NOTE  [name] [name] MRN: [id number] DOB: Dec 11, 1995  Referring provider: Felicie Mornavid Smith, PA-C Primary care provider: none  Reason for consult:  seizure  Thank you for your kind referral of [name] Bakeraron Watts for consultation of the above symptoms. Although his history is well known to you, please allow me to reiterate it for the purpose of our medical record. Records and images were personally reviewed where available.  HISTORY OF PRESENT ILLNESS: This is a pleasant 21 year old right-handed man with a history of HIV presenting for new onset seizure last 08/29/2016. He recalls waking up feeling extra tired that day, lay on the couch, then woke up in the hospital. His father heard a thud and found him shaking on the floor. No tongue bite or incontinence. [facility], he reported bitemporal pain behind his eyes a few days prior, taking Ibuprofen with some relief. He reported having the eye pain prior to falling asleep. Bloodowork was unremarkable. He had an MRI brain with and without contrast which I personally reviewed, there were T2/FLAIR changes in the perisylvian frontal lobes, insula and temporal lobes as well as increased signal within the caudate nuclei and putamen. In addition there is some abnormal signal in the subcortical white matter and medial temporal lobes and external capsule. These could be seen with seizure and/or infectious, inflammatory, limbic encephalitis. Lumbar puncture was done, CSF WBC 1, RBC 0, protein elevated 141, glucose 48. CSF HSV, crypto, JCV, cultures, cytology were negative. His HIV RNA was 774,000. EBV negative, CMV positive. EEG was normal. He was discharged home on Keppra 500mg  BID and restarted on ART treatment.   He denies any prior history of seizures, and no further seizures since 08/29/16. He denies any olfactory/gustatory hallucinations, deja vu, rising epigastric sensation, focal numbness/tingling/weakness, myoclonic jerks.  Both feet have been constantly tingling since the seizure and that his balance has been off. He dances and feels his coordination is off. He denies any further headaches, dizziness, diplopia, dysarthria/dysphagia, neck/back pain, bowel dysfunction. He has some urinary urgency. Sleep is a little better now, he was getting intermittent sleep prior to the seizure. He denies any alcohol use. He reports a lot of stress around that time. He feels a little irritable on the Keppra but overall tolerating medication.   Epilepsy Risk Factors:  HIV with abnormal MRI brain. Otherwise he had a normal birth and early development.  There is no history of febrile convulsions, CNS infections such as meningitis/encephalitis, significant traumatic brain injury, neurosurgical procedures, or family history of seizures.  PAST MEDICAL HISTORY: Past Medical History:  Diagnosis Date  . Hepatitis B immune   . HIV disease (HCC)   . Immune deficiency disorder (HCC)   . 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 Syphilis     PAST SURGICAL HISTORY: No past surgical history on file.  MEDICATIONS: Current Outpatient Prescriptions on File Prior to Visit  Medication Sig Dispense Refill  . darunavir-cobicistat (PREZCOBIX) 800-150 MG tablet Take 1 tablet by mouth daily with breakfast. Swallow whole. Do NOT crush, break or chew tablets. Take with food. 30 tablet 5  . emtricitabine-tenofovir AF (DESCOVY) 200-25 MG tablet Take 1 tablet by mouth daily. 30 tablet 5  . valACYclovir (VALTREX) 1000 MG tablet Take 1 tablet (1,000 mg total) by mouth 2 (two) times daily. 28 tablet 0  . fluconazole (DIFLUCAN) 100 MG tablet Take 100 mg by mouth daily. Started on 08-23-16 for 14 days    .  hydrocortisone cream 1 % Apply topically 3 (three) times daily as needed for itching. (Patient not taking: Reported on 09/14/2016) 30 g 0  . ibuprofen (ADVIL,MOTRIN) 200 MG tablet Take 400 mg by mouth every 6 (six) hours as  needed for headache or moderate pain.    Marland Kitchen levETIRAcetam (KEPPRA) 500 MG tablet Take 1 tablet (500 mg total) by mouth 2 (two) times daily. (Patient not taking: Reported on 09/14/2016) 60 tablet 4  . Pseudoeph-Doxylamine-DM-APAP (NYQUIL PO) Take 2 capsules by mouth as needed (for cough and congestion).    Marland Kitchen sulfamethoxazole-trimethoprim (BACTRIM DS,SEPTRA DS) 800-160 MG tablet Take 1 tablet by mouth daily. (Patient not taking: Reported on 09/14/2016) 90 tablet 2   No current facility-administered medications on file prior to visit.     ALLERGIES: No Known Allergies  FAMILY HISTORY: No family history on file.  SOCIAL HISTORY: Social History   Social History  . Marital status: Single    Spouse name: N/A  . Number of children: N/A  . Years of education: N/A   Occupational History  . Not on file.   Social History Main Topics  . Smoking status: Current Every Day Smoker    Packs/day: 0.50    Types: Cigarettes  . Smokeless tobacco: Never Used     Comment: trying to cut back   . Alcohol use No  . Drug use: Yes    Types: Marijuana  . Sexual activity: No     Comment: given condom   Other Topics Concern  . Not on file   Social History Narrative  . No narrative on file    REVIEW OF SYSTEMS: Constitutional: No fevers, chills, or sweats, no generalized fatigue, change in appetite Eyes: No visual changes, double vision, eye pain Ear, nose and throat: No hearing loss, ear pain, nasal congestion, sore throat Cardiovascular: No chest pain, palpitations Respiratory:  No shortness of breath at rest or with exertion, wheezes GastrointestinaI: No nausea, vomiting, diarrhea, abdominal pain, fecal incontinence Genitourinary:  No dysuria, urinary retention or frequency Musculoskeletal:  No neck pain, back pain Integumentary: No rash, pruritus, skin lesions Neurological: as above Psychiatric: No depression, insomnia, anxiety Endocrine: No palpitations, fatigue, diaphoresis, mood swings,  change in appetite, change in weight, increased thirst Hematologic/Lymphatic:  No anemia, purpura, petechiae. Allergic/Immunologic: no itchy/runny eyes, nasal congestion, recent allergic reactions, rashes  PHYSICAL EXAM: Vitals:   10/27/16 1048  BP: 134/72  Pulse: 63   General: No acute distress Head:  Normocephalic/atraumatic Eyes: Fundoscopic exam shows bilateral sharp discs, no vessel changes, exudates, or hemorrhages Neck: supple, no paraspinal tenderness, full range of motion Back: No paraspinal tenderness Heart: regular rate and rhythm Lungs: Clear to auscultation bilaterally. Vascular: No carotid bruits. Skin/Extremities: No rash, no edema Neurological Exam: Mental status: alert and oriented to person, place, and time, no dysarthria or aphasia, Fund of knowledge is appropriate.  Recent and remote memory are intact. 3/3 delayed recall. Attention and concentration are normal.    Able to name objects and repeat phrases. Cranial nerves: CN I: not tested CN II: pupils equal, round and reactive to light, visual fields intact, fundi unremarkable. CN III, IV, VI:  full range of motion, no nystagmus, no ptosis CN V: facial sensation intact CN VII: upper and lower face symmetric CN VIII: hearing intact to finger rub CN IX, X: gag intact, uvula midline CN XI: sternocleidomastoid and trapezius muscles intact CN XII: tongue midline Bulk & Tone: normal, no fasciculations. Motor: 5/5 throughout with no pronator drift. Sensation: decreased  pin and vibration to ankles bilaterally. Otherwise intact to light touch, cold, and joint position sense.  No extinction to double simultaneous stimulation.  Romberg test negative Deep Tendon Reflexes: +2 throughout, no ankle clonus Plantar responses: downgoing bilaterally Cerebellar: no incoordination on finger to nose, heel to shin. No dysdiadochokinesia Gait: narrow-based and steady, able to tandem walk adequately. Tremor: none  IMPRESSION: This  is a pleasant 21 year old right-handed man with a history of HIV with new onset seizure on 08/29/16. MRI brain was abnormal with gray and white matter signal changes, which are non-specific and may be seen post-seizure, as well as with infectious/inflammatory conditions. Lumbar puncture was overall unrevealing, with note of elevated CSF protein. He was noted to have serum positive CMV and was restarted on ART medication. He continues to follow-up with Infectious Disease. With abnormal MRI brain, recommend continuation of Keppra 500mg  BID. We discussed side effects. A repeat MRI brain with and without contrast will be done once insurance issues are settled.  [place] driving laws were discussed with the patient, and he knows to stop driving after a seizure, until 6 months seizure-free. He will follow-up in 3 months and knows to call for any changes.  Thank you for allowing me to participate in the care of this patient. Please do not hesitate to call for any questions or concerns.   Patrcia Dolly, M.D.  CC: Dr. Ninetta Lights

## 2016-10-28 ENCOUNTER — Ambulatory Visit: Payer: Self-pay | Admitting: *Deleted

## 2016-10-28 DIAGNOSIS — B20 Human immunodeficiency virus [HIV] disease: Secondary | ICD-10-CM

## 2016-10-28 DIAGNOSIS — Z9114 Patient's other noncompliance with medication regimen: Secondary | ICD-10-CM

## 2016-10-28 DIAGNOSIS — K603 Anal fistula: Secondary | ICD-10-CM

## 2016-11-01 ENCOUNTER — Other Ambulatory Visit (INDEPENDENT_AMBULATORY_CARE_PROVIDER_SITE_OTHER): Payer: Self-pay

## 2016-11-01 ENCOUNTER — Encounter: Payer: Self-pay | Admitting: Infectious Diseases

## 2016-11-01 DIAGNOSIS — B20 Human immunodeficiency virus [HIV] disease: Secondary | ICD-10-CM

## 2016-11-01 DIAGNOSIS — Z79899 Other long term (current) drug therapy: Secondary | ICD-10-CM

## 2016-11-01 DIAGNOSIS — Z113 Encounter for screening for infections with a predominantly sexual mode of transmission: Secondary | ICD-10-CM

## 2016-11-01 LAB — LIPID PANEL
Cholesterol: 149 mg/dL (ref <200)
HDL: 30 mg/dL — ABNORMAL LOW (ref >40)
LDL Cholesterol: 85 mg/dL (ref <100)
Total CHOL/HDL Ratio: 5 Ratio — ABNORMAL HIGH (ref <5.0)
Triglycerides: 168 mg/dL — ABNORMAL HIGH (ref <150)
VLDL: 34 mg/dL — ABNORMAL HIGH (ref <30)

## 2016-11-01 LAB — CBC
HCT: 37.5 % — ABNORMAL LOW (ref 38.5–50.0)
Hemoglobin: 12.5 g/dL — ABNORMAL LOW (ref 13.2–17.1)
MCH: 31.5 pg (ref 27.0–33.0)
MCHC: 33.3 g/dL (ref 32.0–36.0)
MCV: 94.5 fL (ref 80.0–100.0)
MPV: 9.4 fL (ref 7.5–12.5)
Platelets: 270 10*3/uL (ref 140–400)
RBC: 3.97 MIL/uL — ABNORMAL LOW (ref 4.20–5.80)
RDW: 15.4 % — ABNORMAL HIGH (ref 11.0–15.0)
WBC: 5.3 10*3/uL (ref 3.8–10.8)

## 2016-11-01 LAB — COMPREHENSIVE METABOLIC PANEL
ALT: 10 U/L (ref 9–46)
AST: 16 U/L (ref 10–40)
Albumin: 4.3 g/dL (ref 3.6–5.1)
Alkaline Phosphatase: 55 U/L (ref 40–115)
BUN: 14 mg/dL (ref 7–25)
CO2: 28 mmol/L (ref 20–31)
Calcium: 9.9 mg/dL (ref 8.6–10.3)
Chloride: 103 mmol/L (ref 98–110)
Creat: 1 mg/dL (ref 0.60–1.35)
Glucose, Bld: 76 mg/dL (ref 65–99)
Potassium: 4 mmol/L (ref 3.5–5.3)
Sodium: 138 mmol/L (ref 135–146)
Total Bilirubin: 0.3 mg/dL (ref 0.2–1.2)
Total Protein: 9.1 g/dL — ABNORMAL HIGH (ref 6.1–8.1)

## 2016-11-02 ENCOUNTER — Other Ambulatory Visit: Payer: Self-pay | Admitting: Internal Medicine

## 2016-11-02 LAB — RPR

## 2016-11-03 LAB — T-HELPER CELL (CD4) - (RCID CLINIC ONLY)
CD4 % Helper T Cell: 5 % — ABNORMAL LOW (ref 33–55)
CD4 T Cell Abs: 110 /uL — ABNORMAL LOW (ref 400–2700)

## 2016-11-03 NOTE — Telephone Encounter (Signed)
I am not this patient's PCP. He needs to see his primary doctor or follow up with Dr. Ninetta LightsHatcher. Thanks!

## 2016-11-04 ENCOUNTER — Emergency Department (HOSPITAL_COMMUNITY): Payer: Medicaid Other

## 2016-11-04 ENCOUNTER — Encounter (HOSPITAL_COMMUNITY): Payer: Self-pay | Admitting: *Deleted

## 2016-11-04 ENCOUNTER — Inpatient Hospital Stay (HOSPITAL_COMMUNITY)
Admission: EM | Admit: 2016-11-04 | Discharge: 2016-11-06 | DRG: 152 | Disposition: A | Discharge status: Home / Self Care | Payer: Medicaid Other | Attending: Internal Medicine | Admitting: Internal Medicine

## 2016-11-04 DIAGNOSIS — I959 Hypotension, unspecified: Secondary | ICD-10-CM

## 2016-11-04 DIAGNOSIS — R000 Tachycardia, unspecified: Secondary | ICD-10-CM | POA: Diagnosis present

## 2016-11-04 DIAGNOSIS — R6510 Systemic inflammatory response syndrome (SIRS) of non-infectious origin without acute organ dysfunction: Secondary | ICD-10-CM

## 2016-11-04 DIAGNOSIS — B970 Adenovirus as the cause of diseases classified elsewhere: Secondary | ICD-10-CM | POA: Diagnosis present

## 2016-11-04 DIAGNOSIS — A0472 Enterocolitis due to Clostridium difficile, not specified as recurrent: Secondary | ICD-10-CM | POA: Diagnosis present

## 2016-11-04 DIAGNOSIS — E860 Dehydration: Secondary | ICD-10-CM | POA: Diagnosis present

## 2016-11-04 DIAGNOSIS — L989 Disorder of the skin and subcutaneous tissue, unspecified: Secondary | ICD-10-CM | POA: Diagnosis present

## 2016-11-04 DIAGNOSIS — G40909 Epilepsy, unspecified, not intractable, without status epilepticus: Secondary | ICD-10-CM | POA: Diagnosis present

## 2016-11-04 DIAGNOSIS — B9729 Other coronavirus as the cause of diseases classified elsewhere: Secondary | ICD-10-CM | POA: Diagnosis present

## 2016-11-04 DIAGNOSIS — J069 Acute upper respiratory infection, unspecified: Principal | ICD-10-CM | POA: Diagnosis present

## 2016-11-04 DIAGNOSIS — B20 Human immunodeficiency virus [HIV] disease: Secondary | ICD-10-CM | POA: Diagnosis present

## 2016-11-04 DIAGNOSIS — Z79899 Other long term (current) drug therapy: Secondary | ICD-10-CM

## 2016-11-04 DIAGNOSIS — Z87891 Personal history of nicotine dependence: Secondary | ICD-10-CM

## 2016-11-04 DIAGNOSIS — E872 Acidosis: Secondary | ICD-10-CM | POA: Diagnosis present

## 2016-11-04 LAB — URINALYSIS, ROUTINE W REFLEX MICROSCOPIC
Bilirubin Urine: NEGATIVE
Glucose, UA: NEGATIVE mg/dL
Hgb urine dipstick: NEGATIVE
Ketones, ur: NEGATIVE mg/dL
Leukocytes, UA: NEGATIVE
Nitrite: NEGATIVE
Protein, ur: NEGATIVE mg/dL
Specific Gravity, Urine: 1.025 (ref 1.005–1.030)
pH: 5 (ref 5.0–8.0)

## 2016-11-04 LAB — COMPREHENSIVE METABOLIC PANEL
ALT: 14 U/L — ABNORMAL LOW (ref 17–63)
AST: 21 U/L (ref 15–41)
Albumin: 3.8 g/dL (ref 3.5–5.0)
Alkaline Phosphatase: 58 U/L (ref 38–126)
Anion gap: 12 (ref 5–15)
BUN: 12 mg/dL (ref 6–20)
CO2: 26 mmol/L (ref 22–32)
Calcium: 9.6 mg/dL (ref 8.9–10.3)
Chloride: 99 mmol/L — ABNORMAL LOW (ref 101–111)
Creatinine, Ser: 1.08 mg/dL (ref 0.61–1.24)
GFR calc Af Amer: >60 mL/min (ref >60)
GFR calc non Af Amer: >60 mL/min (ref >60)
Glucose, Bld: 78 mg/dL (ref 65–99)
Potassium: 3.7 mmol/L (ref 3.5–5.1)
Sodium: 137 mmol/L (ref 135–145)
Total Bilirubin: 0.6 mg/dL (ref 0.3–1.2)
Total Protein: 9.8 g/dL — ABNORMAL HIGH (ref 6.5–8.1)

## 2016-11-04 LAB — I-STAT CG4 LACTIC ACID, ED: Lactic Acid, Venous: 2.31 mmol/L (ref 0.5–1.9)

## 2016-11-04 LAB — INFLUENZA PANEL BY PCR (TYPE A & B)
Influenza A By PCR: NEGATIVE
Influenza B By PCR: NEGATIVE

## 2016-11-04 LAB — LIPASE, BLOOD: Lipase: 11 U/L (ref 11–51)

## 2016-11-04 LAB — CBC
HCT: 37.5 % — ABNORMAL LOW (ref 39.0–52.0)
Hemoglobin: 12.7 g/dL — ABNORMAL LOW (ref 13.0–17.0)
MCH: 31.7 pg (ref 26.0–34.0)
MCHC: 33.9 g/dL (ref 30.0–36.0)
MCV: 93.5 fL (ref 78.0–100.0)
Platelets: 243 10*3/uL (ref 150–400)
RBC: 4.01 MIL/uL — ABNORMAL LOW (ref 4.22–5.81)
RDW: 14.4 % (ref 11.5–15.5)
WBC: 6.7 10*3/uL (ref 4.0–10.5)

## 2016-11-04 LAB — HIV-1 RNA QUANT-NO REFLEX-BLD
HIV 1 RNA Quant: 152 copies/mL — ABNORMAL HIGH
HIV-1 RNA Quant, Log: 2.18 Log copies/mL — ABNORMAL HIGH

## 2016-11-04 MED ORDER — SODIUM CHLORIDE 0.9 % IV BOLUS (SEPSIS)
1000.0000 mL | Freq: Once | INTRAVENOUS | Status: AC
  Administered 2016-11-04: 1000 mL via INTRAVENOUS

## 2016-11-04 MED ORDER — VANCOMYCIN HCL IN DEXTROSE 1-5 GM/200ML-% IV SOLN
1000.0000 mg | Freq: Three times a day (TID) | INTRAVENOUS | Status: DC
  Administered 2016-11-05: 1000 mg via INTRAVENOUS
  Filled 2016-11-04 (×3): qty 200

## 2016-11-04 MED ORDER — VANCOMYCIN HCL IN DEXTROSE 1-5 GM/200ML-% IV SOLN: 1000.0000 mg | Freq: Once | INTRAVENOUS | Status: DC

## 2016-11-04 MED ORDER — ACETAMINOPHEN 325 MG PO TABS
650.0000 mg | ORAL_TABLET | Freq: Once | ORAL | Status: AC
  Administered 2016-11-04: 650 mg via ORAL
  Filled 2016-11-04: qty 2

## 2016-11-04 MED ORDER — PIPERACILLIN-TAZOBACTAM 3.375 G IVPB 30 MIN
3.3750 g | Freq: Once | INTRAVENOUS | Status: AC
  Administered 2016-11-04: 3.375 g via INTRAVENOUS
  Filled 2016-11-04: qty 50

## 2016-11-04 MED ORDER — PIPERACILLIN-TAZOBACTAM 3.375 G IVPB
3.3750 g | Freq: Three times a day (TID) | INTRAVENOUS | Status: DC
  Administered 2016-11-05: 3.375 g via INTRAVENOUS
  Filled 2016-11-04 (×2): qty 50

## 2016-11-04 MED ORDER — VANCOMYCIN HCL 10 G IV SOLR
1500.0000 mg | Freq: Once | INTRAVENOUS | Status: AC
  Administered 2016-11-04: 1500 mg via INTRAVENOUS
  Filled 2016-11-04: qty 1500

## 2016-11-04 MED ORDER — ACETAMINOPHEN 500 MG PO TABS
1000.0000 mg | ORAL_TABLET | Freq: Once | ORAL | Status: AC
  Administered 2016-11-04: 1000 mg via ORAL
  Filled 2016-11-04: qty 2

## 2016-11-04 NOTE — ED Provider Notes (Signed)
[facility]-EMERGENCY DEPT Provider Note   CSN: [id number] Arrival date & time: 11/04/16  2006     History   Chief Complaint Chief Complaint  Patient presents with  . Generalized Body Aches    HPI [name] [name] is a 21 y.o. male with a pmh of AIDS and syphillis who presents with flu-like sxs. His last CD4 count was 110 and his HIV quant viral load was 152. The patient is on bactrim. Today he developed body aches, chills, and rigors. He was afraid that he was going to have a seizure.Upon arrival the patient was noted to be febrile. He denies any cough, chest pain, SOB, sore throat, rash. He has a mild headache, but denies photo/phonophobia stiff neck.  HPI  Past Medical History:  Diagnosis Date  . Hepatitis B immune   . HIV disease (HCC)   . Immune deficiency disorder (HCC)   . 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 Syphilis     Patient Active Problem List   Diagnosis Date Noted  . Shingles 09/14/2016  . 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 Depression 09/08/2016  . AIDS (HCC)   . Seizure (HCC) 08/29/2016    History reviewed. No pertinent surgical history.     Home Medications    Prior to Admission medications   Medication Sig Start Date End Date Taking? Authorizing Provider  darunavir-cobicistat (PREZCOBIX) 800-150 MG tablet Take 1 tablet by mouth daily with breakfast. Swallow whole. Do NOT crush, break or chew tablets. Take with food. 09/02/16   Cliffton AstersJohn Campbell, MD  emtricitabine-tenofovir AF (DESCOVY) 200-25 MG tablet Take 1 tablet by mouth daily. 09/02/16   Cliffton AstersJohn Campbell, MD  fluconazole (DIFLUCAN) 100 MG tablet Take 100 mg by mouth daily. Started on 08-23-16 for 14 days    Historical Provider, MD  hydrocortisone cream 1 % Apply topically 3 (three) times daily as needed for itching. Patient not taking: Reported on 09/14/2016 09/01/16   Reymundo Pollarolyn Guilloud, MD  ibuprofen (ADVIL,MOTRIN) 200 MG tablet Take 400 mg by mouth every 6 (six) hours as needed  for headache or moderate pain.    Historical Provider, MD  levETIRAcetam (KEPPRA) 500 MG tablet Take 1 tablet (500 mg total) by mouth 2 (two) times daily. 10/27/16   Van ClinesKaren M Aquino, MD  Pseudoeph-Doxylamine-DM-APAP (NYQUIL PO) Take 2 capsules by mouth as needed (for cough and congestion).    Historical Provider, MD  sulfamethoxazole-trimethoprim (BACTRIM DS,SEPTRA DS) 800-160 MG tablet Take 1 tablet by mouth daily. Patient not taking: Reported on 09/14/2016 09/02/16   Ginnie SmartJeffrey C Hatcher, MD  valACYclovir (VALTREX) 1000 MG tablet Take 1 tablet (1,000 mg total) by mouth 2 (two) times daily. 10/27/16   Ginnie SmartJeffrey C Hatcher, MD    Family History No family history on file.  Social History Social History  Substance Use Topics  . Smoking status: Current Every Day Smoker    Packs/day: 0.50    Types: Cigarettes  . Smokeless tobacco: Never Used     Comment: trying to cut back   . Alcohol use No     Allergies   Patient has no known allergies.   Review of Systems Review of Systems  Ten systems reviewed and are negative for acute change, except as noted in the HPI.   Physical Exam Updated Vital Signs BP 106/77   Pulse 120   Temp 101.1 F (38.4 C)   Resp 18   Ht 6\' 7"  (2.007 m)   Wt 84.8 kg   SpO2 100%  BMI 21.07 kg/m   Physical Exam  Constitutional: He appears well-developed and well-nourished. No distress.  HENT:  Head: Normocephalic and atraumatic.  Eyes: Conjunctivae are normal. No scleral icterus.  Neck: Normal range of motion. Neck supple.  Cardiovascular: Normal rate, regular rhythm and normal heart sounds.   Pulmonary/Chest: Effort normal and breath sounds normal. No respiratory distress.  Abdominal: Soft. There is no tenderness.  Musculoskeletal: He exhibits no edema.  Neurological: He is alert.  Skin: Skin is warm and dry. He is not diaphoretic.  Psychiatric: His behavior is normal.  Nursing note and vitals reviewed.    ED Treatments / Results  Labs (all labs  ordered are listed, but only abnormal results are displayed) Labs Reviewed  COMPREHENSIVE METABOLIC PANEL - Abnormal; Notable for the following:       Result Value   Chloride 99 (*)    Total Protein 9.8 (*)    ALT 14 (*)    All other components within normal limits  CBC - Abnormal; Notable for the following:    RBC 4.01 (*)    Hemoglobin 12.7 (*)    HCT 37.5 (*)    All other components within normal limits  I-STAT CG4 LACTIC ACID, ED - Abnormal; Notable for the following:    Lactic Acid, Venous 2.31 (*)    All other components within normal limits  RESPIRATORY PANEL BY PCR  CULTURE, BLOOD (ROUTINE X 2)  CULTURE, BLOOD (ROUTINE X 2)  LIPASE, BLOOD  URINALYSIS, ROUTINE W REFLEX MICROSCOPIC  INFLUENZA PANEL BY PCR (TYPE A & B)    EKG  EKG Interpretation None       Radiology Dg Chest 2 View  Result Date: 11/04/2016 CLINICAL DATA:  Chest pain EXAM: CHEST  2 VIEW COMPARISON:  08/29/2016 FINDINGS: The heart size and mediastinal contours are within normal limits. Both lungs are clear. The visualized skeletal structures are unremarkable. IMPRESSION: No active cardiopulmonary disease. Electronically Signed   By: Alcide Clever M.D.   On: 11/04/2016 21:13    Procedures Procedures (including critical care time)  Medications Ordered in ED Medications  sodium chloride 0.9 % bolus 1,000 mL (1,000 mLs Intravenous New Bag/Given 11/04/16 2211)    And  sodium chloride 0.9 % bolus 1,000 mL (not administered)    And  sodium chloride 0.9 % bolus 1,000 mL (not administered)  piperacillin-tazobactam (ZOSYN) IVPB 3.375 g (3.375 g Intravenous New Bag/Given 11/04/16 2211)  vancomycin (VANCOCIN) 1,500 mg in sodium chloride 0.9 % 500 mL IVPB (not administered)  acetaminophen (TYLENOL) tablet 1,000 mg (not administered)  piperacillin-tazobactam (ZOSYN) IVPB 3.375 g (not administered)  vancomycin (VANCOCIN) IVPB 1000 mg/200 mL premix (not administered)  acetaminophen (TYLENOL) tablet 650 mg (650 mg  Oral Given 11/04/16 2027)     Initial Impression / Assessment and Plan / ED Course  I have reviewed the triage vital signs and the nursing notes.  Pertinent labs & imaging results that were available during my care of the patient were reviewed by me and considered in my medical decision making (see chart for details).  Clinical Course as of Nov 05 30  Sat Nov 05, 2016  0003 Lactic Acid, Venous: (!!) 2.31 [AH]  0003 WBC: 6.7 [AH]  0004 No  abnormality  DG Chest 2 View [AH]  0004 Influenza A By PCR: NEGATIVE [AH]  0004 Patient with fever, tachycardia, and elevated lactic acid. Given he meets sirs vs. Sepsis and is immunocompromised I feel he should be admitted for observation. Influenza B By  PCR: NEGATIVE [AH]    Clinical Course User Index [AH] Arthor Captain, PA-C    The patient will be admitted for observation.  Lactic acid has improved.   Final Clinical Impressions(s) / ED Diagnoses   Final diagnoses:  SIRS (systemic inflammatory response syndrome) [facility])    New Prescriptions New Prescriptions   No medications on file     Arthor Captain, PA-C 11/05/16 0033    Melene Plan, DO 11/07/16 [phone]

## 2016-11-04 NOTE — Progress Notes (Signed)
Pharmacy Antibiotic Note  [name] [name] is a 21 y.o. male admitted on 11/04/2016 with sepsis. Pt presents with general body aches and has an elevated lactate.  Pharmacy has been consulted for vancomycin and Zosyn dosing.  Plan: -Zosyn 3.375g IV x1 over 30 min then q8h EI -Vancomycin 1500mg  IV x1 then 1000mg  IV q8h -Monitor renal function, LOT, cultures, vancomycin level as indicated  Height: 6\' 7"  (200.7 cm) Weight: 187 lb (84.8 kg) IBW/kg (Calculated) : 93.7  Temp (24hrs), Avg:101.1 F (38.4 C), Min:101.1 F (38.4 C), Max:101.1 F (38.4 C)   Recent Labs Lab 11/01/16 1601 11/04/16 2036 11/04/16 2047  WBC 5.3 6.7  --   CREATININE 1.00  --   --   LATICACIDVEN  --   --  2.31*    Estimated Creatinine Clearance: 139 mL/min (by C-G formula based on SCr of 1 mg/dL).    No Known Allergies  Antimicrobials this admission: 2/9 Zosyn >>  2/9 Vancomycin >>   Dose adjustments this admission: none  Microbiology results: 2/9 BCx: IP  Thank you for allowing pharmacy to be a part of this patient's care.  Fredonia HighlandMichael Lanika Colgate, PharmD PGY-1 Pharmacy Resident Pager: [phone] 11/04/2016

## 2016-11-04 NOTE — ED Notes (Signed)
Aware of lactic acid, will room patient in next appropriate room

## 2016-11-04 NOTE — ED Triage Notes (Signed)
The pt is c/o general body aches   Feels jittery headache cough chest soreness  Unknown  Temp.

## 2016-11-04 NOTE — ED Notes (Signed)
NF [place] and Dr Adela LankFloyd informed of lactic acid results 2.31

## 2016-11-05 ENCOUNTER — Encounter (HOSPITAL_COMMUNITY): Payer: Self-pay | Admitting: Internal Medicine

## 2016-11-05 DIAGNOSIS — B970 Adenovirus as the cause of diseases classified elsewhere: Secondary | ICD-10-CM | POA: Diagnosis present

## 2016-11-05 DIAGNOSIS — R6510 Systemic inflammatory response syndrome (SIRS) of non-infectious origin without acute organ dysfunction: Secondary | ICD-10-CM

## 2016-11-05 DIAGNOSIS — G40909 Epilepsy, unspecified, not intractable, without status epilepticus: Secondary | ICD-10-CM | POA: Diagnosis present

## 2016-11-05 DIAGNOSIS — Z79899 Other long term (current) drug therapy: Secondary | ICD-10-CM | POA: Diagnosis not present

## 2016-11-05 DIAGNOSIS — J069 Acute upper respiratory infection, unspecified: Secondary | ICD-10-CM | POA: Diagnosis present

## 2016-11-05 DIAGNOSIS — E872 Acidosis: Secondary | ICD-10-CM | POA: Diagnosis present

## 2016-11-05 DIAGNOSIS — Z87891 Personal history of nicotine dependence: Secondary | ICD-10-CM | POA: Diagnosis not present

## 2016-11-05 DIAGNOSIS — B9729 Other coronavirus as the cause of diseases classified elsewhere: Secondary | ICD-10-CM | POA: Diagnosis present

## 2016-11-05 DIAGNOSIS — A0472 Enterocolitis due to Clostridium difficile, not specified as recurrent: Secondary | ICD-10-CM | POA: Diagnosis present

## 2016-11-05 DIAGNOSIS — R000 Tachycardia, unspecified: Secondary | ICD-10-CM | POA: Diagnosis present

## 2016-11-05 DIAGNOSIS — R531 Weakness: Secondary | ICD-10-CM | POA: Diagnosis present

## 2016-11-05 DIAGNOSIS — E860 Dehydration: Secondary | ICD-10-CM | POA: Diagnosis present

## 2016-11-05 DIAGNOSIS — I959 Hypotension, unspecified: Secondary | ICD-10-CM

## 2016-11-05 DIAGNOSIS — L989 Disorder of the skin and subcutaneous tissue, unspecified: Secondary | ICD-10-CM | POA: Diagnosis present

## 2016-11-05 DIAGNOSIS — B20 Human immunodeficiency virus [HIV] disease: Secondary | ICD-10-CM | POA: Diagnosis present

## 2016-11-05 LAB — CLOSTRIDIUM DIFFICILE BY PCR: Toxigenic C. Difficile by PCR: NEGATIVE

## 2016-11-05 LAB — RESPIRATORY PANEL BY PCR
Adenovirus: NOT DETECTED
Adenovirus: DETECTED — AB
Bordetella pertussis: NOT DETECTED
Bordetella pertussis: NOT DETECTED
Chlamydophila pneumoniae: NOT DETECTED
Chlamydophila pneumoniae: NOT DETECTED
Coronavirus 229E: NOT DETECTED
Coronavirus 229E: NOT DETECTED
Coronavirus HKU1: NOT DETECTED
Coronavirus HKU1: NOT DETECTED
Coronavirus NL63: NOT DETECTED
Coronavirus NL63: DETECTED — AB
Coronavirus OC43: NOT DETECTED
Coronavirus OC43: NOT DETECTED
Influenza A: NOT DETECTED
Influenza A: NOT DETECTED
Influenza B: NOT DETECTED
Influenza B: NOT DETECTED
Metapneumovirus: NOT DETECTED
Metapneumovirus: NOT DETECTED
Mycoplasma pneumoniae: NOT DETECTED
Mycoplasma pneumoniae: NOT DETECTED
Parainfluenza Virus 1: NOT DETECTED
Parainfluenza Virus 1: NOT DETECTED
Parainfluenza Virus 2: NOT DETECTED
Parainfluenza Virus 2: NOT DETECTED
Parainfluenza Virus 3: NOT DETECTED
Parainfluenza Virus 3: NOT DETECTED
Parainfluenza Virus 4: NOT DETECTED
Parainfluenza Virus 4: NOT DETECTED
Respiratory Syncytial Virus: NOT DETECTED
Respiratory Syncytial Virus: NOT DETECTED
Rhinovirus / Enterovirus: NOT DETECTED
Rhinovirus / Enterovirus: NOT DETECTED

## 2016-11-05 LAB — I-STAT CG4 LACTIC ACID, ED: Lactic Acid, Venous: 1.85 mmol/L (ref 0.5–1.9)

## 2016-11-05 LAB — COMPREHENSIVE METABOLIC PANEL
ALT: 12 U/L — ABNORMAL LOW (ref 17–63)
ALT: 11 U/L — ABNORMAL LOW (ref 17–63)
AST: 18 U/L (ref 15–41)
AST: 18 U/L (ref 15–41)
Albumin: 3 g/dL — ABNORMAL LOW (ref 3.5–5.0)
Albumin: 3 g/dL — ABNORMAL LOW (ref 3.5–5.0)
Alkaline Phosphatase: 50 U/L (ref 38–126)
Alkaline Phosphatase: 50 U/L (ref 38–126)
Anion gap: 11 (ref 5–15)
Anion gap: 5 (ref 5–15)
BUN: 9 mg/dL (ref 6–20)
BUN: 8 mg/dL (ref 6–20)
CO2: 22 mmol/L (ref 22–32)
CO2: 24 mmol/L (ref 22–32)
Calcium: 8.2 mg/dL — ABNORMAL LOW (ref 8.9–10.3)
Calcium: 8.2 mg/dL — ABNORMAL LOW (ref 8.9–10.3)
Chloride: 101 mmol/L (ref 101–111)
Chloride: 108 mmol/L (ref 101–111)
Creatinine, Ser: 1.17 mg/dL (ref 0.61–1.24)
Creatinine, Ser: 1.06 mg/dL (ref 0.61–1.24)
GFR calc Af Amer: >60 mL/min (ref >60)
GFR calc Af Amer: >60 mL/min (ref >60)
GFR calc non Af Amer: >60 mL/min (ref >60)
GFR calc non Af Amer: >60 mL/min (ref >60)
Glucose, Bld: 101 mg/dL — ABNORMAL HIGH (ref 65–99)
Glucose, Bld: 78 mg/dL (ref 65–99)
Potassium: 3.7 mmol/L (ref 3.5–5.1)
Potassium: 3.4 mmol/L — ABNORMAL LOW (ref 3.5–5.1)
Sodium: 134 mmol/L — ABNORMAL LOW (ref 135–145)
Sodium: 137 mmol/L (ref 135–145)
Total Bilirubin: 0.6 mg/dL (ref 0.3–1.2)
Total Bilirubin: 0.6 mg/dL (ref 0.3–1.2)
Total Protein: 7.7 g/dL (ref 6.5–8.1)
Total Protein: 7.5 g/dL (ref 6.5–8.1)

## 2016-11-05 LAB — CBC WITH DIFFERENTIAL/PLATELET
Basophils Absolute: 0 10*3/uL (ref 0.0–0.1)
Basophils Relative: 0 %
Eosinophils Absolute: 0.3 10*3/uL (ref 0.0–0.7)
Eosinophils Relative: 7 %
HCT: 33.2 % — ABNORMAL LOW (ref 39.0–52.0)
Hemoglobin: 11.1 g/dL — ABNORMAL LOW (ref 13.0–17.0)
Lymphocytes Relative: 12 %
Lymphs Abs: 0.6 10*3/uL — ABNORMAL LOW (ref 0.7–4.0)
MCH: 31.1 pg (ref 26.0–34.0)
MCHC: 33.4 g/dL (ref 30.0–36.0)
MCV: 93 fL (ref 78.0–100.0)
Monocytes Absolute: 0.1 10*3/uL (ref 0.1–1.0)
Monocytes Relative: 3 %
Neutro Abs: 3.9 10*3/uL (ref 1.7–7.7)
Neutrophils Relative %: 78 %
Platelets: 216 10*3/uL (ref 150–400)
RBC: 3.57 MIL/uL — ABNORMAL LOW (ref 4.22–5.81)
RDW: 14.2 % (ref 11.5–15.5)
WBC: 5 10*3/uL (ref 4.0–10.5)

## 2016-11-05 LAB — C DIFFICILE QUICK SCREEN W PCR REFLEX
C Diff antigen: POSITIVE — AB
C Diff toxin: NEGATIVE

## 2016-11-05 LAB — GLUCOSE, CAPILLARY: Glucose-Capillary: 93 mg/dL (ref 65–99)

## 2016-11-05 LAB — APTT: aPTT: 35 seconds (ref 24–36)

## 2016-11-05 LAB — LACTIC ACID, PLASMA
Lactic Acid, Venous: 1.1 mmol/L (ref 0.5–1.9)
Lactic Acid, Venous: 0.7 mmol/L (ref 0.5–1.9)

## 2016-11-05 LAB — CRYPTOCOCCAL ANTIGEN: Crypto Ag: NEGATIVE

## 2016-11-05 LAB — TROPONIN I
Troponin I: <0.03 ng/mL (ref <0.03)
Troponin I: <0.03 ng/mL (ref <0.03)
Troponin I: <0.03 ng/mL (ref <0.03)

## 2016-11-05 LAB — PROTIME-INR
INR: 1.22
Prothrombin Time: 15.5 seconds — ABNORMAL HIGH (ref 11.4–15.2)

## 2016-11-05 LAB — PROCALCITONIN: Procalcitonin: <0.1 ng/mL

## 2016-11-05 MED ORDER — VANCOMYCIN HCL IN DEXTROSE 1-5 GM/200ML-% IV SOLN: 1000.0000 mg | Freq: Once | INTRAVENOUS | Status: DC

## 2016-11-05 MED ORDER — ONDANSETRON HCL 4 MG/2ML IJ SOLN: 4.0000 mg | Freq: Four times a day (QID) | INTRAMUSCULAR | Status: DC | PRN

## 2016-11-05 MED ORDER — SODIUM CHLORIDE 0.9 % IV SOLN
INTRAVENOUS | Status: DC
  Administered 2016-11-05 (×2): via INTRAVENOUS

## 2016-11-05 MED ORDER — ACETAMINOPHEN 325 MG PO TABS
650.0000 mg | ORAL_TABLET | Freq: Four times a day (QID) | ORAL | Status: DC | PRN
  Administered 2016-11-05: 650 mg via ORAL
  Filled 2016-11-05: qty 2

## 2016-11-05 MED ORDER — PIPERACILLIN-TAZOBACTAM 3.375 G IVPB 30 MIN: 3.3750 g | Freq: Once | INTRAVENOUS | Status: DC

## 2016-11-05 MED ORDER — HYDROCORTISONE 1 % EX CREA: TOPICAL_CREAM | Freq: Three times a day (TID) | CUTANEOUS | Status: DC | PRN

## 2016-11-05 MED ORDER — TRAZODONE HCL 50 MG PO TABS
50.0000 mg | ORAL_TABLET | Freq: Every evening | ORAL | Status: DC | PRN
  Administered 2016-11-05: 50 mg via ORAL
  Filled 2016-11-05: qty 1

## 2016-11-05 MED ORDER — ONDANSETRON HCL 4 MG PO TABS: 4.0000 mg | ORAL_TABLET | Freq: Four times a day (QID) | ORAL | Status: DC | PRN

## 2016-11-05 MED ORDER — DARUNAVIR-COBICISTAT 800-150 MG PO TABS
1.0000 | ORAL_TABLET | Freq: Every day | ORAL | Status: DC
  Administered 2016-11-05 – 2016-11-06 (×2): 1 via ORAL
  Filled 2016-11-05 (×2): qty 1

## 2016-11-05 MED ORDER — ACETAMINOPHEN 650 MG RE SUPP
650.0000 mg | Freq: Four times a day (QID) | RECTAL | Status: DC | PRN
Start: 2016-11-05 — End: 2016-11-05

## 2016-11-05 MED ORDER — LEVETIRACETAM 500 MG PO TABS
500.0000 mg | ORAL_TABLET | Freq: Two times a day (BID) | ORAL | Status: DC
  Administered 2016-11-05 – 2016-11-06 (×3): 500 mg via ORAL
  Filled 2016-11-05 (×3): qty 1

## 2016-11-05 MED ORDER — DEXTROMETHORPHAN POLISTIREX ER 30 MG/5ML PO SUER
30.0000 mg | Freq: Once | ORAL | Status: AC
  Administered 2016-11-05: 30 mg via ORAL
  Filled 2016-11-05: qty 5

## 2016-11-05 MED ORDER — EMTRICITABINE-TENOFOVIR AF 200-25 MG PO TABS
1.0000 | ORAL_TABLET | Freq: Every day | ORAL | Status: DC
  Administered 2016-11-05 – 2016-11-06 (×2): 1 via ORAL
  Filled 2016-11-05 (×2): qty 1

## 2016-11-05 MED ORDER — SULFAMETHOXAZOLE-TRIMETHOPRIM 800-160 MG PO TABS
1.0000 | ORAL_TABLET | Freq: Every day | ORAL | Status: DC
  Administered 2016-11-05 – 2016-11-06 (×2): 1 via ORAL
  Filled 2016-11-05 (×2): qty 1

## 2016-11-05 MED ORDER — ENOXAPARIN SODIUM 40 MG/0.4ML ~~LOC~~ SOLN
40.0000 mg | SUBCUTANEOUS | Status: DC
  Administered 2016-11-05 – 2016-11-06 (×2): 40 mg via SUBCUTANEOUS
  Filled 2016-11-05 (×2): qty 0.4

## 2016-11-05 MED ORDER — VALACYCLOVIR HCL 500 MG PO TABS
1000.0000 mg | ORAL_TABLET | Freq: Two times a day (BID) | ORAL | Status: DC
  Administered 2016-11-05 – 2016-11-06 (×3): 1000 mg via ORAL
  Filled 2016-11-05 (×3): qty 2

## 2016-11-05 MED ORDER — DIPHENHYDRAMINE HCL 12.5 MG/5ML PO ELIX
12.5000 mg | ORAL_SOLUTION | Freq: Once | ORAL | Status: AC
  Administered 2016-11-05: 12.5 mg via ORAL
  Filled 2016-11-05: qty 5

## 2016-11-05 MED ORDER — HYDROCODONE-ACETAMINOPHEN 5-325 MG PO TABS
1.0000 | ORAL_TABLET | ORAL | Status: DC | PRN
  Administered 2016-11-05: 2 via ORAL
  Filled 2016-11-05: qty 2

## 2016-11-05 MED ORDER — ENOXAPARIN SODIUM 40 MG/0.4ML ~~LOC~~ SOLN: 40.0000 mg | SUBCUTANEOUS | Status: DC

## 2016-11-05 NOTE — ED Notes (Signed)
Attempted report x1. 

## 2016-11-05 NOTE — H&P (Signed)
History and Physical    [name] [name] ZOX:[id number] DOB: 09-07-1996 DOA: 11/04/2016  Referring MD/NP/PA: EDP PCP: No PCP Per Patient  Outpatient Specialists: Dr. Johny SaxJeffrey Hatcher (ID) Patient coming from: Home  Chief Complaint: Generalized weakness  HPI: [name] Bakeraron Bradeen is a 21 y.o. male with medical history significant for seizure disorder, saphenous, AIDS with CD4 count and Viral load of 110 and 152 respectively on 11/01/2016. He is on Bactrim for PCP for prophylaxis.  He presented was completed with one-day history of sudden onset of generalized weakness with generalized body aches associated with fever, chills and rigor. No history of cough or sputum production, no chest pain, no shortness of breath, no sore throat or any significant upper respiratory symptoms of note. Denies any rash, no neck pain or neck stiffness, no mental status change, no photophobia or phonophobia.  ED Course: At the ED patient was noted to be hypotensive and tachycardic with fever 101.54F and elevated lactic acidosis of 2.31. Evista 6 rate and urinalysis were unremarkable for any obvious source of infection.He was given IV fluid boluses with IV antibiotics with improvement, and actually his follow-up lactic acid level normalized.  Patient admitted further management.  Review of Systems: As per HPI otherwise 10 point review of systems negative.   Past Medical History:  Diagnosis Date  . Hepatitis B immune   . HIV disease (HCC)   . Immune deficiency disorder (HCC)   . 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 Syphilis     History reviewed. No pertinent surgical history.   reports that he has been smoking Cigarettes.  He has been smoking about 0.50 packs per day. He has never used smokeless tobacco. He reports that he uses drugs, including Marijuana. He reports that he does not drink alcohol.  No Known Allergies  No family history on file.   Prior to Admission medications    Medication Sig Start Date End Date Taking? Authorizing Provider  darunavir-cobicistat (PREZCOBIX) 800-150 MG tablet Take 1 tablet by mouth daily with breakfast. Swallow whole. Do NOT crush, break or chew tablets. Take with food. 09/02/16  Yes Cliffton AstersJohn Campbell, MD  emtricitabine-tenofovir AF (DESCOVY) 200-25 MG tablet Take 1 tablet by mouth daily. 09/02/16  Yes Cliffton AstersJohn Campbell, MD  levETIRAcetam (KEPPRA) 500 MG tablet Take 1 tablet (500 mg total) by mouth 2 (two) times daily. 10/27/16  Yes Van ClinesKaren M Aquino, MD  valACYclovir (VALTREX) 1000 MG tablet Take 1 tablet (1,000 mg total) by mouth 2 (two) times daily. 10/27/16  Yes Ginnie SmartJeffrey C Hatcher, MD  hydrocortisone cream 1 % Apply topically 3 (three) times daily as needed for itching. Patient not taking: Reported on 09/14/2016 09/01/16   Reymundo Pollarolyn Guilloud, MD  sulfamethoxazole-trimethoprim (BACTRIM DS,SEPTRA DS) 800-160 MG tablet Take 1 tablet by mouth daily. Patient not taking: Reported on 09/14/2016 09/02/16   Ginnie SmartJeffrey C Hatcher, MD    Physical Exam: Vitals:   11/05/16 0049 11/05/16 0100 11/05/16 0115 11/05/16 0130  BP: (!) 95/51 108/56 110/56 (!) 113/51  Pulse:   100 109  Resp: 24 19 17 15   Temp:      TempSrc:      SpO2:   100% 100%  Weight:      Height:          Constitutional: NAD, calm, comfortable Vitals:   11/05/16 0049 11/05/16 0100 11/05/16 0115 11/05/16 0130  BP: (!) 95/51 108/56 110/56 (!) 113/51  Pulse:   100 109  Resp: 24 19 17  15  Temp:      TempSrc:      SpO2:   100% 100%  Weight:      Height:       Eyes: PERRL, lids and conjunctivae normal ENMT: Mucous membranes are Mildly Dry.Posterior pharynx clear of any exudate or lesions.Normal dentition.  Neck: normal, supple, no masses, no thyromegaly Respiratory: clear to auscultation bilaterally, no wheezing, no crackles. Normal respiratory effort. No accessory muscle use.  Cardiovascular: Mildly tachycardic with Regular rhythm, no murmurs / rubs / gallops. No extremity edema. 2+ pedal  pulses. No carotid bruits.  Abdomen: no tenderness, no masses palpated. No hepatosplenomegaly. Bowel sounds positive.  Musculoskeletal: no clubbing / cyanosis. No joint deformity upper and lower extremities. Good ROM, no contractures. Normal muscle tone.  Skin: no rashes, lesions, ulcers. No induration Neurologic:  Alert, oriented 3, no obvious focal deficit  Psychiatric: Normal judgment.Normal mood.    Labs on Admission: I have personally reviewed following labs and imaging studies  CBC:  Recent Labs Lab 11/01/16 1601 11/04/16 2036  WBC 5.3 6.7  HGB 12.5* 12.7*  HCT 37.5* 37.5*  MCV 94.5 93.5  PLT 270 243   Basic Metabolic Panel:  Recent Labs Lab 11/01/16 1601 11/04/16 2036  NA 138 137  K 4.0 3.7  CL 103 99*  CO2 28 26  GLUCOSE 76 78  BUN 14 12  CREATININE 1.00 1.08  CALCIUM 9.9 9.6   GFR: Estimated Creatinine Clearance: 128.7 mL/min (by C-G formula based on SCr of 1.08 mg/dL). Liver Function Tests:  Recent Labs Lab 11/01/16 1601 11/04/16 2036  AST 16 21  ALT 10 14*  ALKPHOS 55 58  BILITOT 0.3 0.6  PROT 9.1* 9.8*  ALBUMIN 4.3 3.8    Recent Labs Lab 11/04/16 2036  LIPASE 11   No results for input(s): AMMONIA in the last 168 hours. Coagulation Profile: No results for input(s): INR, PROTIME in the last 168 hours. Cardiac Enzymes: No results for input(s): CKTOTAL, CKMB, CKMBINDEX, TROPONINI in the last 168 hours. BNP (last 3 results) No results for input(s): PROBNP in the last 8760 hours. HbA1C: No results for input(s): HGBA1C in the last 72 hours. CBG: No results for input(s): GLUCAP in the last 168 hours. Lipid Profile: No results for input(s): CHOL, HDL, LDLCALC, TRIG, CHOLHDL, LDLDIRECT in the last 72 hours. Thyroid Function Tests: No results for input(s): TSH, T4TOTAL, FREET4, T3FREE, THYROIDAB in the last 72 hours. Anemia Panel: No results for input(s): VITAMINB12, FOLATE, FERRITIN, TIBC, IRON, RETICCTPCT in the last 72 hours. Urine  analysis:    Component Value Date/Time   COLORURINE YELLOW 11/04/2016 2031   APPEARANCEUR CLEAR 11/04/2016 2031   LABSPEC 1.025 11/04/2016 2031   PHURINE 5.0 11/04/2016 2031   GLUCOSEU NEGATIVE 11/04/2016 2031   HGBUR NEGATIVE 11/04/2016 2031   BILIRUBINUR NEGATIVE 11/04/2016 2031   KETONESUR NEGATIVE 11/04/2016 2031   PROTEINUR NEGATIVE 11/04/2016 2031   NITRITE NEGATIVE 11/04/2016 2031   LEUKOCYTESUR NEGATIVE 11/04/2016 2031   Sepsis Labs: @LABRCNTIP (procalcitonin:4,lacticidven:4) )No results found for this or any previous visit (from the past 240 hour(s)).   Radiological Exams on Admission: Dg Chest 2 View  Result Date: 11/04/2016 CLINICAL DATA:  Chest pain EXAM: CHEST  2 VIEW COMPARISON:  08/29/2016 FINDINGS: The heart size and mediastinal contours are within normal limits. Both lungs are clear. The visualized skeletal structures are unremarkable. IMPRESSION: No active cardiopulmonary disease. Electronically Signed   By: Alcide Clever M.D.   On: 11/04/2016 21:13    EKG:  - Ordered  Assessment/Plan Active Problems:   * No active hospital problems. *  #1 Systemic Inflammatory Response Syndrome: Patient presents with flulike symptoms/viral syndrome. Blood and urine culture-follow-up Antibiotic coverage for possible evolving sepsis.  #2 HIV/AIDS: Continue antiretroviral medications Continue PCP prophylaxis  #3 Dehydration: IV fluid rehydration  #4 Anemia: Suspect anemia chronic disease related to #2. Follow clinically   DVT prophylaxis: (Lovenox) Code Status: (Full) Family Communication: Disposition Plan: (Home) Consults called:  Admission status:  ( obs / tele)   OSEI-BONSU,Nastassia Bazaldua MD [facility] Pager [phone]  If 7PM-7AM, please contact night-coverage [email or web address] Password [email or web address]  11/05/2016, 1:40 AM

## 2016-11-05 NOTE — Progress Notes (Signed)
PROGRESS NOTE                                                                                                                                                                                                             Patient Demographics:    [name] [name], is a 21 y.o. male, DOB - 1996/06/08, ZOX:[id number]  Admit date - 11/04/2016   Admitting Physician Jackie Plum, MD  Outpatient Primary MD for the patient is No PCP Per Patient  LOS - 0  Chief Complaint  Patient presents with  . Generalized Body Aches       Brief Narrative [name] [name] is a 21 y.o. male with medical history significant for seizure disorder, HIV -  AIDS, was started on antiretroviral medications a few weeks ago, currently on Bactrim for prophylaxis presented to the hospital with generalized weakness body aches and high fevers, no other symptoms except ongoing diarrhea for the past few days. He was admitted with sepsis.   Subjective:    [name] [name] today has, No headache, No chest pain, No abdominal pain - No Nausea, No new weakness tingling or numbness, No Cough - SOB. +ve diarrhea    Assessment  & Plan :     1. Sepsis in a patient with AIDS recently started on antiretroviral medications and now has diarrhea - obvious stool studies including C. difficile, cultures, GI panel, cryptosporidium, AFB in stool and Giardia, follow blood and urine cultures, chest x-ray and UA unremarkable, patient has nontoxic appearance no headache or any other symptoms except diarrhea. Sepsis physiology has resolved he appears nontoxic.  This could be GI infection or immune reconstitution syndrome. Case discussed with ID physician on call Dr. Orvan Falconer on 11/05/2016. For now IV fluids and supportive care and monitor results. Hold antibiotics. Home antiretrovirals and Bactrim prophylactically as before. If remains Stable follow with Dr. Ninetta Lights in one week.  2. HIV AIDS.  Recent CD4 counts and viral load below, continue home antiretrovirals and Bactrim as before.  3. Dehydration and hypotension. Responding well to IV fluids.  4. Right buttock chronic crusted skin lesion. No signs of active infection outpatient dermatology follow-up recommended.    Diet : Diet regular Room service appropriate? Yes; Fluid consistency: Thin    Family Communication  :  None  Code Status :  Full  Disposition Plan  :  Home 1-2 days  Consults  :  ID over the phone Dr Orvan Falconer  Procedures  :  None  DVT Prophylaxis  :  Lovenox    Lab Results  Component Value Date   PLT 216 11/05/2016    Inpatient Medications  Scheduled Meds: . darunavir-cobicistat  1 tablet Oral Q breakfast  . emtricitabine-tenofovir AF  1 tablet Oral Daily  . enoxaparin (LOVENOX) injection  40 mg Subcutaneous Q24H  . levETIRAcetam  500 mg Oral BID  . sulfamethoxazole-trimethoprim  1 tablet Oral Daily  . valACYclovir  1,000 mg Oral BID   Continuous Infusions: . sodium chloride 150 mL/hr at 11/05/16 0350   PRN Meds:.HYDROcodone-acetaminophen, hydrocortisone cream, [DISCONTINUED] ondansetron **OR** ondansetron (ZOFRAN) IV, traZODone  Antibiotics  :    Anti-infectives    Start     Dose/Rate Route Frequency Ordered Stop   11/05/16 1000  sulfamethoxazole-trimethoprim (BACTRIM DS,SEPTRA DS) 800-160 MG per tablet 1 tablet     1 tablet Oral Daily 11/05/16 0311     11/05/16 1000  valACYclovir (VALTREX) tablet 1,000 mg     1,000 mg Oral 2 times daily 11/05/16 0311     11/05/16 1000  emtricitabine-tenofovir AF (DESCOVY) 200-25 MG per tablet 1 tablet     1 tablet Oral Daily 11/05/16 0311     11/05/16 0800  darunavir-cobicistat (PREZCOBIX) 800-150 MG per tablet 1 tablet    Comments:  Swallow whole. Do NOT crush, break or chew tablets. Take with food.     1 tablet Oral Daily with breakfast 11/05/16 0311     11/05/16 0630  vancomycin (VANCOCIN) IVPB 1000 mg/200 mL premix  Status:  Discontinued      1,000 mg 200 mL/hr over 60 Minutes Intravenous Every 8 hours 11/04/16 2226 11/05/16 1009   11/05/16 0430  piperacillin-tazobactam (ZOSYN) IVPB 3.375 g  Status:  Discontinued     3.375 g 12.5 mL/hr over 240 Minutes Intravenous Every 8 hours 11/04/16 2226 11/05/16 1009   11/05/16 0315  piperacillin-tazobactam (ZOSYN) IVPB 3.375 g  Status:  Discontinued     3.375 g 100 mL/hr over 30 Minutes Intravenous  Once 11/05/16 0311 11/05/16 0312   11/05/16 0315  vancomycin (VANCOCIN) IVPB 1000 mg/200 mL premix  Status:  Discontinued     1,000 mg 200 mL/hr over 60 Minutes Intravenous  Once 11/05/16 0311 11/05/16 0312   11/04/16 2200  vancomycin (VANCOCIN) 1,500 mg in sodium chloride 0.9 % 500 mL IVPB     1,500 mg 250 mL/hr over 120 Minutes Intravenous  Once 11/04/16 2153 11/05/16 0224   11/04/16 2145  piperacillin-tazobactam (ZOSYN) IVPB 3.375 g     3.375 g 100 mL/hr over 30 Minutes Intravenous  Once 11/04/16 2143 11/04/16 2312   11/04/16 2145  vancomycin (VANCOCIN) IVPB 1000 mg/200 mL premix  Status:  Discontinued     1,000 mg 200 mL/hr over 60 Minutes Intravenous  Once 11/04/16 2143 11/04/16 2151         Objective:   Vitals:   11/05/16 0224 11/05/16 0230 11/05/16 0245 11/05/16 0331  BP:  113/55 (!) 106/54 (!) 105/56  Pulse:  114 109 (!) 110  Resp:  18 14 19   Temp: 102.1 F (38.9 C)   99.8 F (37.7 C)  TempSrc: Oral   Oral  SpO2:  100% 100% 100%  Weight:    85 kg (187 lb 8 oz)  Height:    6\' 7"  (2.007 m)    Wt Readings from Last 3 Encounters:  11/05/16 85 kg (187  lb 8 oz)  10/27/16 85.1 kg (187 lb 9 oz)  09/14/16 78.9 kg (174 lb)     Intake/Output Summary (Last 24 hours) at 11/05/16 1019 Last data filed at 11/05/16 0854  Gross per 24 hour  Intake             3875 ml  Output              450 ml  Net             3425 ml     Physical Exam  Awake Alert, Oriented X 3, No new F.N deficits, Normal affect [place].AT,PERRAL Supple Neck,No JVD, No cervical lymphadenopathy appriciated.   Symmetrical Chest wall movement, Good air movement bilaterally, CTAB RRR,No Gallops,Rubs or new Murmurs, No Parasternal Heave +ve B.Sounds, Abd Soft, No tenderness, No organomegaly appriciated, No rebound - guarding or rigidity. No Cyanosis, Clubbing or edema, No new Rash or bruise, Right buttock on the interior aspect there is a old crusted 1 cm in diameter skin lesion with no surrounding cellulitis or fluctuance.    Data Review:    Results for MONTELL, LEOPARD (MRN [id number]) as of 11/05/2016 10:11  Ref. Range 08/30/2016 04:10 08/30/2016 11:26 08/31/2016 15:17 11/01/2016 16:01  HIV 1 RNA Quant Latest Ref Range: NOT DETECTED copies/mL    152 (H)  HIV1 RNA Quant, Log Latest Ref Range: NOT DETECTED Log copies/mL 5.889   2.18 (H)  HIV-1 RNA by PCR Latest Units: copies/mL 774,000     Methodology Unknown Comment       Results for ARYE, WEYENBERG (MRN [id number]) as of 11/05/2016 10:11  Ref. Range 08/30/2016 04:10 08/30/2016 11:26 08/31/2016 15:17 11/01/2016 16:01  HIV 1 RNA Quant Latest Ref Range: NOT DETECTED copies/mL    152 (H)  HIV1 RNA Quant, Log Latest Ref Range: NOT DETECTED Log copies/mL 5.889   2.18 (H)  HIV-1 RNA by PCR Latest Units: copies/mL 774,000     Methodology Unknown Comment       CBC  Recent Labs Lab 11/01/16 1601 11/04/16 2036 11/05/16 0624  WBC 5.3 6.7 5.0  HGB 12.5* 12.7* 11.1*  HCT 37.5* 37.5* 33.2*  PLT 270 243 216  MCV 94.5 93.5 93.0  MCH 31.5 31.7 31.1  MCHC 33.3 33.9 33.4  RDW 15.4* 14.4 14.2  LYMPHSABS  --   --  0.6*  MONOABS  --   --  0.1  EOSABS  --   --  0.3  BASOSABS  --   --  0.0    Chemistries   Recent Labs Lab 11/01/16 1601 11/04/16 2036 11/05/16 0624  NA 138 137 134*  K 4.0 3.7 3.7  CL 103 99* 101  CO2 28 26 22   GLUCOSE 76 78 101*  BUN 14 12 9   CREATININE 1.00 1.08 1.17  CALCIUM 9.9 9.6 8.2*  AST 16 21 18   ALT 10 14* 12*  ALKPHOS 55 58 50  BILITOT 0.3 0.6 0.6    ------------------------------------------------------------------------------------------------------------------ No results for input(s): CHOL, HDL, LDLCALC, TRIG, CHOLHDL, LDLDIRECT in the last 72 hours.  No results found for: HGBA1C ------------------------------------------------------------------------------------------------------------------ No results for input(s): TSH, T4TOTAL, T3FREE, THYROIDAB in the last 72 hours.  Invalid input(s): FREET3 ------------------------------------------------------------------------------------------------------------------ No results for input(s): VITAMINB12, FOLATE, FERRITIN, TIBC, IRON, RETICCTPCT in the last 72 hours.  Coagulation profile  Recent Labs Lab 11/05/16 0624  INR 1.22    No results for input(s): DDIMER in the last 72 hours.  Cardiac Enzymes  Recent Labs Lab 11/05/16 0624  TROPONINI <0.03   ------------------------------------------------------------------------------------------------------------------  No results found for: BNP  Micro Results Recent Results (from the past 240 hour(s))  Culture, blood (x 2)     Status: None (Preliminary result)   Collection Time: 11/05/16  6:30 AM  Result Value Ref Range Status   Specimen Description BLOOD LEFT ANTECUBITAL  Final   Special Requests IN PEDIATRIC BOTTLE 1.5CC  Final   Culture PENDING  Incomplete   Report Status PENDING  Incomplete  Culture, blood (x 2)     Status: None (Preliminary result)   Collection Time: 11/05/16  6:35 AM  Result Value Ref Range Status   Specimen Description BLOOD LEFT ANTECUBITAL  Final   Special Requests IN PEDIATRIC BOTTLE 1.5CC  Final   Culture PENDING  Incomplete   Report Status PENDING  Incomplete    Radiology Reports Dg Chest 2 View  Result Date: 11/04/2016 CLINICAL DATA:  Chest pain EXAM: CHEST  2 VIEW COMPARISON:  08/29/2016 FINDINGS: The heart size and mediastinal contours are within normal limits. Both lungs are clear. The  visualized skeletal structures are unremarkable. IMPRESSION: No active cardiopulmonary disease. Electronically Signed   By: Alcide CleverMark  Lukens M.D.   On: 11/04/2016 21:13    Time Spent in minutes  30   SINGH,PRASHANT K M.D on 11/05/2016 at 10:19 AM  Between 7am to 7pm - Pager - [phone]  After 7pm go to [email or web address] - password [email or web address]  [facility] -  Office  [phone]

## 2016-11-05 NOTE — Progress Notes (Signed)
Spoke with [name], IP on call.  Patient does not have to be on droplet for adenovirus nor coronavirus.

## 2016-11-06 LAB — URINE CULTURE: Culture: NO GROWTH

## 2016-11-06 LAB — CBC
HCT: 29.6 % — ABNORMAL LOW (ref 39.0–52.0)
Hemoglobin: 10.1 g/dL — ABNORMAL LOW (ref 13.0–17.0)
MCH: 31.7 pg (ref 26.0–34.0)
MCHC: 34.1 g/dL (ref 30.0–36.0)
MCV: 92.8 fL (ref 78.0–100.0)
Platelets: 185 10*3/uL (ref 150–400)
RBC: 3.19 MIL/uL — ABNORMAL LOW (ref 4.22–5.81)
RDW: 14 % (ref 11.5–15.5)
WBC: 4.6 10*3/uL (ref 4.0–10.5)

## 2016-11-06 LAB — GLUCOSE, CAPILLARY: Glucose-Capillary: 80 mg/dL (ref 65–99)

## 2016-11-06 MED ORDER — POTASSIUM CHLORIDE CRYS ER 20 MEQ PO TBCR
40.0000 meq | EXTENDED_RELEASE_TABLET | Freq: Once | ORAL | Status: AC
  Administered 2016-11-06: 40 meq via ORAL
  Filled 2016-11-06: qty 2

## 2016-11-06 NOTE — Discharge Summary (Signed)
[name] [name] ZOX:[id number] DOB: Apr 20, 1996 DOA: 11/04/2016  PCP: No PCP Per Patient  Admit date: 11/04/2016  Discharge date: 11/06/2016  Admitted From: Home   Disposition:  Home   Recommendations for Outpatient Follow-up:   Follow up with PCP in 1-2 weeks  PCP Please obtain BMP/CBC, 2 view CXR in 1week,  (see Discharge instructions)   PCP Please follow up on the following pending results: Note following stool studies were ordered and pending Stool cultures, GI panel, cryptosporidium, AFB in stool and Giardia, follow final blood and urine cultures.   Home Health: None   Equipment/Devices: None  Consultations: ID over the phone Discharge Condition: Stable   CODE STATUS: Full   Diet Recommendation:  Heart Healthy    Chief Complaint  Patient presents with  . Generalized Body Aches     Brief history of present illness from the day of admission and additional interim summary    [name] [name] a 20 y.o.malewith medical history significant for seizure disorder, HIV -  AIDS, was started on antiretroviral medications a few weeks ago, currently on Bactrim for prophylaxis presented to the hospital with generalized weakness body aches and high fevers, no other symptoms except ongoing diarrhea for the past few days. He was admitted with sepsis.                                                                 Hospital Course    1. ? Sepsis in a patient with AIDS recently started on antiretroviral medications who had a mild cough and diarrhea-   Respiratory valve panel positive for adenovirus and coronavirus, C. difficile antigen positive, he was treated supportively with IV fluids, his lactate was only borderline, he never had toxic appearance hence clinically I do not think he was septic. He was treated with IV fluids, he  has had no loose stools in the last 24 hours which clinically rules out C. difficile. He likely had mild viral URI along with possibly immune reconstitution syndrome due to his recent compliance with antiretroviral medications.  He is completely symptom free this morning, no diarrhea, no cough or shortness of breath, no fevers, will be discharged on his home medications with outpatient ID and PCP follow-up. Note following stool studies were ordered and pending Stool cultures, GI panel, cryptosporidium, AFB in stool and Giardia, follow blood and urine cultures.  This could be GI infection or immune reconstitution syndrome. Case discussed with ID physician on call Dr. Orvan Falconer on 11/05/2016. For now IV fluids and supportive care and monitor results. Hold antibiotics. Home antiretrovirals and Bactrim prophylactically as before. If remains Stable follow with Dr. Ninetta Lights in one week.  2. HIV AIDS. Recent CD4 counts and viral load below, continue home antiretrovirals and Bactrim as before.  3. Dehydration and hypotension. Responding well to  IV fluids.  4. Right buttock chronic crusted skin lesion. No signs of active infection outpatient dermatology follow-up recommended.   Discharge diagnosis     Principal Problem:   Hypotension Active Problems:   AIDS (HCC)   SIRS (systemic inflammatory response syndrome) (HCC)    Discharge instructions    Discharge Instructions    Diet - low sodium heart healthy    Complete by:  As directed    Discharge instructions    Complete by:  As directed    Follow with Primary MD and Dr Ninetta Lights in 7 days   Get CBC, CMP, 2 view Chest X ray checked  by Primary MD or SNF MD in 5-7 days ( we routinely change or add medications that can affect your baseline labs and fluid status, therefore we recommend that you get the mentioned basic workup next visit with your PCP, your PCP may decide not to get them or add new tests based on their clinical decision)  Activity:  As tolerated with Full fall precautions use walker/cane & assistance as needed  Disposition Home   Diet:   Heart Healthy with feeding assistance and aspiration precautions.  For Heart failure patients - Check your Weight same time everyday, if you gain over 2 pounds, or you develop in leg swelling, experience more shortness of breath or chest pain, call your Primary MD immediately. Follow Cardiac Low Salt Diet and 1.5 lit/day fluid restriction.  On your next visit with your primary care physician please Get Medicines reviewed and adjusted.  Please request your Prim.MD to go over all Hospital Tests and Procedure/Radiological results at the follow up, please get all Hospital records sent to your Prim MD by signing hospital release before you go home.  If you experience worsening of your admission symptoms, develop shortness of breath, life threatening emergency, suicidal or homicidal thoughts you must seek medical attention immediately by calling 911 or calling your MD immediately  if symptoms less severe.  You Must read complete instructions/literature along with all the possible adverse reactions/side effects for all the Medicines you take and that have been prescribed to you. Take any new Medicines after you have completely understood and accpet all the possible adverse reactions/side effects.   Do not drive, operate heavy machinery, perform activities at heights, swimming or participation in water activities or provide baby sitting services if your were admitted for syncope or siezures until you have seen by Primary MD or a Neurologist and advised to do so again.  Do not drive when taking Pain medications.    Do not take more than prescribed Pain, Sleep and Anxiety Medications  Special Instructions: If you have smoked or chewed Tobacco  in the last 2 yrs please stop smoking, stop any regular Alcohol  and or any Recreational drug use.  Wear Seat belts while driving.   Please note  You  were cared for by a hospitalist during your hospital stay. If you have any questions about your discharge medications or the care you received while you were in the hospital after you are discharged, you can call the unit and asked to speak with the hospitalist on call if the hospitalist that took care of you is not available. Once you are discharged, your primary care physician will handle any further medical issues. Please note that NO REFILLS for any discharge medications will be authorized once you are discharged, as it is imperative that you return to your primary care physician (or establish a relationship with a  primary care physician if you do not have one) for your aftercare needs so that they can reassess your need for medications and monitor your lab values.   Increase activity slowly    Complete by:  As directed       Discharge Medications   Allergies as of 11/06/2016   No Known Allergies     Medication List    TAKE these medications   darunavir-cobicistat 800-150 MG tablet Commonly known as:  PREZCOBIX Take 1 tablet by mouth daily with breakfast. Swallow whole. Do NOT crush, break or chew tablets. Take with food.   emtricitabine-tenofovir AF 200-25 MG tablet Commonly known as:  DESCOVY Take 1 tablet by mouth daily.   hydrocortisone cream 1 % Apply topically 3 (three) times daily as needed for itching.   levETIRAcetam 500 MG tablet Commonly known as:  KEPPRA Take 1 tablet (500 mg total) by mouth 2 (two) times daily.   sulfamethoxazole-trimethoprim 800-160 MG tablet Commonly known as:  BACTRIM DS,SEPTRA DS Take 1 tablet by mouth daily.   valACYclovir 1000 MG tablet Commonly known as:  VALTREX Take 1 tablet (1,000 mg total) by mouth 2 (two) times daily.       Follow-up Information    Johny SaxJeffrey Hatcher, MD. Schedule an appointment as soon as possible for a visit in 1 week(s).   Specialty:  Infectious Diseases Why:  and your PCP in 1 week Contact information: [address] [place] [address] [phone]           Major procedures and Radiology Reports - PLEASE review detailed and final reports thoroughly  -        Dg Chest 2 View  Result Date: 11/04/2016 CLINICAL DATA:  Chest pain EXAM: CHEST  2 VIEW COMPARISON:  08/29/2016 FINDINGS: The heart size and mediastinal contours are within normal limits. Both lungs are clear. The visualized skeletal structures are unremarkable. IMPRESSION: No active cardiopulmonary disease. Electronically Signed   By: Alcide CleverMark  Lukens M.D.   On: 11/04/2016 21:13    Micro Results     Recent Results (from the past 240 hour(s))  Urine culture     Status: None   Collection Time: 11/04/16  8:31 PM  Result Value Ref Range Status   Specimen Description URINE, RANDOM  Final   Special Requests NONE  Final   Culture NO GROWTH  Final   Report Status 11/06/2016 FINAL  Final  Respiratory Panel by PCR     Status: None   Collection Time: 11/04/16  9:42 PM  Result Value Ref Range Status   Adenovirus NOT DETECTED NOT DETECTED Final   Coronavirus 229E NOT DETECTED NOT DETECTED Final   Coronavirus HKU1 NOT DETECTED NOT DETECTED Final   Coronavirus NL63 NOT DETECTED NOT DETECTED Final   Coronavirus OC43 NOT DETECTED NOT DETECTED Final   Metapneumovirus NOT DETECTED NOT DETECTED Final   Rhinovirus / Enterovirus NOT DETECTED NOT DETECTED Final   Influenza A NOT DETECTED NOT DETECTED Final   Influenza B NOT DETECTED NOT DETECTED Final   Parainfluenza Virus 1 NOT DETECTED NOT DETECTED Final   Parainfluenza Virus 2 NOT DETECTED NOT DETECTED Final   Parainfluenza Virus 3 NOT DETECTED NOT DETECTED Final   Parainfluenza Virus 4 NOT DETECTED NOT DETECTED Final   Respiratory Syncytial Virus NOT DETECTED NOT DETECTED Final   Bordetella pertussis NOT DETECTED NOT DETECTED Final   Chlamydophila pneumoniae NOT DETECTED NOT DETECTED Final   Mycoplasma pneumoniae NOT DETECTED NOT DETECTED Final  Culture, blood (x 2)      Status: None (Preliminary result)   Collection Time: 11/05/16  6:30 AM  Result Value Ref Range Status   Specimen Description BLOOD LEFT ANTECUBITAL  Final   Special Requests IN PEDIATRIC BOTTLE 1.5CC  Final   Culture PENDING  Incomplete   Report Status PENDING  Incomplete  Culture, blood (x 2)     Status: None (Preliminary result)   Collection Time: 11/05/16  6:35 AM  Result Value Ref Range Status   Specimen Description BLOOD LEFT ANTECUBITAL  Final   Special Requests IN PEDIATRIC BOTTLE 1.5CC  Final   Culture PENDING  Incomplete   Report Status PENDING  Incomplete  C difficile quick scan w PCR reflex     Status: Abnormal   Collection Time: 11/05/16 10:37 AM  Result Value Ref Range Status   C Diff antigen POSITIVE (A) NEGATIVE Final   C Diff toxin NEGATIVE NEGATIVE Final   C Diff interpretation Results are indeterminate. See PCR results.  Final  Clostridium Difficile by PCR     Status: None   Collection Time: 11/05/16 10:37 AM  Result Value Ref Range Status   Toxigenic C Difficile by pcr NEGATIVE NEGATIVE Final    Comment: Patient is colonized with non toxigenic C. difficile. May not need treatment unless significant symptoms are present.  Respiratory Panel by PCR     Status: Abnormal   Collection Time: 11/05/16  3:28 PM  Result Value Ref Range Status   Adenovirus DETECTED (A) NOT DETECTED Final   Coronavirus 229E NOT DETECTED NOT DETECTED Final   Coronavirus HKU1 NOT DETECTED NOT DETECTED Final   Coronavirus NL63 DETECTED (A) NOT DETECTED Final   Coronavirus OC43 NOT DETECTED NOT DETECTED Final   Metapneumovirus NOT DETECTED NOT DETECTED Final   Rhinovirus / Enterovirus NOT DETECTED NOT DETECTED Final   Influenza A NOT DETECTED NOT DETECTED Final   Influenza B NOT DETECTED NOT DETECTED Final   Parainfluenza Virus 1 NOT DETECTED NOT DETECTED Final   Parainfluenza Virus 2 NOT DETECTED NOT DETECTED Final   Parainfluenza Virus 3 NOT DETECTED NOT DETECTED Final   Parainfluenza  Virus 4 NOT DETECTED NOT DETECTED Final   Respiratory Syncytial Virus NOT DETECTED NOT DETECTED Final   Bordetella pertussis NOT DETECTED NOT DETECTED Final   Chlamydophila pneumoniae NOT DETECTED NOT DETECTED Final   Mycoplasma pneumoniae NOT DETECTED NOT DETECTED Final    Today   Subjective    [name] [name] today has no headache,no chest abdominal pain,no new weakness tingling or numbness, feels much better wants to go home today.     Objective   Blood pressure 118/63, pulse 86, temperature 98.2 F (36.8 C), temperature source Oral, resp. rate 14, height 6\' 7"  (2.007 m), weight 86.2 kg (190 lb 0.6 oz), SpO2 99 %.   Intake/Output Summary (Last 24 hours) at 11/06/16 0936 Last data filed at 11/06/16 0810  Gross per 24 hour  Intake           3807.5 ml  Output             1750 ml  Net           2057.5 ml    Exam Awake Alert, Oriented x 3, No new F.N deficits, Normal affect [place].AT,PERRAL Supple Neck,No JVD, No cervical lymphadenopathy appriciated.  Symmetrical Chest wall movement, Good air movement bilaterally, CTAB RRR,No Gallops,Rubs or new Murmurs, No Parasternal Heave +ve B.Sounds, Abd Soft, Non tender, No organomegaly appriciated, No rebound -  guarding or rigidity. No Cyanosis, Clubbing or edema, No new Rash or bruise Right buttock on the interior aspect there is a old crusted 1 cm in diameter skin lesion with no surrounding cellulitis or fluctuance.   Data Review   CBC w Diff: Lab Results  Component Value Date   WBC 4.6 11/06/2016   HGB 10.1 (L) 11/06/2016   HCT 29.6 (L) 11/06/2016   PLT 185 11/06/2016   LYMPHOPCT 12 11/05/2016   MONOPCT 3 11/05/2016   EOSPCT 7 11/05/2016   BASOPCT 0 11/05/2016    CMP: Lab Results  Component Value Date   NA 137 11/05/2016   K 3.4 (L) 11/05/2016   CL 108 11/05/2016   CO2 24 11/05/2016   BUN 8 11/05/2016   CREATININE 1.06 11/05/2016   CREATININE 1.00 11/01/2016   PROT 7.5 11/05/2016   ALBUMIN 3.0 (L) 11/05/2016   BILITOT  0.6 11/05/2016   ALKPHOS 50 11/05/2016   AST 18 11/05/2016   ALT 11 (L) 11/05/2016  .   Total Time in preparing paper work, data evaluation and todays exam - 35 minutes  Leroy Sea M.D on 11/06/2016 at 9:36 AM  [facility]   Office  [phone]

## 2016-11-06 NOTE — Discharge Instructions (Signed)
Follow with Primary MD and Dr Ninetta LightsHatcher in 7 days   Get CBC, CMP, 2 view Chest X ray checked  by Primary MD or SNF MD in 5-7 days ( we routinely change or add medications that can affect your baseline labs and fluid status, therefore we recommend that you get the mentioned basic workup next visit with your PCP, your PCP may decide not to get them or add new tests based on their clinical decision)  Activity: As tolerated with Full fall precautions use walker/cane & assistance as needed  Disposition Home   Diet:   Heart Healthy with feeding assistance and aspiration precautions.  For Heart failure patients - Check your Weight same time everyday, if you gain over 2 pounds, or you develop in leg swelling, experience more shortness of breath or chest pain, call your Primary MD immediately. Follow Cardiac Low Salt Diet and 1.5 lit/day fluid restriction.  On your next visit with your primary care physician please Get Medicines reviewed and adjusted.  Please request your Prim.MD to go over all Hospital Tests and Procedure/Radiological results at the follow up, please get all Hospital records sent to your Prim MD by signing hospital release before you go home.  If you experience worsening of your admission symptoms, develop shortness of breath, life threatening emergency, suicidal or homicidal thoughts you must seek medical attention immediately by calling 911 or calling your MD immediately  if symptoms less severe.  You Must read complete instructions/literature along with all the possible adverse reactions/side effects for all the Medicines you take and that have been prescribed to you. Take any new Medicines after you have completely understood and accpet all the possible adverse reactions/side effects.   Do not drive, operate heavy machinery, perform activities at heights, swimming or participation in water activities or provide baby sitting services if your were admitted for syncope or siezures until  you have seen by Primary MD or a Neurologist and advised to do so again.  Do not drive when taking Pain medications.    Do not take more than prescribed Pain, Sleep and Anxiety Medications  Special Instructions: If you have smoked or chewed Tobacco  in the last 2 yrs please stop smoking, stop any regular Alcohol  and or any Recreational drug use.  Wear Seat belts while driving.   Please note  You were cared for by a hospitalist during your hospital stay. If you have any questions about your discharge medications or the care you received while you were in the hospital after you are discharged, you can call the unit and asked to speak with the hospitalist on call if the hospitalist that took care of you is not available. Once you are discharged, your primary care physician will handle any further medical issues. Please note that NO REFILLS for any discharge medications will be authorized once you are discharged, as it is imperative that you return to your primary care physician (or establish a relationship with a primary care physician if you do not have one) for your aftercare needs so that they can reassess your need for medications and monitor your lab values.

## 2016-11-06 NOTE — Clinical Social Work Note (Signed)
CSW notified pt is discharging and needs transportation home. Pt has no funds for bus/taxi. CSW provided bus pass for pt.   No further SW needs at this time.  Bayley Hurn B. Gean QuintBrown,MSW, LCSWA Clinical Social Work Dept Weekend Social Worker [phone] 2:33 PM

## 2016-11-06 NOTE — Progress Notes (Signed)
Discharge instructions and medications discussed with patient.  Home medications returned to patient from main pharmacy. All questions answered.  Social Work notified of patient's request for bus pass.

## 2016-11-07 ENCOUNTER — Telehealth: Payer: Self-pay | Admitting: *Deleted

## 2016-11-07 NOTE — Progress Notes (Signed)
Order received on 08/30/16 by Dr Ninetta LightsHatcher to [facility]  Patient was evaluated on 08/30/16 for [facility] during his hospitalization. Patient was consented to care on 08/31/16 after agreeing to services on 08/30/16  RN has made several attempt to reach the patient to include a drive to the patient's home without success. Medications were left at the home on 09/05/16 to ensure Clifton Custardaron has medications to treat his HIV  Frequency / Duration of [facility] visits: Effective: 10/28/16  83mo1, 672mo1, 841mo1   PRN's for complications with disease process/progression, medication changes or concerns   [facility] will assess for learning needs related to diagnosis and treatment regimen, provide education as needed, fill pill box if needed, address any barriers which may be preventing medication compliance, and communicating with care team including physician and case manager.   Individualized Plan Of Care Certification Period of 10/28/2016 to 01/26/17  a. Type of service(s) and care to be delivered: RN Case Management  b. Frequency and duration of service: Effective 10/28/16; 413mo1, 232mo1, 271mo1, 3 prns for complications with disease process/progression, medication changes or concerns . Visits/Contact may be conducted telephonically or in person to  best suit the  patient.  c. Activity restrictions: Pt may be up as tolerated and can safely ambulate without the need for an assistive device   d. Safety Measures: Standard Precautions/Infection Control  Last CD4 of <10 and viral load of 5768 (07/30/16)  e. Service Objectives and Goals: Service Objectives are to assist the pt with HIV medication regimen adherence and staying in care with the Infectious Disease Clinic by identifying barriers to care. RN will address the barriers that are identified by the  patient. Patient centered goal is to have his virus suppressed, find a job, get his own phone number and a place to stay.  Patient stated he also has goals to transition into a male before the age of 21. 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 Equipment required: No additional equipment needs at this time   g. Functional Limitations: No functional needs/limitations at this time  h. Rehabilitation potential: Guarded   i. Diet and Nutritional Needs: Regular Diet   j. Medications and treatments: Medications have been reconciled and reviewed and are a part of EPIC electronic file   k. Specific therapies if needed: RN   l. Pertinent diagnoses: HIV disease,  Hx of medication NonCompliance,  Perianal fistula  m. Expected outcome: Guarded

## 2016-11-07 NOTE — Telephone Encounter (Signed)
Order received on 08/30/16 by Dr Hatcher to evaluate patient for [facility] ([facility]  Patient was evaluated on 08/30/16 for [facility] during his hospitalization. Patient was consented to care on 08/31/16 after agreeing to services on 08/30/16  RN has made several attempt to reach the patient to include a drive to the patient's home without success. Medications were left at the home on 09/05/16 to ensure [place] has medications to treat his HIV  Frequency / Duration of [facility] visits: Effective: 10/28/16  3mo1, 2mo1, 1mo1   PRN's for complications with disease process/progression, medication changes or concerns   [facility] will assess for learning needs related to diagnosis and treatment regimen, provide education as needed, fill pill box if needed, address any barriers which may be preventing medication compliance, and communicating with care team including physician and case manager.   Individualized Plan Of Care Certification Period of 10/28/2016 to 01/26/17  a. Type of service(s) and care to be delivered: RN Case Management  b. Frequency and duration of service: Effective 10/28/16; 3mo1, 2mo1, 1mo1, 3 prns for complications with disease process/progression, medication changes or concerns . Visits/Contact may be conducted telephonically or in person to  best suit the  patient.  c. Activity restrictions: Pt may be up as tolerated and can safely ambulate without the need for an assistive device   d. Safety Measures: Standard Precautions/Infection Control  Last CD4 of <10 and viral load of 5768 (07/30/16)  e. Service Objectives and Goals: Service Objectives are to assist the pt with HIV medication regimen adherence and staying in care with the Infectious Disease Clinic by identifying barriers to care. RN will address the barriers that are identified by the  patient. Patient centered goal is to have his virus suppressed, find a job, get his own phone number and a place to stay.  Patient stated he also has goals to transition into a male before the age of 23. 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 Equipment required: No additional equipment needs at this time   g. Functional Limitations: No functional needs/limitations at this time  h. Rehabilitation potential: Guarded   i. Diet and Nutritional Needs: Regular Diet   j. Medications and treatments: Medications have been reconciled and reviewed and are a part of EPIC electronic file   k. Specific therapies if needed: RN   l. Pertinent diagnoses: HIV disease,  Hx of medication NonCompliance,  Perianal fistula  m. Expected outcome: Guarded      

## 2016-11-08 LAB — GASTROINTESTINAL PANEL BY PCR, STOOL (REPLACES STOOL CULTURE): Campylobacter species: UNDETERMINED — AB

## 2016-11-08 NOTE — Telephone Encounter (Signed)
I approve of the POC by Ms Uvaldo RisingMcNeil.

## 2016-11-09 LAB — CRYPTOSPORIDIUM/ISOSPORA SMEAR
Cryptosporidium Smear,Stool: NEGATIVE
Isospora Smear, Stool: NEGATIVE

## 2016-11-09 LAB — GIARDIA/CRYPTOSPORIDIUM EIA
Cryptosporidium EIA: NEGATIVE
Giardia Ag, Stl: NEGATIVE

## 2016-11-10 LAB — CULTURE, BLOOD (ROUTINE X 2)
Culture: NO GROWTH
Culture: NO GROWTH
Culture: NO GROWTH
Culture: NO GROWTH

## 2016-11-14 LAB — STOOL CULTURE REFLEX - CMPCXR

## 2016-11-14 LAB — STOOL CULTURE: E coli, Shiga toxin Assay: NEGATIVE

## 2016-11-14 LAB — STOOL CULTURE REFLEX - RSASHR

## 2016-11-15 ENCOUNTER — Encounter: Payer: Self-pay | Admitting: Infectious Diseases

## 2016-11-15 ENCOUNTER — Ambulatory Visit (INDEPENDENT_AMBULATORY_CARE_PROVIDER_SITE_OTHER): Payer: Self-pay | Admitting: Infectious Diseases

## 2016-11-15 VITALS — BP 145/94 | HR 182 | Temp 98.6°F | Ht 76.0 in | Wt 184.1 lb

## 2016-11-15 DIAGNOSIS — R569 Unspecified convulsions: Secondary | ICD-10-CM

## 2016-11-15 DIAGNOSIS — Z113 Encounter for screening for infections with a predominantly sexual mode of transmission: Secondary | ICD-10-CM

## 2016-11-15 DIAGNOSIS — K603 Anal fistula: Secondary | ICD-10-CM

## 2016-11-15 DIAGNOSIS — B20 Human immunodeficiency virus [HIV] disease: Secondary | ICD-10-CM

## 2016-11-15 DIAGNOSIS — R21 Rash and other nonspecific skin eruption: Secondary | ICD-10-CM

## 2016-11-15 MED ORDER — HYDROXYZINE HCL 10 MG PO TABS: 10.0000 mg | ORAL_TABLET | Freq: Three times a day (TID) | ORAL | 0 refills | Status: DC | PRN

## 2016-11-15 NOTE — Assessment & Plan Note (Signed)
He remains seizure free, continues on keppra.

## 2016-11-15 NOTE — Assessment & Plan Note (Addendum)
Will f/u with him about his [facility] f/u

## 2016-11-15 NOTE — Assessment & Plan Note (Signed)
He is doing well Will continue his current meds Discussed his improvement in his VL.  Will see him back in 3 months and hopefully stop bactrim then.  Given condoms.

## 2016-11-15 NOTE — Assessment & Plan Note (Signed)
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 Will give him hydroxyzine prn.

## 2016-11-15 NOTE — Progress Notes (Signed)
   Subjective:    Patient ID: [name] [name], male    DOB: 12/01/1995, 21 y.o.   MRN: [id number]  HPI 21 yo M with dx of HIV+ and syphilis in 2013. [facility] and started on complera. He was lost to f/u until 2017. [facility] and had CD4 10 (04-2016) and HIV RNA of 135,000.  He has been off his ART due to lapse in insurance.   He came to ED 12-4 with pain and pressure behind his eyes while at rest and then 1 minute of generalized seizure. He had a bland LP (Cx negative, JC-, CMV+ but < 200, EBV PCR-, cytology -) and was eval by neuro. The cause of his seizures was unclear, he was started on keppra and was able to be d/c home on 12-7.  He was started on prezcobix/descovy at that time.   Adm 2-9 to 2-11 with fevers, body aches. He had loose BM prior to adm but none in hospital. His labs were notable for +adenovirus, +coronavirus. He was also C diff ag+. He was not treated for C diff. 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    HIV 1 RNA Quant (copies/mL)  Date Value  11/01/2016 152 (H)   CD4 T Cell Abs (/uL)  Date Value  11/01/2016 110 (L)  08/29/2016 10 (L)  08/13/2016 <10 (L)   He has had worsening acne since on ART.  Has been feeling better since out of hospital.  No problems with ART.   Today notes tachycardia, htn.   Review of Systems  Constitutional: Negative for appetite change, chills, fever and unexpected weight change.  Respiratory: Negative for cough and shortness of breath.   Cardiovascular: Negative for chest pain.  Gastrointestinal: Positive for diarrhea. Negative for nausea.  Genitourinary: Negative for difficulty urinating.  Neurological: Negative for seizures and headaches.   HR repeated 76    Objective:   Physical Exam  Constitutional: He appears well-developed and well-nourished.  HENT:  Mouth/Throat: No oropharyngeal exudate.  Eyes: EOM are normal. Pupils are equal, round, and reactive to  light.  Neck: Neck supple.  Cardiovascular: Normal rate, regular rhythm and normal heart sounds.   Pulmonary/Chest: Effort normal and breath sounds normal.  Abdominal: Soft. Bowel sounds are normal. There is no tenderness. There is no rebound.  Musculoskeletal: He exhibits no edema.  Lymphadenopathy:    He has no cervical adenopathy.  Skin: Rash noted.      Assessment & Plan:

## 2017-01-26 ENCOUNTER — Telehealth: Payer: Self-pay | Admitting: *Deleted

## 2017-01-31 ENCOUNTER — Other Ambulatory Visit: Payer: Medicaid Other

## 2017-01-31 DIAGNOSIS — B20 Human immunodeficiency virus [HIV] disease: Secondary | ICD-10-CM

## 2017-02-01 LAB — T-HELPER CELL (CD4) - (RCID CLINIC ONLY)
CD4 % Helper T Cell: 11 % — ABNORMAL LOW (ref 33–55)
CD4 T Cell Abs: 150 /uL — ABNORMAL LOW (ref 400–2700)

## 2017-02-02 LAB — HIV-1 RNA QUANT-NO REFLEX-BLD
HIV 1 RNA Quant: <20 copies/mL — AB
HIV-1 RNA Quant, Log: <1.3 Log copies/mL — AB

## 2017-02-07 ENCOUNTER — Ambulatory Visit (INDEPENDENT_AMBULATORY_CARE_PROVIDER_SITE_OTHER): Payer: Medicaid Other | Admitting: Neurology

## 2017-02-07 ENCOUNTER — Encounter: Payer: Self-pay | Admitting: Neurology

## 2017-02-07 VITALS — BP 124/80 | HR 121 | Ht 78.0 in | Wt 177.0 lb

## 2017-02-07 DIAGNOSIS — R9089 Other abnormal findings on diagnostic imaging of central nervous system: Secondary | ICD-10-CM

## 2017-02-07 DIAGNOSIS — R569 Unspecified convulsions: Secondary | ICD-10-CM | POA: Diagnosis not present

## 2017-02-07 MED ORDER — LEVETIRACETAM 500 MG PO TABS: 500.0000 mg | ORAL_TABLET | Freq: Two times a day (BID) | ORAL | 3 refills | Status: DC

## 2017-02-07 NOTE — Progress Notes (Signed)
NEUROLOGY FOLLOW UP OFFICE NOTE  KELTON BULTMAN [id number] Apr 04, 1996  HISTORY OF PRESENT ILLNESS: I had the pleasure of seeing Cordera Stineman in follow-up in the neurology clinic on 02/07/2017.  The patient was last seen 3 months ago for new onset seizure last 08/29/2016 with witnessed convulsion. His brain MRI was abnormal, LP unremarkable, EEG normal. He was discharged home on Keppra 500mg  BID. A week after his initial visit, he started feeling the same fatigue he felt prior to the seizure and went to the ER, he was found to be febrile and hypotensive and diagnosed with SIRS. No seizures or seizure-like symptoms since December 2017. He denies any staring/unresponsive episodes, gaps in time, olfactory/gustatory hallucinations, deja vu, rising epigastric sensation, focal numbness/tingling/weakness, myoclonic jerks. He still feels his balance is off when walking long distances, he feel like he will fall or one of his knees will give out. No falls. He is tolerating Keppra 500mg  BID without side effects. Mood is good. He denies any headaches, dizziness, vision changes.  HPI 10/27/2016: This is a pleasant 21 yo RH man with a history of HIVwith new onset seizure last 08/29/2016. He recalls waking up feeling extra tired that day, lay on the couch, then woke up in the hospital. His father heard a thud and found him shaking on the floor. No tongue bite or incontinence. Records from [facility] were reviewed, he reported bitemporal pain behind his eyes a few days prior, taking Ibuprofen with some relief. He reported having the eye pain prior to falling asleep. Bloodowork was unremarkable. He had an MRI brain with and without contrast which I personally reviewed, there were T2/FLAIR changes in the perisylvian frontal lobes, insula and temporal lobes as well as increased signal within the caudate nuclei and putamen. In addition there is some abnormal signal in the subcortical white matter and medial temporal lobes and external  capsule. These could be seen with seizure and/or infectious, inflammatory, limbic encephalitis. Lumbar puncture was done, CSF WBC 1, RBC 0, protein elevated 141, glucose 48. CSF HSV, crypto, JCV, cultures, cytology were negative. His HIV RNA was 774,000. EBV negative, CMV positive. EEG was normal. He was discharged home on Keppra 500mg  BID and restarted on ART treatment.   He denies any prior history of seizures, and no further seizures since 08/29/16. He denies any  . Both feet have been constantly tingling since the seizure and that his balance has been off. He dances and feels his coordination is off. He denies any further headaches, dizziness, diplopia, dysarthria/dysphagia, neck/back pain, bowel dysfunction. He has some urinary urgency. Sleep is a little better now, he was getting intermittent sleep prior to the seizure. He denies any alcohol use. He reports a lot of stress around that time. He feels a little irritable on the Keppra but overall tolerating medication.   Epilepsy Risk Factors:  HIV with abnormal MRI brain. Otherwise he had a normal birth and early development.  There is no history of febrile convulsions, CNS infections such as meningitis/encephalitis, significant traumatic brain injury, neurosurgical procedures, or family history of seizures.  PAST MEDICAL HISTORY: Past Medical History:  Diagnosis Date  . Hepatitis B immune   . HIV disease (HCC)   . Immune deficiency disorder (HCC)   . 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 Syphilis     MEDICATIONS: Current Outpatient Prescriptions on File Prior to Visit  Medication Sig Dispense Refill  . darunavir-cobicistat (PREZCOBIX) 800-150 MG tablet Take 1  tablet by mouth daily with breakfast. Swallow whole. Do NOT crush, break or chew tablets. Take with food. 30 tablet 5  . emtricitabine-tenofovir AF (DESCOVY) 200-25 MG tablet Take 1 tablet by mouth daily. 30 tablet 5  . hydrocortisone cream 1 %  Apply topically 3 (three) times daily as needed for itching. (Patient not taking: Reported on 09/14/2016) 30 g 0  . hydrOXYzine (ATARAX/VISTARIL) 10 MG tablet Take 1 tablet (10 mg total) by mouth 3 (three) times daily as needed. 30 tablet 0  . levETIRAcetam (KEPPRA) 500 MG tablet Take 1 tablet (500 mg total) by mouth 2 (two) times daily. 60 tablet 11  . sulfamethoxazole-trimethoprim (BACTRIM DS,SEPTRA DS) 800-160 MG tablet Take 1 tablet by mouth daily. (Patient not taking: Reported on 09/14/2016) 90 tablet 2  . valACYclovir (VALTREX) 1000 MG tablet Take 1 tablet (1,000 mg total) by mouth 2 (two) times daily. 28 tablet 0   No current facility-administered medications on file prior to visit.     ALLERGIES: No Known Allergies  FAMILY HISTORY: No family history on file.  SOCIAL HISTORY: Social History   Social History  . Marital status: Single    Spouse name: N/A  . Number of children: N/A  . Years of education: N/A   Occupational History  . Not on file.   Social History Main Topics  . Smoking status: Current Every Day Smoker    Packs/day: 0.50    Types: Cigarettes  . Smokeless tobacco: Never Used     Comment: trying to cut back   . Alcohol use No  . Drug use: Yes    Types: Marijuana  . Sexual activity: No     Comment: given condom   Other Topics Concern  . Not on file   Social History Narrative  . No narrative on file    REVIEW OF SYSTEMS: Constitutional: No fevers, chills, or sweats, no generalized fatigue, change in appetite Eyes: No visual changes, double vision, eye pain Ear, nose and throat: No hearing loss, ear pain, nasal congestion, sore throat Cardiovascular: No chest pain, palpitations Respiratory:  No shortness of breath at rest or with exertion, wheezes GastrointestinaI: No nausea, vomiting, diarrhea, abdominal pain, fecal incontinence Genitourinary:  No dysuria, urinary retention or frequency Musculoskeletal:  No neck pain, back pain Integumentary: No  rash, pruritus, skin lesions Neurological: as above Psychiatric: No depression, insomnia, anxiety Endocrine: No palpitations, fatigue, diaphoresis, mood swings, change in appetite, change in weight, increased thirst Hematologic/Lymphatic:  No anemia, purpura, petechiae. Allergic/Immunologic: no itchy/runny eyes, nasal congestion, recent allergic reactions, rashes  PHYSICAL EXAM: Vitals:   02/07/17 1127  BP: 124/80  Pulse: (!) 121   General: No acute distress Head:  Normocephalic/atraumatic Neck: supple, no paraspinal tenderness, full range of motion Heart:  Regular rate and rhythm Lungs:  Clear to auscultation bilaterally Back: No paraspinal tenderness Skin/Extremities: No rash, no edema Neurological Exam: alert and oriented to person, place, and time. No aphasia or dysarthria. Fund of knowledge is appropriate.  Recent and remote memory are intact.  Attention and concentration are normal.    Able to name objects and repeat phrases. Cranial nerves: Pupils equal, round, reactive to light.  Extraocular movements intact with no nystagmus. Visual fields full. Facial sensation intact. No facial asymmetry. Tongue, uvula, palate midline.  Motor: Bulk and tone normal, muscle strength 5/5 throughout with no pronator drift.  Sensation to light touch intact.  No extinction to double simultaneous stimulation.  Deep tendon reflexes +2 throughout, toes downgoing.  Finger to  nose testing intact.  Gait narrow-based and steady, able to tandem walk adequately.  Romberg negative.  IMPRESSION: This is a pleasant 21 yo RH man with a history of HIV with new onset seizure on 08/29/16. MRI brain was abnormal with gray and white matter signal changes, which are non-specific and may be seen post-seizure, as well as with infectious/inflammatory conditions. Lumbar puncture was overall unrevealing, with note of elevated CSF protein. He was noted to have serum positive CMV and is back on ART medication. With abnormal MRI  brain, recommend continuation of Keppra 500mg  BID. A repeat MRI brain with and without contrast will be ordered to assess for interval change. He is aware of [place] driving laws to stop driving after a seizure, until 6 months seizure-free. He will follow-up in 6 months and knows to call for any changes.  Thank you for allowing me to participate in his care.  Please do not hesitate to call for any questions or concerns.  The duration of this appointment visit was 15 minutes of face-to-face time with the patient.  Greater than 50% of this time was spent in counseling, explanation of diagnosis, planning of further management, and coordination of care.   Patrcia Dolly, M.D.   CC: Dr. Ninetta Lights

## 2017-02-07 NOTE — Patient Instructions (Signed)
1. Schedule MRI brain with and without contrast 2. Continue Keppra 500mg  twice a day 3. Follow-up in 6 months, call for any changes  Seizure Precautions: 1. If medication has been prescribed for you to prevent seizures, take it exactly as directed.  Do not stop taking the medicine without talking to your doctor first, even if you have not had a seizure in a long time.   2. Avoid activities in which a seizure would cause danger to yourself or to others.  Don't operate dangerous machinery, swim alone, or climb in high or dangerous places, such as on ladders, roofs, or girders.  Do not drive unless your doctor says you may.  3. If you have any warning that you may have a seizure, lay down in a safe place where you can't hurt yourself.    4.  No driving for 6 months from last seizure, [place].   Please refer to the following link on the Epilepsy Foundation of America's website for more information: http://www.epilepsyfoundation.org/answerplace/Social/driving/drivingu.cfm   5.  Maintain good sleep hygiene. Avoid alcohol.  6.  Contact your doctor if you have any problems that may be related to the medicine you are taking.  7.  Call 911 and bring the patient back to the ED if:        A.  The seizure lasts longer than 5 minutes.       B.  The patient doesn't awaken shortly after the seizure  C.  The patient has new problems such as difficulty seeing, speaking or moving  D.  The patient was injured during the seizure  E.  The patient has a temperature over 102 F (39C)  F.  The patient vomited and now is having trouble breathing

## 2017-02-13 ENCOUNTER — Encounter: Payer: Self-pay | Admitting: Infectious Diseases

## 2017-02-13 ENCOUNTER — Ambulatory Visit (INDEPENDENT_AMBULATORY_CARE_PROVIDER_SITE_OTHER): Payer: Medicaid Other | Admitting: Infectious Diseases

## 2017-02-13 VITALS — BP 130/75 | HR 84 | Temp 98.4°F | Ht 78.0 in | Wt 175.0 lb

## 2017-02-13 DIAGNOSIS — B20 Human immunodeficiency virus [HIV] disease: Secondary | ICD-10-CM

## 2017-02-13 DIAGNOSIS — R569 Unspecified convulsions: Secondary | ICD-10-CM | POA: Diagnosis not present

## 2017-02-13 DIAGNOSIS — K603 Anal fistula: Secondary | ICD-10-CM | POA: Diagnosis not present

## 2017-02-13 NOTE — Assessment & Plan Note (Signed)
Continues on keppra.  Has f/u for this.

## 2017-02-13 NOTE — Assessment & Plan Note (Signed)
States this has resolved and only has skin tag now.  Will f/u prn.

## 2017-02-13 NOTE — Assessment & Plan Note (Signed)
He is doing well Will continue his current meds til [facility] DRVc is available.  Continue bactrim.  Given condoms Vac are up to date.  rtc in 6 months.

## 2017-02-13 NOTE — Progress Notes (Signed)
   Subjective:    Patient ID: [name] [name], male    DOB: Aug 06, 1996, 21 y.o.   MRN: [id number]  HPI 21 yo M with dx of HIV+ and syphilis in 2013. [facility] and [facility] on complera. He was lost to f/u until 2017. [facility] and had CD4 10 (04-2016) and HIV RNA of 135,000.  He has been off his ART due to lapse in insurance.   He came to ED 12-4with pain and pressure behind his eyes while at rest and then 1 minute of generalized seizure. He had a bland LP (Cx negative, JC-, CMV+ but <200, EBV PCR-, cytology -) and was eval by neuro. The cause of his seizures was unclear, he was started on keppra and was able to be d/c home on 12-7.  He was started on prezcobix/descovy at that time.   Adm 2-9 to 11-06-16 with fevers, body aches. He had loose BM prior to adm but none in hospital. His labs were notable for +adenovirus, +coronavirus. He was also C diff ag+.  He was last seen in ID on 2-20 and was doing well. He was continued on DRVc/Desc.  Has been feeling well.  Missed a few doses earlier in the month when moving. Marthann SchillerMitch has been helping. Has been working on apt.   HIV 1 RNA Quant (copies/mL)  Date Value  01/31/2017 <20 DETECTED (A)  11/01/2016 152 (H)   CD4 T Cell Abs (/uL)  Date Value  01/31/2017 150 (L)  11/01/2016 110 (L)  08/29/2016 10 (L)    Review of Systems  Constitutional: Negative for appetite change, chills, fever and unexpected weight change.  Respiratory: Negative for cough and shortness of breath.   Gastrointestinal: Negative for constipation and diarrhea.  Genitourinary: Negative for difficulty urinating and genital sores.  Neurological: Negative for headaches.  Psychiatric/Behavioral: Negative for dysphoric mood.      Objective:   Physical Exam  Constitutional: He appears well-developed and well-nourished.  HENT:  Mouth/Throat: No oropharyngeal exudate.  Eyes: EOM are normal. Pupils are equal, round, and reactive to light.    Neck: Neck supple.  Cardiovascular: Normal rate, regular rhythm and normal heart sounds.   Pulmonary/Chest: Effort normal and breath sounds normal.  Abdominal: Soft. Bowel sounds are normal. There is no tenderness. There is no rebound.  Musculoskeletal: He exhibits no edema.  Lymphadenopathy:    He has no cervical adenopathy.  Psychiatric: He has a normal mood and affect.          Assessment & Plan:

## 2017-04-03 ENCOUNTER — Other Ambulatory Visit: Payer: Self-pay | Admitting: Internal Medicine

## 2017-04-03 DIAGNOSIS — B20 Human immunodeficiency virus [HIV] disease: Secondary | ICD-10-CM

## 2017-04-04 ENCOUNTER — Telehealth: Payer: Self-pay

## 2017-04-04 DIAGNOSIS — B20 Human immunodeficiency virus [HIV] disease: Secondary | ICD-10-CM

## 2017-04-04 MED ORDER — EMTRICITABINE-TENOFOVIR AF 200-25 MG PO TABS: 1.0000 | ORAL_TABLET | Freq: Every day | ORAL | 5 refills | Status: DC

## 2017-04-05 MED ORDER — EMTRICITABINE-TENOFOVIR AF 200-25 MG PO TABS: 1.0000 | ORAL_TABLET | Freq: Every day | ORAL | 5 refills | Status: DC

## 2017-04-14 ENCOUNTER — Other Ambulatory Visit: Payer: Medicaid Other

## 2017-04-21 NOTE — Telephone Encounter (Signed)
The intent of this communication is to inform the Health Care Team that this patient will be [facility]  Greater than 3 attempts have been made to re-engage the patient  without any success .Moving forward, the [facility] will be willing to reopen the patient to services if and when the patient is ready to discuss medication adherence and HIV disease  management. Effective 01/26/17 patient will be discharged and removed from [facility].   Patient has maintained viral load suppression at this time and kept medical appts with providers

## 2017-04-25 NOTE — Telephone Encounter (Signed)
No note in chart 

## 2017-04-26 DIAGNOSIS — A072 Cryptosporidiosis: Secondary | ICD-10-CM

## 2017-04-26 DIAGNOSIS — N179 Acute kidney failure, unspecified: Secondary | ICD-10-CM

## 2017-04-26 HISTORY — DX: Acute kidney failure, unspecified: N17.9

## 2017-04-26 HISTORY — DX: Cryptosporidiosis: A07.2

## 2017-05-24 ENCOUNTER — Encounter (HOSPITAL_COMMUNITY): Payer: Self-pay | Admitting: Nurse Practitioner

## 2017-05-24 ENCOUNTER — Inpatient Hospital Stay (HOSPITAL_COMMUNITY)
Admission: EM | Admit: 2017-05-24 | Discharge: 2017-05-26 | DRG: 682 | Disposition: A | Discharge status: Home / Self Care | Payer: Medicaid Other | Attending: Student in an Organized Health Care Education/Training Program | Admitting: Student in an Organized Health Care Education/Training Program

## 2017-05-24 ENCOUNTER — Emergency Department (HOSPITAL_COMMUNITY): Payer: Medicaid Other

## 2017-05-24 DIAGNOSIS — R319 Hematuria, unspecified: Secondary | ICD-10-CM | POA: Diagnosis present

## 2017-05-24 DIAGNOSIS — Z79899 Other long term (current) drug therapy: Secondary | ICD-10-CM | POA: Diagnosis not present

## 2017-05-24 DIAGNOSIS — R000 Tachycardia, unspecified: Secondary | ICD-10-CM | POA: Diagnosis present

## 2017-05-24 DIAGNOSIS — B20 Human immunodeficiency virus [HIV] disease: Secondary | ICD-10-CM | POA: Diagnosis present

## 2017-05-24 DIAGNOSIS — R112 Nausea with vomiting, unspecified: Secondary | ICD-10-CM | POA: Diagnosis not present

## 2017-05-24 DIAGNOSIS — K529 Noninfective gastroenteritis and colitis, unspecified: Secondary | ICD-10-CM

## 2017-05-24 DIAGNOSIS — F17210 Nicotine dependence, cigarettes, uncomplicated: Secondary | ICD-10-CM | POA: Diagnosis present

## 2017-05-24 DIAGNOSIS — I959 Hypotension, unspecified: Secondary | ICD-10-CM | POA: Diagnosis present

## 2017-05-24 DIAGNOSIS — A040 Enteropathogenic Escherichia coli infection: Secondary | ICD-10-CM | POA: Diagnosis present

## 2017-05-24 DIAGNOSIS — G40909 Epilepsy, unspecified, not intractable, without status epilepticus: Secondary | ICD-10-CM | POA: Diagnosis present

## 2017-05-24 DIAGNOSIS — R197 Diarrhea, unspecified: Secondary | ICD-10-CM

## 2017-05-24 DIAGNOSIS — E860 Dehydration: Secondary | ICD-10-CM | POA: Diagnosis present

## 2017-05-24 DIAGNOSIS — B9620 Unspecified Escherichia coli [E. coli] as the cause of diseases classified elsewhere: Secondary | ICD-10-CM | POA: Diagnosis present

## 2017-05-24 DIAGNOSIS — N179 Acute kidney failure, unspecified: Principal | ICD-10-CM | POA: Diagnosis present

## 2017-05-24 DIAGNOSIS — T375X6A Underdosing of antiviral drugs, initial encounter: Secondary | ICD-10-CM | POA: Diagnosis present

## 2017-05-24 DIAGNOSIS — E861 Hypovolemia: Secondary | ICD-10-CM | POA: Diagnosis present

## 2017-05-24 DIAGNOSIS — A072 Cryptosporidiosis: Secondary | ICD-10-CM | POA: Diagnosis present

## 2017-05-24 DIAGNOSIS — Z21 Asymptomatic human immunodeficiency virus [HIV] infection status: Secondary | ICD-10-CM | POA: Diagnosis not present

## 2017-05-24 DIAGNOSIS — R252 Cramp and spasm: Secondary | ICD-10-CM | POA: Diagnosis present

## 2017-05-24 DIAGNOSIS — R569 Unspecified convulsions: Secondary | ICD-10-CM

## 2017-05-24 DIAGNOSIS — Z91128 Patient's intentional underdosing of medication regimen for other reason: Secondary | ICD-10-CM | POA: Diagnosis not present

## 2017-05-24 DIAGNOSIS — K6289 Other specified diseases of anus and rectum: Secondary | ICD-10-CM

## 2017-05-24 HISTORY — DX: Unspecified convulsions: R56.9

## 2017-05-24 LAB — DIFFERENTIAL
Basophils Absolute: 0 10*3/uL (ref 0.0–0.1)
Basophils Relative: 0 %
Eosinophils Absolute: 0 10*3/uL (ref 0.0–0.7)
Eosinophils Relative: 0 %
Lymphocytes Relative: 21 %
Lymphs Abs: 1.6 10*3/uL (ref 0.7–4.0)
Monocytes Absolute: 0.7 10*3/uL (ref 0.1–1.0)
Monocytes Relative: 9 %
Neutro Abs: 5.2 10*3/uL (ref 1.7–7.7)
Neutrophils Relative %: 70 %

## 2017-05-24 LAB — COMPREHENSIVE METABOLIC PANEL
ALT: 18 U/L (ref 17–63)
AST: 35 U/L (ref 15–41)
Albumin: 4.6 g/dL (ref 3.5–5.0)
Alkaline Phosphatase: 81 U/L (ref 38–126)
Anion gap: 16 — ABNORMAL HIGH (ref 5–15)
BUN: 36 mg/dL — ABNORMAL HIGH (ref 6–20)
CO2: 20 mmol/L — ABNORMAL LOW (ref 22–32)
Calcium: 8.9 mg/dL (ref 8.9–10.3)
Chloride: 99 mmol/L — ABNORMAL LOW (ref 101–111)
Creatinine, Ser: 4.69 mg/dL — ABNORMAL HIGH (ref 0.61–1.24)
GFR calc Af Amer: 19 mL/min — ABNORMAL LOW (ref >60)
GFR calc non Af Amer: 16 mL/min — ABNORMAL LOW (ref >60)
Glucose, Bld: 100 mg/dL — ABNORMAL HIGH (ref 65–99)
Potassium: 4.7 mmol/L (ref 3.5–5.1)
Sodium: 135 mmol/L (ref 135–145)
Total Bilirubin: 0.9 mg/dL (ref 0.3–1.2)
Total Protein: 10.6 g/dL — ABNORMAL HIGH (ref 6.5–8.1)

## 2017-05-24 LAB — C DIFFICILE QUICK SCREEN W PCR REFLEX
C Diff antigen: NEGATIVE
C Diff interpretation: NOT DETECTED
C Diff toxin: NEGATIVE

## 2017-05-24 LAB — URINALYSIS, ROUTINE W REFLEX MICROSCOPIC
Bacteria, UA: NONE SEEN
Bilirubin Urine: NEGATIVE
Glucose, UA: NEGATIVE mg/dL
Ketones, ur: NEGATIVE mg/dL
Leukocytes, UA: NEGATIVE
Nitrite: NEGATIVE
Protein, ur: 100 mg/dL — AB
Specific Gravity, Urine: 1.016 (ref 1.005–1.030)
Squamous Epithelial / LPF: NONE SEEN
pH: 6 (ref 5.0–8.0)

## 2017-05-24 LAB — CREATININE, URINE, RANDOM: Creatinine, Urine: 253.35 mg/dL

## 2017-05-24 LAB — CBC
HCT: 44.9 % (ref 39.0–52.0)
Hemoglobin: 15.4 g/dL (ref 13.0–17.0)
MCH: 32.4 pg (ref 26.0–34.0)
MCHC: 34.3 g/dL (ref 30.0–36.0)
MCV: 94.3 fL (ref 78.0–100.0)
Platelets: 188 10*3/uL (ref 150–400)
RBC: 4.76 MIL/uL (ref 4.22–5.81)
RDW: 13.4 % (ref 11.5–15.5)
WBC: 8.3 10*3/uL (ref 4.0–10.5)

## 2017-05-24 LAB — LIPASE, BLOOD: Lipase: 21 U/L (ref 11–51)

## 2017-05-24 LAB — SODIUM, URINE, RANDOM: Sodium, Ur: 101 mmol/L

## 2017-05-24 MED ORDER — LEVETIRACETAM 500 MG PO TABS
500.0000 mg | ORAL_TABLET | Freq: Two times a day (BID) | ORAL | Status: DC
  Administered 2017-05-25 – 2017-05-26 (×3): 500 mg via ORAL
  Filled 2017-05-24 (×4): qty 1

## 2017-05-24 MED ORDER — HEPARIN SODIUM (PORCINE) 5000 UNIT/ML IJ SOLN
5000.0000 [IU] | Freq: Three times a day (TID) | INTRAMUSCULAR | Status: DC
  Administered 2017-05-24 – 2017-05-26 (×6): 5000 [IU] via SUBCUTANEOUS
  Filled 2017-05-24 (×6): qty 1

## 2017-05-24 MED ORDER — SODIUM CHLORIDE 0.9 % IV BOLUS (SEPSIS)
2000.0000 mL | Freq: Once | INTRAVENOUS | Status: AC
  Administered 2017-05-24: 2000 mL via INTRAVENOUS

## 2017-05-24 MED ORDER — ONDANSETRON HCL 4 MG PO TABS: 4.0000 mg | ORAL_TABLET | Freq: Four times a day (QID) | ORAL | Status: DC | PRN

## 2017-05-24 MED ORDER — ONDANSETRON 4 MG PO TBDP: 4.0000 mg | ORAL_TABLET | Freq: Once | ORAL | Status: DC | PRN

## 2017-05-24 MED ORDER — ACETAMINOPHEN 650 MG RE SUPP
650.0000 mg | Freq: Four times a day (QID) | RECTAL | Status: DC | PRN
Start: 2017-05-24 — End: 2017-05-26

## 2017-05-24 MED ORDER — SODIUM CHLORIDE 0.9 % IV SOLN
INTRAVENOUS | Status: AC
  Administered 2017-05-24 (×2): via INTRAVENOUS
  Administered 2017-05-25: 1000 mL via INTRAVENOUS
  Administered 2017-05-25: 05:00:00 via INTRAVENOUS

## 2017-05-24 MED ORDER — ONDANSETRON HCL 4 MG/2ML IJ SOLN
4.0000 mg | Freq: Once | INTRAMUSCULAR | Status: AC
  Administered 2017-05-24: 4 mg via INTRAVENOUS
  Filled 2017-05-24: qty 2

## 2017-05-24 MED ORDER — IOPAMIDOL (ISOVUE-300) INJECTION 61%
INTRAVENOUS | Status: AC
  Filled 2017-05-24: qty 30

## 2017-05-24 MED ORDER — ACETAMINOPHEN 325 MG PO TABS: 650.0000 mg | ORAL_TABLET | Freq: Four times a day (QID) | ORAL | Status: DC | PRN

## 2017-05-24 MED ORDER — ONDANSETRON HCL 4 MG/2ML IJ SOLN
4.0000 mg | Freq: Four times a day (QID) | INTRAMUSCULAR | Status: DC | PRN
  Administered 2017-05-24 – 2017-05-25 (×4): 4 mg via INTRAVENOUS
  Filled 2017-05-24 (×4): qty 2

## 2017-05-24 MED ORDER — SODIUM CHLORIDE 0.9% FLUSH
3.0000 mL | Freq: Two times a day (BID) | INTRAVENOUS | Status: DC
  Administered 2017-05-24 – 2017-05-26 (×3): 3 mL via INTRAVENOUS

## 2017-05-24 NOTE — ED Notes (Signed)
Pt to CT

## 2017-05-24 NOTE — Progress Notes (Signed)
Patient admitted to 5W 21 from ED with family at bedside. Alert and oriented x 4. Patient oriented to room and put on enteric precautions for pending c-diff and GI panel. IV in RAC with NS infusing. Skin intact. No telemetry orders placed. Will continue to monitor.   Sherlon HandingElisa Kamrin Spath, RN

## 2017-05-24 NOTE — ED Triage Notes (Signed)
Pt endorses n/v/d ongoing for 3 days- sts unable to keep anything down or in. Pt denies sick contacts, recent abx use, food poisoning. Pt endorses intermittent dizzy episodes- denies passing out

## 2017-05-24 NOTE — ED Notes (Signed)
Pt returned fromt CT

## 2017-05-24 NOTE — H&P (Signed)
Date: 05/24/2017               Patient Name:  [name] [name] MRN: [id number]  DOB: 1996-02-04 Age / Sex: 21 y.o., male   PCP: Patient, No Pcp Per         Medical Service: Internal Medicine Teaching Service         Attending Physician: Dr. Evette Doffing, Mallie Mussel, *    First Contact: Dr. Aggie Hacker Pager: [phone]  Second Contact: Dr. Juleen China Pager: [phone]       After Hours (After 5p/  First Contact Pager: [phone]  weekends / holidays): Second Contact Pager: [phone]   Chief Complaint: vomiting and diarrhea  History of Present Illness:  21 yo male PMHx significant for HIV and seizure disorder presenting with three days of watery diarrhea and vomiting. The patient states that three days ago he began having  nausea, vomiting, and watery diarrhea. He states that every time he attempted to eat he would have non-bilious, non-bloody vomiting. Eventually, this progressed to him vomiting after attempting to drink water. He describes the diarrhea as watery, non-bloody, and was occurring every thirty minutes to one hour with some associated lower abdominal cramping. His bowel movements are otherwise painless. He says that his symptoms did not get progressively worse, but were constant for the past three days. He denies sick contacts, recent travel, or changes in diet/no concerning foods. He denies associated fever or chills. He endorses headache three days ago with associated photophobia, which has since resolved. He also complains of dizziness and lightheadedness upon standing, as well as muscle cramps in his legs. He denies shortness of breath or chest pain.  The patient is adherent with his HIV medications.    In the ED, his initital vitals were significant for tachycardia with HR at 103 and hypotensive at 87/59, but afebrile and sating 99% on room air. Serum creatinine 4.69, BUN 36, WBC 8.3, lipase 21. CT abdomen could not exclude rectosigmoid wall thickening/colitis/proctitis, but was otherwise  unremarkable. He received 4 liters of IV fluid and zofran. UA showed hematuria, but no sign of infection. He had no more episodes of diarrhea or vomiting while in the ED.   Meds:  Current Meds  Medication Sig  . emtricitabine-tenofovir AF (DESCOVY) 200-25 MG tablet Take 1 tablet by mouth daily.  Marland Kitchen levETIRAcetam (KEPPRA) 500 MG tablet Take 1 tablet (500 mg total) by mouth 2 (two) times daily.  Marland Kitchen PREZCOBIX 800-150 MG tablet TAKE 1 TABLET BY MOUTH DAILY WITH BREAKFAST. SWALLOW WHOLE, DO NOT CRUSH, BREAK, OR CHEW TABLETS     Allergies: Allergies as of 05/24/2017  . (No Known Allergies)   Past Medical History:  Diagnosis Date  . Hepatitis B immune   . HIV disease ([place])   . Immune deficiency disorder ([place])   . 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 Syphilis     Family History:  Denies family history.  Social History:   Social History Main Topics  . Smoking status: Current Every Day Smoker    Packs/day: 0.25    Types: Cigarettes  . Smokeless tobacco: Never Used       . Alcohol use No  . Drug use: Yes    Types: Marijuana    Review of Systems: A complete ROS was negative except as per HPI.  Physical Exam: Blood pressure 114/74, pulse 64, temperature 98.2 F (36.8 C), temperature source Oral, resp. rate 17, height 6' 6"  (1.981  m), weight 175 lb (79.4 kg), SpO2 100 %.  General: Laying in bed comfortably, NAD HEENT: [place]/AT, EOMI, no scleral icterus, PERRL Cardiac: RRR, No R/M/G appreciated Pulm: normal effort, CTAB Abd: soft, mild TTP in lower abdomen, non distended, BS normal Ext: extremities well perfused, no peripheral edema Neuro: alert and oriented X3, cranial nerves II-XII grossly intact  EKG: none to review  CXR: none to review  Assessment & Plan by Problem: Principal Problem:   Acute renal failure (ARF) (HCC) Active Problems:   Seizure (HCC)   AIDS (HCC)   Hypotension  Acute Renal Failure Likely pre-renal in the  setting of  profuse watery diarrhea and vomiting for several days, and minimal PO intake. Creatine 4.69, BUN 36. Baseline creatinine around 1.05. BUN/Cr 7.6 and FEna 1.4% not consistent with pre-renal but more with intrarenal etiology. To note FENa calculated with urine chemistry after patient received IVF. S/p 4 liters of IVF in ED.  -125 cc/hr NS -BMET in AM  -If no improvement in Cr consider renal u/s and intrarenal cause of AKI  Vomiting, Diarrhea  Acute gastroenteritis for 3 days, has resolved since arrival to the hospital. The patient's HIV status makes him susceptible to pathogens such as cryptosporidium, MAC, and CMV. Profuse watery diarrhea most consistent with cryptosporidium. The patient is on bactrim for PCP prophylaxis so is at risk for C. Diff.  Food borne illnesses and other bacterial infections are also on the differential.   -Gastrointestinal Panel -Ova and Parasite  -Acid fast smear and acid fast culture of stool -C. Diff PCR -Stool culture -Continue to monitor symptoms and support accordingly  HIV Diagnosed in 2013. Most recent viral load was undetectable and CD4 count was 150 in 01/2017. Prior CD4 of 10 in 8/2017and HIV RNA of 135,000. The patient takes Prezcobix and Descovy. Currently on bactrim for ppx. Patient is adherent with medications.  -Continue Prezcobix and Descovy tomorrow pending serum creatinine  -Hold Bactrim  -CD4 and viral load pending   Dispo: Admit patient to Inpatient with expected length of stay greater than 2 midnights.  Signed: Melanee Spry, MD 05/24/2017, 5:52 PM  Pager: [phone]

## 2017-05-24 NOTE — ED Notes (Signed)
Attempted report 

## 2017-05-24 NOTE — ED Provider Notes (Signed)
[facility]EMERGENCY DEPT Provider Note   CSN: [id number] Arrival date & time: 05/24/17  1044     History   Chief Complaint Chief Complaint  Patient presents with  . Emesis    HPI [name] [name] is a 21 y.o. male.  Patient is HIV patient followed by infectious disease. Patient with acute onset of nausea vomiting and diarrhea 3 days ago. With marked episodes of diarrhea. No blood in vomit or diarrhea. Patient has felt lightheaded. Feels it is extremely dehydrated. Not able to keep anything down.      Past Medical History:  Diagnosis Date  . Hepatitis B immune   . HIV disease (HCC)   . Immune deficiency disorder (HCC)   . 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 Syphilis     Patient Active Problem List   Diagnosis Date Noted  . Abnormal brain MRI 02/07/2017  . Rash 11/15/2016  . Hypotension 11/05/2016  . Shingles 09/14/2016  . 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 Depression 09/08/2016  . AIDS (HCC)   . Seizure (HCC) 08/29/2016    History reviewed. No pertinent surgical history.     Home Medications    Prior to Admission medications   Medication Sig Start Date End Date Taking? Authorizing Provider  emtricitabine-tenofovir AF (DESCOVY) 200-25 MG tablet Take 1 tablet by mouth daily. 04/05/17  Yes Ginnie Smart, MD  levETIRAcetam (KEPPRA) 500 MG tablet Take 1 tablet (500 mg total) by mouth 2 (two) times daily. 02/07/17  Yes Van Clines, MD  PREZCOBIX 800-150 MG tablet TAKE 1 TABLET BY MOUTH DAILY WITH BREAKFAST. SWALLOW WHOLE, DO NOT CRUSH, BREAK, OR CHEW TABLETS 04/03/17  Yes Ginnie Smart, MD  hydrocortisone cream 1 % Apply topically 3 (three) times daily as needed for itching. Patient not taking: Reported on 09/14/2016 09/01/16   Reymundo Poll, MD  hydrOXYzine (ATARAX/VISTARIL) 10 MG tablet Take 1 tablet (10 mg total) by mouth 3 (three) times daily as needed. Patient not taking: Reported on 05/24/2017 11/15/16   Ginnie Smart, MD  sulfamethoxazole-trimethoprim (BACTRIM DS,SEPTRA DS) 800-160 MG tablet Take 1 tablet by mouth daily. Patient not taking: Reported on 09/14/2016 09/02/16   Ginnie Smart, MD  valACYclovir (VALTREX) 1000 MG tablet Take 1 tablet (1,000 mg total) by mouth 2 (two) times daily. Patient not taking: Reported on 05/24/2017 10/27/16   Ginnie Smart, MD    Family History No family history on file.  Social History Social History  Substance Use Topics  . Smoking status: Current Every Day Smoker    Packs/day: 0.50    Types: Cigarettes  . Smokeless tobacco: Never Used     Comment: trying to cut back   . Alcohol use No     Allergies   Patient has no known allergies.   Review of Systems Review of Systems  Constitutional: Negative for fever.  HENT: Negative for congestion.   Eyes: Negative for visual disturbance.  Respiratory: Negative for shortness of breath.   Cardiovascular: Negative for chest pain.  Gastrointestinal: Positive for abdominal pain, diarrhea, nausea and vomiting. Negative for blood in stool.  Genitourinary: Negative for dysuria.  Musculoskeletal: Negative for back pain.  Skin: Negative for rash.  Neurological: Negative for headaches.  Psychiatric/Behavioral: Negative for confusion.     Physical Exam Updated Vital Signs BP 114/76   Pulse 60   Temp 98.2 F (36.8 C) (Oral)   Resp 17   Ht 1.981 m (6\' 6" )  Wt 79.4 kg (175 lb)   SpO2 100%   BMI 20.22 kg/m   Physical Exam  Constitutional: He is oriented to person, place, and time. He appears well-developed and well-nourished. No distress.  HENT:  Head: Normocephalic and atraumatic.  Very dry  Eyes: Pupils are equal, round, and reactive to light. Conjunctivae and EOM are normal.  Neck: Normal range of motion. Neck supple.  Cardiovascular: Normal rate, regular rhythm and normal heart sounds.   Pulmonary/Chest: Effort normal and breath sounds normal.  Abdominal: Soft. Bowel sounds are normal.  He exhibits no distension. There is no tenderness.  Musculoskeletal: Normal range of motion. He exhibits no edema.  Neurological: He is alert and oriented to person, place, and time. No cranial nerve deficit or sensory deficit. He exhibits normal muscle tone. Coordination normal.  Skin: Skin is warm.  Nursing note and vitals reviewed.    ED Treatments / Results  Labs (all labs ordered are listed, but only abnormal results are displayed) Labs Reviewed  COMPREHENSIVE METABOLIC PANEL - Abnormal; Notable for the following:       Result Value   Chloride 99 (*)    CO2 20 (*)    Glucose, Bld 100 (*)    BUN 36 (*)    Creatinine, Ser 4.69 (*)    Total Protein 10.6 (*)    GFR calc non Af Amer 16 (*)    GFR calc Af Amer 19 (*)    Anion gap 16 (*)    All other components within normal limits  LIPASE, BLOOD  CBC  DIFFERENTIAL  URINALYSIS, ROUTINE W REFLEX MICROSCOPIC    EKG  EKG Interpretation None       Radiology Ct Abdomen Pelvis Wo Contrast  Result Date: 05/24/2017 CLINICAL DATA:  Nausea, vomiting, and diarrhea for 3 days, suspected gastroenteritis or colitis, history HIV EXAM: CT ABDOMEN AND PELVIS WITHOUT CONTRAST TECHNIQUE: Multidetector CT imaging of the abdomen and pelvis was performed following the standard protocol without IV contrast. Sagittal and coronal MPR images reconstructed from axial data set. Patient drank dilute oral contrast for exam COMPARISON:  08/13/2016 FINDINGS: Lower chest: Lung bases clear Hepatobiliary: Contracted gallbladder.  Liver unremarkable. Pancreas: Pancreas grossly normal appearance for technique Spleen: Normal appearance Adrenals/Urinary Tract: Adrenal glands unremarkable. Kidneys and bladder grossly normal appearance. No definite urinary tract calcification or dilatation. Stomach/Bowel: Cannot exclude rectosigmoid wall thickening/colitis/proctitis. Stomach incompletely distended. Stomach and bowel loops otherwise grossly unremarkable. Normal  appendix. Vascular/Lymphatic: Aorta normal caliber.  No definite adenopathy. Reproductive: N/A Other: No gross free air or free fluid.  No hernia. Musculoskeletal: Bones normal appearance. IMPRESSION: Unable to exclude rectosigmoid colitis/proctitis ; recommend correlation with proctoscopy. Remainder of exam grossly unremarkable. Electronically Signed   By: Ulyses Southward M.D.   On: 05/24/2017 15:30    Procedures Procedures (including critical care time)  CRITICAL CARE Performed by: Vanetta Mulders Total critical care time: 45 minutes Critical care time was exclusive of separately billable procedures and treating other patients. Critical care was necessary to treat or prevent imminent or life-threatening deterioration. Critical care was time spent personally by me on the following activities: development of treatment plan with patient and/or surrogate as well as nursing, discussions with consultants, evaluation of patient's response to treatment, examination of patient, obtaining history from patient or surrogate, ordering and performing treatments and interventions, ordering and review of laboratory studies, ordering and review of radiographic studies, pulse oximetry and re-evaluation of patient's condition.   Medications Ordered in ED Medications  ondansetron (ZOFRAN-ODT) disintegrating tablet  4 mg (not administered)  0.9 %  sodium chloride infusion ( Intravenous New Bag/Given 05/24/17 1221)  iopamidol (ISOVUE-300) 61 % injection (not administered)  sodium chloride 0.9 % bolus 2,000 mL (0 mLs Intravenous Stopped 05/24/17 1414)  ondansetron (ZOFRAN) injection 4 mg (4 mg Intravenous Given 05/24/17 1220)  sodium chloride 0.9 % bolus 2,000 mL (2,000 mLs Intravenous New Bag/Given 05/24/17 1440)     Initial Impression / Assessment and Plan / ED Course  I have reviewed the triage vital signs and the nursing notes.  Pertinent labs & imaging results that were available during my care of the patient  were reviewed by me and considered in my medical decision making (see chart for details).    Patient with a history of HIV. Followed by infectious disease.Patient with complaint of nausea vomiting and diarrhea for 3 days. Yesterday the diarrhea bowel movements for greater than 10. About 4-5 episodes of vomiting. It is decreed some today. Associated with some abdominal cramping but no significant abdominal pain. Patient states she's been unable to keep anything down. Denies any sick contacts. No recent antibiotic use. No foods of any concern no recent out of the [place].Patient does feel dizzy when he is up and about. And feels as if maybe he's got a maybe pass out but has not.  Workup here patient blood pressure at times was below 90. Other times is around 92 systolic patient clinically appeared very dry. Abdomen soft flat.Patient was felt to be significant he dehydrated. Patient given 2 L of fluid with some improvement in the blood pressure. Patient then had another 2 L ordered. Patient's BUN and CR is markedly elevated compared to his previous ones but has not had any recent one since February.  Although this technically is acute renal failure. Patient's potassium is reasonable I think this is a prerenal picture from dehydration. That's why the additional 2 L of fluid was ordered. CT scan of the abdomen show some inflammatory changes in the sigmoid colon and the rectum. Patient will require admission for further hydration and to make sure that the renal function improves. Probably will require infectious disease consultation.  Patient is on anti-viral Patient's CD4 counts have been on the low side. Patient here not tachycardic not febrile do not feel that the patient is septic.  Patient's systolic blood pressures are now above 100 following the fluids.  Discussed with family practice and they will admit.   Final Clinical Impressions(s) / ED Diagnoses   Final diagnoses:  Nausea vomiting and diarrhea    Colitis  Proctitis  HIV (human immunodeficiency virus infection) (HCC)  Acute renal failure, unspecified acute renal failure type (HCC)  Dehydration    New Prescriptions New Prescriptions   No medications on file     Vanetta Mulders, MD 05/24/17 [phone]

## 2017-05-25 DIAGNOSIS — R112 Nausea with vomiting, unspecified: Secondary | ICD-10-CM

## 2017-05-25 LAB — CBC
HCT: 36.2 % — ABNORMAL LOW (ref 39.0–52.0)
Hemoglobin: 12.4 g/dL — ABNORMAL LOW (ref 13.0–17.0)
MCH: 32.3 pg (ref 26.0–34.0)
MCHC: 34.3 g/dL (ref 30.0–36.0)
MCV: 94.3 fL (ref 78.0–100.0)
Platelets: 157 10*3/uL (ref 150–400)
RBC: 3.84 MIL/uL — ABNORMAL LOW (ref 4.22–5.81)
RDW: 13.8 % (ref 11.5–15.5)
WBC: 3.9 10*3/uL — ABNORMAL LOW (ref 4.0–10.5)

## 2017-05-25 LAB — GASTROINTESTINAL PANEL BY PCR, STOOL (REPLACES STOOL CULTURE)
Adenovirus F40/41: NOT DETECTED
Astrovirus: NOT DETECTED
Campylobacter species: NOT DETECTED
Cryptosporidium: DETECTED — AB
Cyclospora cayetanensis: NOT DETECTED
Entamoeba histolytica: NOT DETECTED
Enteroaggregative E coli (EAEC): NOT DETECTED
Enteropathogenic E coli (EPEC): DETECTED — AB
Enterotoxigenic E coli (ETEC): NOT DETECTED
Giardia lamblia: NOT DETECTED
Norovirus GI/GII: NOT DETECTED
Plesimonas shigelloides: NOT DETECTED
Rotavirus A: NOT DETECTED
Salmonella species: NOT DETECTED
Sapovirus (I, II, IV, and V): NOT DETECTED
Shiga like toxin producing E coli (STEC): NOT DETECTED
Shigella/Enteroinvasive E coli (EIEC): NOT DETECTED
Vibrio cholerae: NOT DETECTED
Vibrio species: NOT DETECTED
Yersinia enterocolitica: NOT DETECTED

## 2017-05-25 LAB — BASIC METABOLIC PANEL
Anion gap: 5 (ref 5–15)
BUN: 28 mg/dL — ABNORMAL HIGH (ref 6–20)
CO2: 22 mmol/L (ref 22–32)
Calcium: 8.1 mg/dL — ABNORMAL LOW (ref 8.9–10.3)
Chloride: 110 mmol/L (ref 101–111)
Creatinine, Ser: 2.17 mg/dL — ABNORMAL HIGH (ref 0.61–1.24)
GFR calc Af Amer: 48 mL/min — ABNORMAL LOW (ref >60)
GFR calc non Af Amer: 41 mL/min — ABNORMAL LOW (ref >60)
Glucose, Bld: 89 mg/dL (ref 65–99)
Potassium: 3.8 mmol/L (ref 3.5–5.1)
Sodium: 137 mmol/L (ref 135–145)

## 2017-05-25 LAB — T-HELPER CELLS (CD4) COUNT (NOT AT ARMC)
CD4 % Helper T Cell: 12 % — ABNORMAL LOW (ref 33–55)
CD4 T Cell Abs: 220 /uL — ABNORMAL LOW (ref 400–2700)

## 2017-05-25 LAB — HIV-1 RNA QUANT-NO REFLEX-BLD
HIV 1 RNA Quant: 41300 copies/mL
LOG10 HIV-1 RNA: 4.616 log10copy/mL

## 2017-05-25 MED ORDER — DARUNAVIR-COBICISTAT 800-150 MG PO TABS
1.0000 | ORAL_TABLET | Freq: Every day | ORAL | Status: DC
  Administered 2017-05-25 – 2017-05-26 (×2): 1 via ORAL
  Filled 2017-05-25 (×2): qty 1

## 2017-05-25 MED ORDER — EMTRICITABINE-TENOFOVIR AF 200-25 MG PO TABS
1.0000 | ORAL_TABLET | Freq: Every day | ORAL | Status: DC
  Administered 2017-05-25 – 2017-05-26 (×2): 1 via ORAL
  Filled 2017-05-25 (×2): qty 1

## 2017-05-25 NOTE — Progress Notes (Signed)
CRITICAL VALUE ALERT  Critical Value:  GI pathogen - positive  Date & Time Notied:  05/26/2017 - 17:03  Provider Notified: Dr. Oswaldo DoneVincent  Orders Received/Actions taken: N/A

## 2017-05-25 NOTE — Progress Notes (Signed)
   Subjective: Patient seen and examined. He states he had about 3-4 episodes of diarrhea overnight and into the morning. He states that they are watery and non-bloody. He denies any episodes of vomiting.   Objective:  Vital signs in last 24 hours: Vitals:   05/24/17 1635 05/24/17 1830 05/24/17 2119 05/25/17 0444  BP: 114/74 110/61 (!) 90/47 113/72  Pulse: 64 (!) 59 (!) 58 (!) 58  Resp: _0 Temp:  98.1 F (36.7 C) 98.2 F (36.8 C) 98.1 F (36.7 C)  TempSrc:  Oral Oral Oral  SpO2: 100% 98% 100% 100%  Weight:  175 lb 9.6 oz (79.7 kg)    Height:  _1  (1.981 m)     General: Laying in bed comfortably, NAD HEENT: [place]/AT, EOMI, no scleral icterus, PERRL Cardiac: RRR, No R/M/G appreciated Pulm: normal effort, CTAB Abd: soft, non tender, non distended, BS normal Ext: extremities well perfused, no peripheral edema Neuro: alert and oriented X3, cranial nerves II-XII grossly intact   Assessment/Plan:  Principal Problem:   Acute renal failure (ARF) (HCC) Active Problems:   Seizure (HCC)   AIDS (HCC)   Hypotension  Acute Renal Failure Pre-renal, 2/2 to hypovolemia in the setting of watery diarrhea and vomiting. Improving Creatine 4.69>>2.17. Baseline creatinine around 1.05.   -125 cc/hr NS -BMET in AM   Vomiting, Diarrhea  Acute gastroenteritis, improving but continuing to have diarrhea. The patient's HIV status makes him susceptible to pathogens such as cryptosporidium, MAC, and CMV.  Food borne illnesses and other bacterial infections are also on the differential.  C. Diff negative.  -Gastrointestinal Panel -Ova and Parasite  -Acid fast smear and acid fast culture of stool -Stool culture -Continue to monitor symptoms and support accordingly  HIV Diagnosed in 2013. Most recent viral load was undetectable and CD4 count was 150 in 01/2017. Prior CD4 of 10 in 8/2017and HIV RNA of 135,000. The patient takes Prezcobix and Descovy. CD4 count 220.   -Continue Prezcobix  and Descovy  -Viral load pending    Dispo: Anticipated discharge in approximately 1-2 days. Melanee Spry, MD 05/25/2017, 1:29 PM Pager: [phone]

## 2017-05-25 NOTE — Discharge Summary (Signed)
Name: [name] [name] MRN: [id number] DOB: 1996-01-09 21 y.o. PCP: [name] SmartHatcher, Jeffrey C, MD  Date of Admission: 05/24/2017 11:30 AM Date of Discharge: 05/26/2017 Attending Physician: Dr. Erlinda HongVincent, [name] Discharge Diagnosis: 1. 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   hydrOXYzine 10 MG tablet Commonly known as:  ATARAX/VISTARIL Take 1 tablet (10 mg total) by mouth 3 (three) times daily as needed.   levETIRAcetam 500 MG tablet Commonly known as:  KEPPRA Take 1 tablet (500 mg total) by mouth 2 (two) times daily.   ondansetron 4 MG tablet Commonly known as:  ZOFRAN Take 1 tablet (4 mg total) by mouth every 6 (six) hours as needed for nausea.   PREZCOBIX 800-150 MG tablet Generic drug:  darunavir-cobicistat TAKE 1 TABLET BY MOUTH DAILY WITH BREAKFAST. SWALLOW WHOLE, DO NOT CRUSH, BREAK, OR CHEW TABLETS            Discharge Care Instructions        Start     Ordered   05/26/17 0000  ondansetron (ZOFRAN) 4 MG tablet  Every 6 hours PRN     05/26/17 1524   05/26/17 0000  Increase activity slowly     05/26/17 1524   05/26/17 0000  Diet - low sodium heart healthy     05/26/17 1524   05/26/17 0000  Discharge instructions    Comments:  It was pleasure taking care of you. Please keep your self well hydrated, advance your  diet slowly and as tolerated. Please start taking your HIV medicines regularly, also called your HIV clinic to make an appointment as soon as possible. We are giving you some prescription medicine for your nausea, you can take it if needed.   05/26/17 1524   05/26/17 0000  Call MD for:  temperature >100.4     05/26/17 1524   05/26/17 0000  Call MD for:  persistant nausea and vomiting     05/26/17 1524      Disposition and follow-up:   [name] [name] [facility].  At the hospital follow up visit please address:  1. Resolution of diarrhea, vomiting. Adherence to ART therapy, Infectious disease follow up  2.  Labs / imaging needed at time of follow-up: BMET  3.  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 Acute Renal Failure Mr. [name] [name] was [facility] and the Internal Medicine Teaching service for acute renal failure secondary  to hypovolemia. Upon initial presentation, the patient was hypotensive and tachycardic and had been experiencing three days of profuse watery diarrhea and vomiting. CT of the abdomen was unable to exclude rectosigmoid wall thickening/colitis/proctitis. Serum creatinine obtained in the emergency department was 4.69. The patient's baseline is around 1.0.  He was given four liters of fluid in the emergency department and was continued on maintenance IV fluids. Serum creatinine improved to 2.17 and then to 1.44 prior to discharge.   2. Gastroenteritis/colitis The patient was maintained on IV fluids until he was able to tolerate food  and water. Extensive stool testing was performed. Gastrointestinal panel was positive for Enteropathogenic E. Coli and Cryptosporidium. Infectious Disease was consulted and did not recommend anti-microbial treatment, as his symptoms will likely spontaneously resolve and improve with adherence to ART regimen. He was tolerating PO and able to maintain hydration prior to discharge.    3. HIV The patient was continued on his current ART medications, Prezcobix and Descovy. Repeat CD4 count was 220 and viral load was 41,300. He was instructed to follow up with ID.   Discharge Vitals:   BP (!) 121/55   Pulse (!) 57   Temp 98.3 F (36.8 [name]) (Oral)   Resp 20   Ht 6\' 6"  (1.981 m)   Wt 175 lb 9.6 oz (79.7 kg)   SpO2 100%   BMI 20.29 kg/m   Pertinent Labs, Studies, and Procedures:  CT Abdomen and Pelvis IMPRESSION: Unable to exclude rectosigmoid colitis/proctitis ; recommend correlation with proctoscopy. Remainder of exam grossly unremarkable.  CBC Latest Ref Rng & Units 05/26/2017 05/25/2017 05/24/2017  WBC 4.0 - 10.5 K/uL 3.8(L) 3.9(L) 8.3  Hemoglobin 13.0 - 17.0 g/dL 16.1 12.4(L) 15.4  Hematocrit 39.0 - 52.0 % 39.8 36.2(L) 44.9  Platelets 150 - 400 K/uL 162 157 188   CMP Latest Ref Rng & Units 05/26/2017 05/25/2017 05/24/2017  Glucose 65 - 99 mg/dL 87 89 096(E)  BUN 6 - 20 mg/dL 16 45(W) 09(W)  Creatinine 0.61 - 1.24 mg/dL 1.19(J) 4.78(G) 9.56(O)  Sodium 135 - 145 mmol/L 137 137 135  Potassium 3.5 - 5.1 mmol/L 3.8 3.8 4.7  Chloride 101 - 111 mmol/L 108 110 99(L)  CO2 22 - 32 mmol/L 23 22 20(L)  Calcium 8.9 - 10.3 mg/dL 1.3(Y) 8.1(L) 8.9  Total Protein 6.5 - 8.1 g/dL - - 10.6(H)  Total Bilirubin 0.3 - 1.2 mg/dL - - 0.9  Alkaline Phos 38 - 126 U/L - - 81  AST 15 - 41 U/L - - 35  ALT 17 - 63 U/L - - 18   Lab Results  Component Value Date   CD4TABS 220 (L) 05/24/2017   CD4TABS 150 (L) 01/31/2017   CD4TABS 110 (L) 11/01/2016    Discharge Instructions: Discharge Instructions    Call MD  for:  persistant nausea and vomiting    Complete by:  As directed    Call MD for:  temperature >100.4    Complete by:  As directed    Diet - low sodium heart healthy    Complete by:  As directed    Discharge instructions    Complete by:  As directed    It was pleasure taking care of you. Please keep your self well hydrated, advance your diet slowly and as tolerated. Please start taking your HIV medicines regularly, also called your HIV clinic to make an appointment as soon as possible. We are giving you some prescription medicine for your nausea, you can take it  if needed.   Increase activity slowly    Complete by:  As directed       Signed: Ginger Carne, MD 05/26/2017, 7:01 PM   Pager: [phone]

## 2017-05-26 ENCOUNTER — Ambulatory Visit: Payer: Self-pay | Admitting: *Deleted

## 2017-05-26 DIAGNOSIS — I959 Hypotension, unspecified: Secondary | ICD-10-CM

## 2017-05-26 DIAGNOSIS — N179 Acute kidney failure, unspecified: Principal | ICD-10-CM

## 2017-05-26 DIAGNOSIS — G40909 Epilepsy, unspecified, not intractable, without status epilepticus: Secondary | ICD-10-CM

## 2017-05-26 DIAGNOSIS — A040 Enteropathogenic Escherichia coli infection: Secondary | ICD-10-CM | POA: Diagnosis present

## 2017-05-26 DIAGNOSIS — B20 Human immunodeficiency virus [HIV] disease: Secondary | ICD-10-CM

## 2017-05-26 DIAGNOSIS — Z79899 Other long term (current) drug therapy: Secondary | ICD-10-CM

## 2017-05-26 DIAGNOSIS — A072 Cryptosporidiosis: Secondary | ICD-10-CM

## 2017-05-26 LAB — BASIC METABOLIC PANEL
Anion gap: 6 (ref 5–15)
BUN: 16 mg/dL (ref 6–20)
CO2: 23 mmol/L (ref 22–32)
Calcium: 8.8 mg/dL — ABNORMAL LOW (ref 8.9–10.3)
Chloride: 108 mmol/L (ref 101–111)
Creatinine, Ser: 1.44 mg/dL — ABNORMAL HIGH (ref 0.61–1.24)
GFR calc Af Amer: >60 mL/min (ref >60)
GFR calc non Af Amer: >60 mL/min (ref >60)
Glucose, Bld: 87 mg/dL (ref 65–99)
Potassium: 3.8 mmol/L (ref 3.5–5.1)
Sodium: 137 mmol/L (ref 135–145)

## 2017-05-26 LAB — ACID FAST SMEAR (AFB, MYCOBACTERIA): Acid Fast Smear: NEGATIVE

## 2017-05-26 LAB — CBC
HCT: 39.8 % (ref 39.0–52.0)
Hemoglobin: 13.3 g/dL (ref 13.0–17.0)
MCH: 31.6 pg (ref 26.0–34.0)
MCHC: 33.4 g/dL (ref 30.0–36.0)
MCV: 94.5 fL (ref 78.0–100.0)
Platelets: 162 10*3/uL (ref 150–400)
RBC: 4.21 MIL/uL — ABNORMAL LOW (ref 4.22–5.81)
RDW: 13.4 % (ref 11.5–15.5)
WBC: 3.8 10*3/uL — ABNORMAL LOW (ref 4.0–10.5)

## 2017-05-26 MED ORDER — ONDANSETRON HCL 4 MG PO TABS: 4.0000 mg | ORAL_TABLET | Freq: Four times a day (QID) | ORAL | 0 refills | Status: DC | PRN

## 2017-05-26 NOTE — Progress Notes (Addendum)
   Subjective: Patient seen and examined. He states he had about 2 episodes of diarrhea overnight and into the morning that woke him from sleep. He also had about 4-5 episodes of watery diarrhea yesterday afternoon. He states that they continue to be watery and non-bloody. He denies any episodes of vomiting. He is able to eat and drink.  Objective:  Vital signs in last 24 hours: Vitals:   05/25/17 0444 05/25/17 1518 05/25/17 1932 05/26/17 0428  BP: 113/72 107/63 106/64 111/71  Pulse: (!) 58 (!) 52 (!) 50 (!) 50  Resp: 16 17 18 18   Temp: 98.1 F (36.7 C) 98.7 F (37.1 C) 98.3 F (36.8 C) 97.6 F (36.4 C)  TempSrc: Oral Oral Oral Oral  SpO2: 100% 100% 100% 100%  Weight:      Height:       General: Laying in bed comfortably, NAD HEENT: [place]/AT, EOMI, no scleral icterus, PERRL Cardiac: RRR, No R/M/G appreciated Pulm: normal effort, CTAB Abd: soft, non tender, non distended, BS normal Ext: extremities well perfused, no peripheral edema Neuro: alert and oriented X3, cranial nerves II-XII grossly intact   Assessment/Plan:  Principal Problem:   Acute renal failure (ARF) (HCC) Active Problems:   Seizure (HCC)   AIDS (HCC)   Hypotension  Acute Renal Failure Pre-renal, 2/2 to hypovolemia in the setting of watery diarrhea and vomiting. Continuing to improve. Creatine 4.69>>2.17>>1.44. Baseline creatinine around 1.05.   -Stopped fluids yesterday evening, will continue to monitor  -will consider restarting IVF if decreased PO intake and frequent episodes of diarrhea  Vomiting, Diarrhea  Acute gastroenteritis/colitis secondary to Enteropathogenic E. Coli and Cryptosporidium. No longer vomiting but is continuing to have watery diarrhea several times a day.  -ID consulted, appreciated recommendations about treatment vs supportive care -Follow up pending labs:  -Ova and Parasite    -Acid fast smear and acid fast culture of stool  -Stool culture -Continue to monitor symptoms and support  accordingly  HIV Diagnosed in 2013. Most recent viral load was undetectable and CD4 count was 150 in 01/2017. Prior CD4 of 10 in 8/2017and HIV RNA of 135,000. The patient takes Prezcobix and Descovy. CD4 count 220, viral load 41,300. -Continue Prezcobix and Descovy  -ID consulted    Dispo: Anticipated discharge in approximately 1-2 days. Toney RakesLacroce, Treveon Bourcier J, MD 05/26/2017, 12:06 PM Pager: [phone]

## 2017-05-26 NOTE — Progress Notes (Signed)
Patient discharged home around 1645. Discharge instructions provided to patient who verbalized understanding. Patient stable with no complaints of distress. IV removed and intact. No signs of skin breakdown at time of discharge.  Sherlon HandingElisa Nawal Burling, RN

## 2017-05-26 NOTE — Consult Note (Signed)
[facility]    Date of Admission:  05/24/2017          Reason for Consult: Diarrhea with stool PCR positive for Cryptosporidium and enteropathogenic Escherichia coli    Referring Provider: Dr. Erlinda Honguncan Vincent   Assessment: [name] [name] had acute gastroenteritis. Either or both Cryptosporidium and enteropathogenic Escherichia coli could be the culprit. He is better with supportive care and does not need specific antimicrobial therapy for either potential pathogen. The treatment for cryptosporidiosis is effective antiretroviral therapy which he is currently not taking. I talked to him about the importance of not missing doses and asked him to set his cell phone alarm to remind him. He says he has been using a pillbox. He has a follow-up visit scheduled with Dr. Ninetta LightsHatcher in October.   Plan: 1. I've encouraged him to try not to miss any doses of his Descovy or Prezcobix 2. 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 darunavir-cobicistat  1 tablet Oral Q breakfast  . emtricitabine-tenofovir AF  1 tablet Oral Daily  . heparin  5,000 Units Subcutaneous Q8H  . levETIRAcetam  500 mg Oral BID  . sodium chloride flush  3 mL Intravenous Q12H    HPI: [name] [name] is a 21 y.o. male with long-standing HIV infection who is followed by my partner, Dr. Enedina FinnerJeff Hatcher. When he was seen in May of this year he reported that he was taking his Descovy and Prezcobix and not missing doses. His CD4 count was low at 150 but his viral load was undetectable at less than 20. Since then he has moved into a new apartment hearing [place] and he has also been spending time in [place] after his sister had a baby. As a result he says that he has been  missing doses of his HIV the meds frequently. About 5 days ago he began having nausea, vomiting and watery diarrhea leading to admission on 05/24/2017. His GI pathogen panel is positive for Cryptosporidium and enteropathogenic Escherichia coli. He has been treated with fluids and anti-emetics and is feeling better. He is still having a little bit of diarrhea but no more vomiting. He is tolerating meals well and is eager to go home.   Review of Systems: Review of Systems  Constitutional: Negative for chills, diaphoresis and fever.  Gastrointestinal: Positive for abdominal pain, diarrhea, nausea and vomiting.    Past Medical History:  Diagnosis Date  . Hepatitis B immune   . HIV disease (HCC) dx'd ~ 2012/2013  . Immune deficiency disorder (HCC)   . 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 Seizures (HCC) 2017   "probably stress related" (05/24/2017)  . Syphilis     Social History  Substance Use Topics  . Smoking status: Current Every Day Smoker    Packs/day: 0.25    Years: 6.00    Types: Cigarettes  . Smokeless tobacco: Never Used  . Alcohol use No    History reviewed. No pertinent family history. No Known Allergies  OBJECTIVE: Blood pressure (!) 121/55, pulse (!) 57, temperature 98.3 F (36.8 C), temperature source Oral, resp. rate 20, height 6\' 6"  (1.981 m), weight 175 lb 9.6 oz (79.7 kg),  SpO2 100 %.  Physical Exam  Constitutional: He is oriented to person, place, and time.  He is alert in no distress resting in bed looking at his phone.  HENT:  Mouth/Throat: Oropharyngeal exudate present.  Cardiovascular: Regular rhythm.   No murmur heard. He is tachycardic.  Pulmonary/Chest: Effort normal and breath sounds normal.  Abdominal: Soft. Bowel sounds are normal. There is no tenderness.  Neurological: He is alert and oriented to person, place, and time.  Skin: No rash noted.  Psychiatric: Mood and affect normal.    Lab Results Lab  Results  Component Value Date   WBC 3.8 (L) 05/26/2017   HGB 13.3 05/26/2017   HCT 39.8 05/26/2017   MCV 94.5 05/26/2017   PLT 162 05/26/2017    Lab Results  Component Value Date   CREATININE 1.44 (H) 05/26/2017   BUN 16 05/26/2017   NA 137 05/26/2017   K 3.8 05/26/2017   CL 108 05/26/2017   CO2 23 05/26/2017    Lab Results  Component Value Date   ALT 18 05/24/2017   AST 35 05/24/2017   ALKPHOS 81 05/24/2017   BILITOT 0.9 05/24/2017     Microbiology: Recent Results (from the past 240 hour(s))  Gastrointestinal Panel by PCR , Stool     Status: Abnormal   Collection Time: 05/24/17  8:00 PM  Result Value Ref Range Status   Campylobacter species NOT DETECTED NOT DETECTED Final   Plesimonas shigelloides NOT DETECTED NOT DETECTED Final   Salmonella species NOT DETECTED NOT DETECTED Final   Yersinia enterocolitica NOT DETECTED NOT DETECTED Final   Vibrio species NOT DETECTED NOT DETECTED Final   Vibrio cholerae NOT DETECTED NOT DETECTED Final   Enteroaggregative E coli (EAEC) NOT DETECTED NOT DETECTED Final   Enteropathogenic E coli (EPEC) DETECTED (A) NOT DETECTED Final    Comment: RESULT CALLED TO, READ BACK BY AND VERIFIED WITH: GRETA SAOSO AT 1700 ON 05/25/2017 JJB    Enterotoxigenic E coli (ETEC) NOT DETECTED NOT DETECTED Final   Shiga like toxin producing E coli (STEC) NOT DETECTED NOT DETECTED Final   Shigella/Enteroinvasive E coli (EIEC) NOT DETECTED NOT DETECTED Final   Cryptosporidium DETECTED (A) NOT DETECTED Final    Comment: RESULT CALLED TO, READ BACK BY AND VERIFIED WITH: GRETA SAOSO AT 1700 ON 05/25/2017 JJB    Cyclospora cayetanensis NOT DETECTED NOT DETECTED Final   Entamoeba histolytica NOT DETECTED NOT DETECTED Final   Giardia lamblia NOT DETECTED NOT DETECTED Final   Adenovirus F40/41 NOT DETECTED NOT DETECTED Final   Astrovirus NOT DETECTED NOT DETECTED Final   Norovirus GI/GII NOT DETECTED NOT DETECTED Final   Rotavirus A NOT DETECTED NOT DETECTED  Final   Sapovirus (I, II, IV, and V) NOT DETECTED NOT DETECTED Final  C difficile quick scan w PCR reflex     Status: None   Collection Time: 05/24/17  8:00 PM  Result Value Ref Range Status   C Diff antigen NEGATIVE NEGATIVE Final   C Diff toxin NEGATIVE NEGATIVE Final   C Diff interpretation No C. difficile detected.  Final    Cliffton Asters, MD [facility] for Infectious Disease [facility] [phone] pager   [phone] cell 05/26/2017, 3:45 PM

## 2017-05-30 LAB — OVA + PARASITE EXAM

## 2017-05-30 LAB — STOOL CULTURE: E coli, Shiga toxin Assay: NEGATIVE

## 2017-05-30 LAB — O&P RESULT

## 2017-05-30 LAB — STOOL CULTURE REFLEX - CMPCXR

## 2017-05-30 LAB — STOOL CULTURE REFLEX - RSASHR

## 2017-07-08 LAB — ACID FAST CULTURE WITH REFLEXED SENSITIVITIES (MYCOBACTERIA): Acid Fast Culture: NEGATIVE

## 2017-07-10 NOTE — Progress Notes (Signed)
RN made a home visit today to see Roper during his hospitalization. My concern is that Lavoy's viral load has increased and is no longer undetectable. Canyon stated he has been away from [place], living in [place] and has not been focusing on his health but has instead been assisting his sister with caring for her newborn child. RN asked Michio open ended questions about his health and plans for medications management. Paul verbalized that he knows he must stay on his medication if he wants to stay away from the hospital. Clayson stated his family is coming to get him and he is not sure if they will have time to pick up his medications. RN agreed to go to the pharmacy and deliver his medications to him.   Traveled to [employer] and picked up Ramie's antiviral medication. Traveled to his home and delivered the medications to him.

## 2017-08-10 ENCOUNTER — Other Ambulatory Visit: Payer: Self-pay | Admitting: Internal Medicine

## 2017-08-11 ENCOUNTER — Ambulatory Visit: Payer: Medicaid Other | Admitting: Neurology

## 2017-08-14 ENCOUNTER — Other Ambulatory Visit: Payer: Self-pay | Admitting: Internal Medicine

## 2017-08-15 ENCOUNTER — Ambulatory Visit: Payer: Medicaid Other | Admitting: Neurology

## 2017-08-16 ENCOUNTER — Ambulatory Visit: Payer: Medicaid Other | Admitting: Infectious Diseases

## 2017-09-04 ENCOUNTER — Telehealth: Payer: Self-pay | Admitting: *Deleted

## 2017-09-04 NOTE — Telephone Encounter (Signed)
-----   Message from Mychart, Generic sent at 09/03/2017 12:50 PM EST -----    Appointment Request From: Dan EuropeAaron L Melott    With Provider: Johny SaxJeffrey Hatcher, MD [facility]]    Preferred Date Range: Any date 09/25/2017 or earlier    Preferred Times: Any time    Reason for visit: Request an Appointment    Comments:  I missed my last appointment. My schedule has changed since I started working and I forgot I had an appointment     Rn rescheduled the patient for Dr Moshe CiproHatcher's 1st available appt which was jan 7th at 10am. I replied and made the patient aware of the appt rescheduled date and offered a pharmacy clnic appt if he would like an appt before the 31st

## 2017-10-02 ENCOUNTER — Ambulatory Visit: Payer: Medicaid Other | Admitting: Infectious Diseases

## 2017-10-15 IMAGING — CT CT ABD-PELV W/ CM
2 of 4 series · 15 of 46 positions shown, 17 images · IV contrast (Omni 300)
Comparison: None.

CLINICAL DATA: A normal region bleeding and irritation. HIV
disease.

EXAM:
CT ABDOMEN AND PELVIS WITH CONTRAST
TECHNIQUE: Multidetector CT imaging of the abdomen and pelvis was performed
using the standard protocol following bolus administration of
intravenous contrast.
CONTRAST:  100mL [id number] IOPAMIDOL (DT14NZ-0ZZ) INJECTION 61%

[Series 2: a/p w/ 5mm · axial · 0.67mm/px · z∈[+981,+1356]mm · 12 of 89 slices shown, 14 images]
[im 7/89  soft-tissue]
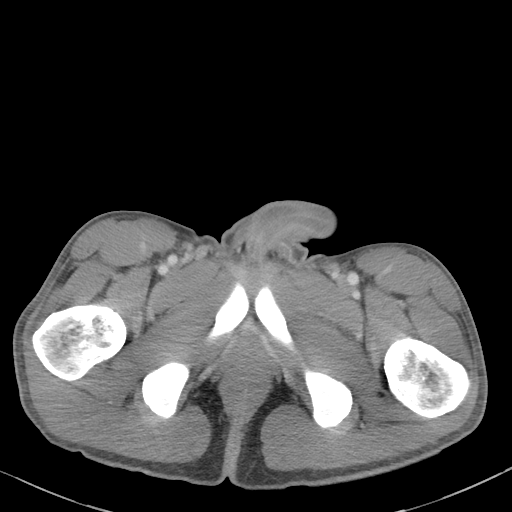
[im 7/89  bone]
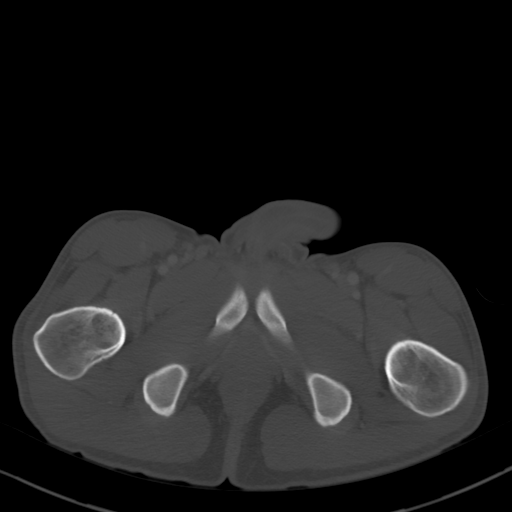
[im 14/89  soft-tissue]
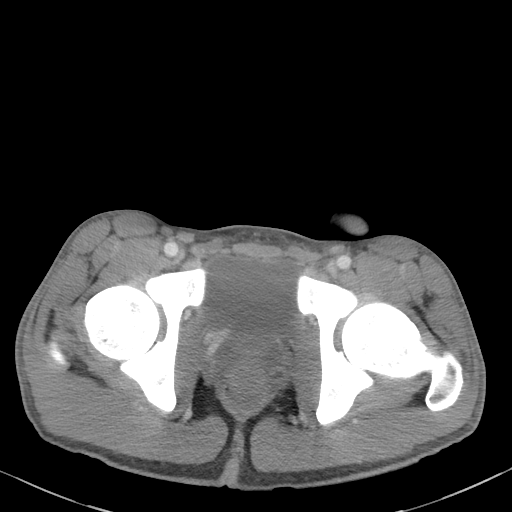
[im 20/89  soft-tissue]
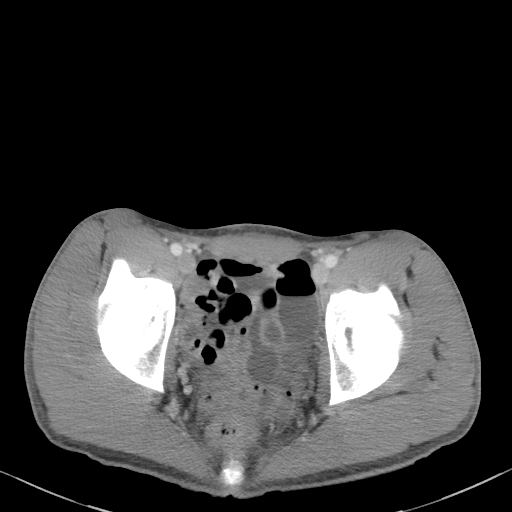
[im 27/89  soft-tissue]
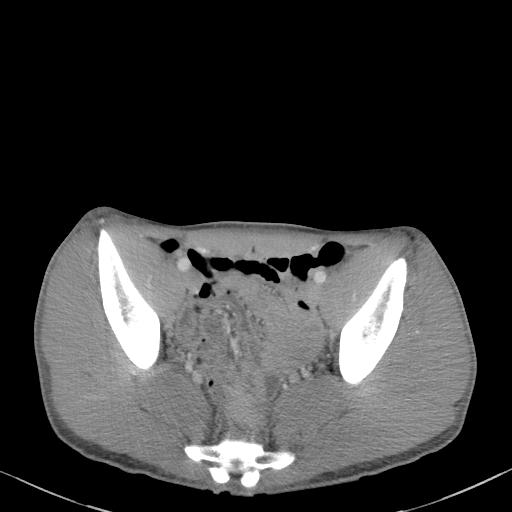
[im 33/89  soft-tissue]
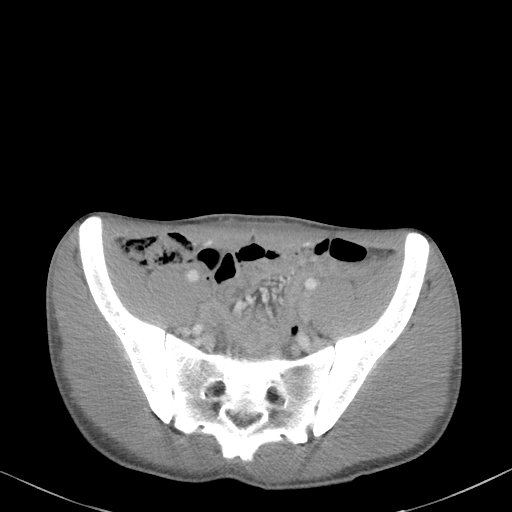
[im 40/89  soft-tissue]
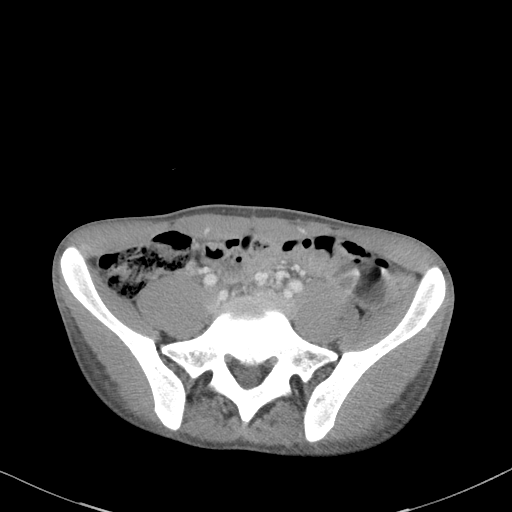
[im 49/89  soft-tissue]
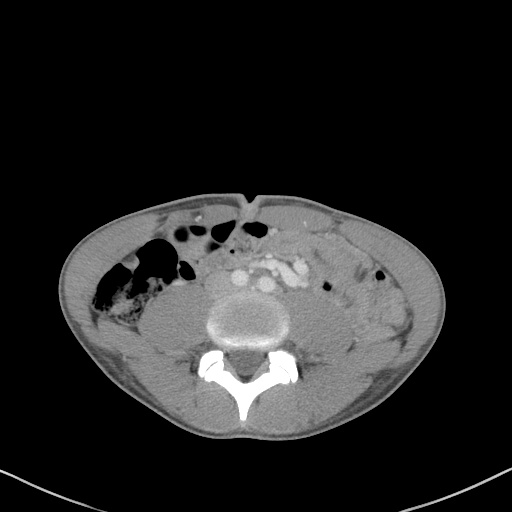
[im 56/89  soft-tissue]
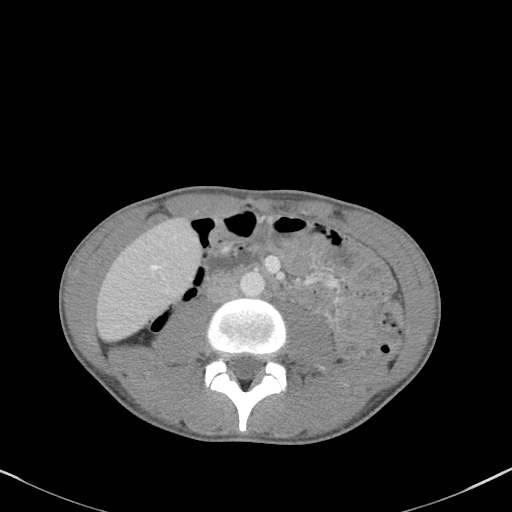
[im 62/89  soft-tissue]
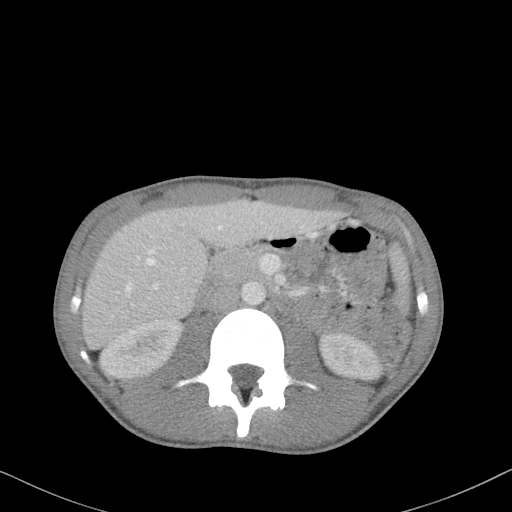
[im 62/89  bone]
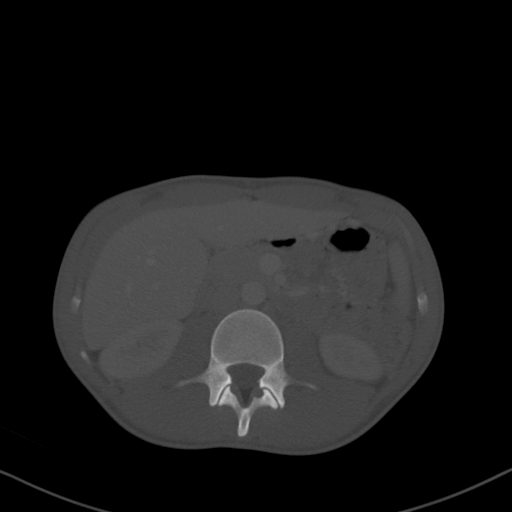
[im 69/89  soft-tissue]
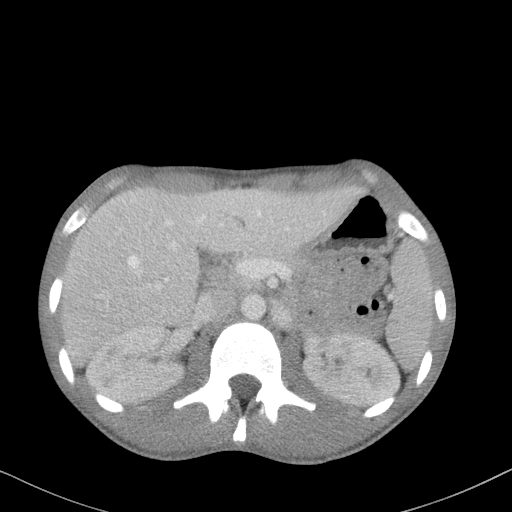
[im 75/89  soft-tissue]
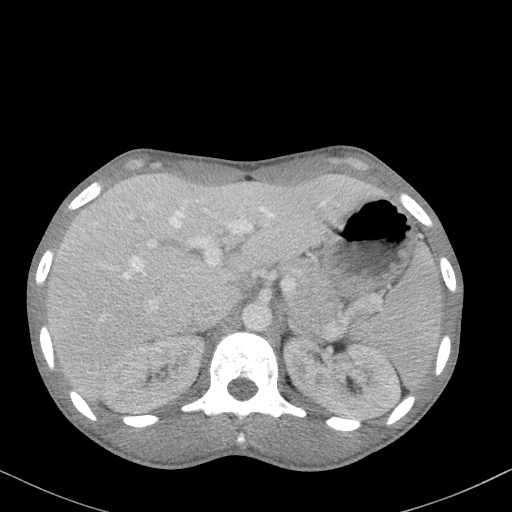
[im 82/89  soft-tissue]
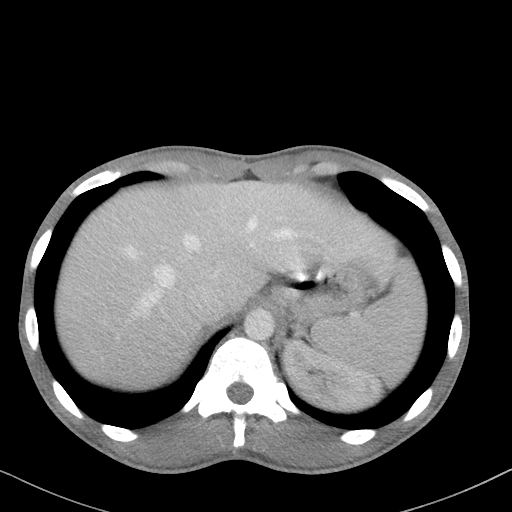

[Series 5: a/p w/ cor · coronal · 0.60mm/px · 3 of 133 slices shown]
[im 45/133  soft-tissue]
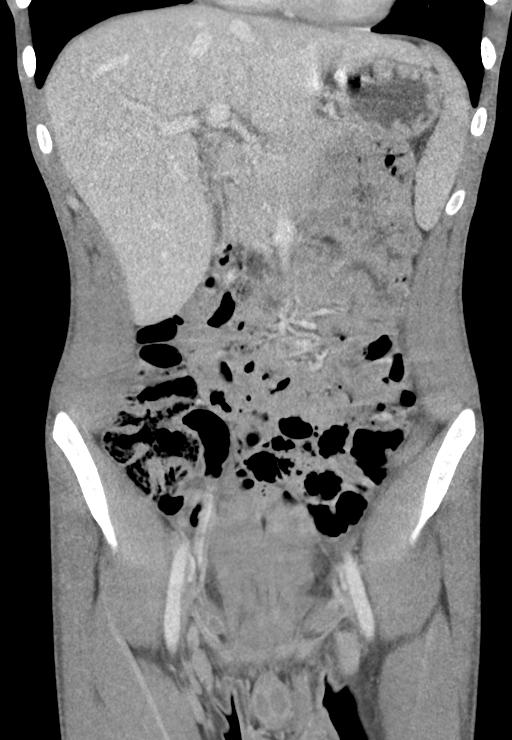
[im 59/133  soft-tissue]
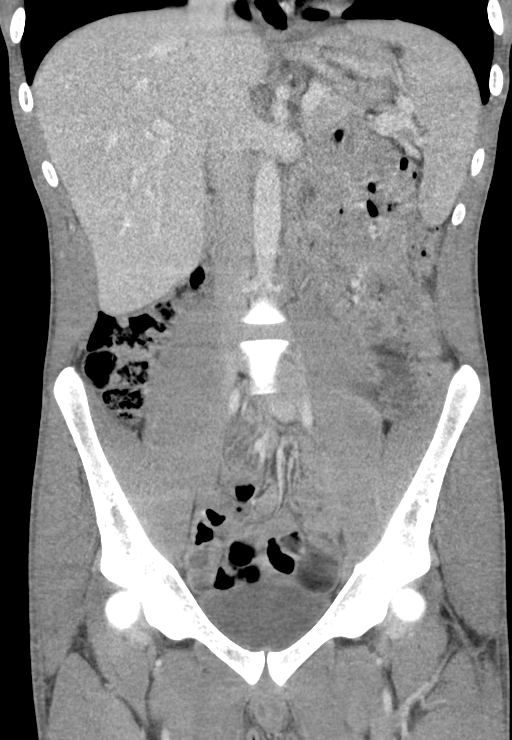
[im 74/133  soft-tissue]
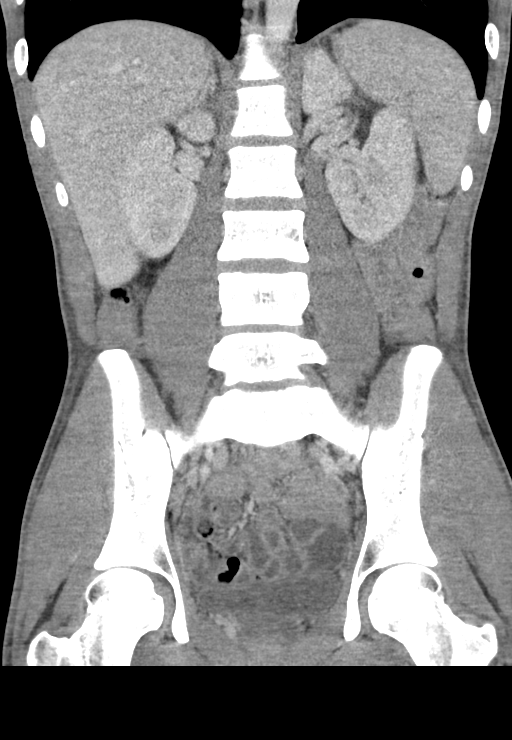

[15 of 46 positions shown; findings below may reference images not displayed]

FINDINGS: Lower chest: Lung bases are clear.

Hepatobiliary: No focal liver lesions are evident. Gallbladder is
contracted. No pericholecystic fluid evident. There is no biliary
duct dilatation.

Pancreas: There is no evident pancreatic mass or inflammatory focus.

Spleen: Spleen appears upper normal in size. No focal splenic
lesions are evident.

Adrenals/Urinary Tract: Adrenals appear unremarkable bilaterally.
There is no renal mass or hydronephrosis on either side. There is no
evident renal or ureteral calculus on either side. Urinary bladder
is midline with wall thickness within normal limits.

Stomach/Bowel: There is no appreciable bowel wall or mesenteric
thickening. In the rectal region, there is soft tissue stranding
without abscess or fluid collection. No fistula is evident in the in
the rectal region. No abnormal areas seen in this region. There is
no evident bowel obstruction. No free air or portal venous air.

Vascular/Lymphatic: No abdominal aortic aneurysm. No vascular
lesions are evident. There are several mildly prominent inguinal
lymph nodes bilaterally. The largest inguinal lymph node on the left
measures 1.7 x 1.0 cm. The largest individual inguinal lymph node on
the right measures 1.7 x 1.3 cm. No other areas of lymph node
enlargement are appreciable on this study.

Reproductive: Prostate and seminal vesicles appear unremarkable in
size and contour. No pelvic mass or pelvic fluid collection evident.

Other: Appendix appears unremarkable. There is no abscess or ascites
in the abdomen or pelvis.

Musculoskeletal: No evident blastic or lytic bone lesion. No
intramuscular or abdominal wall lesion.
IMPRESSION: Evidence a degree of proctitis without abscess or in a rectal
fistula appreciable.

No bowel wall thickening or bowel obstruction.  No free air.

Several prominent inguinal lymph nodes. No other adenopathy evident.

Spleen upper normal in size. No demonstrable abscess in the abdomen
or pelvis. Appendix region appears normal. No renal or ureteral
calculus. No hydronephrosis.

## 2017-10-15 IMAGING — CR DG CHEST 2V
2 series · 2 of 2 positions shown · non-contrast
Comparison: None.

CLINICAL DATA: Chest pain and shortness of breath

EXAM:
CHEST  2 VIEW

[chest pa]
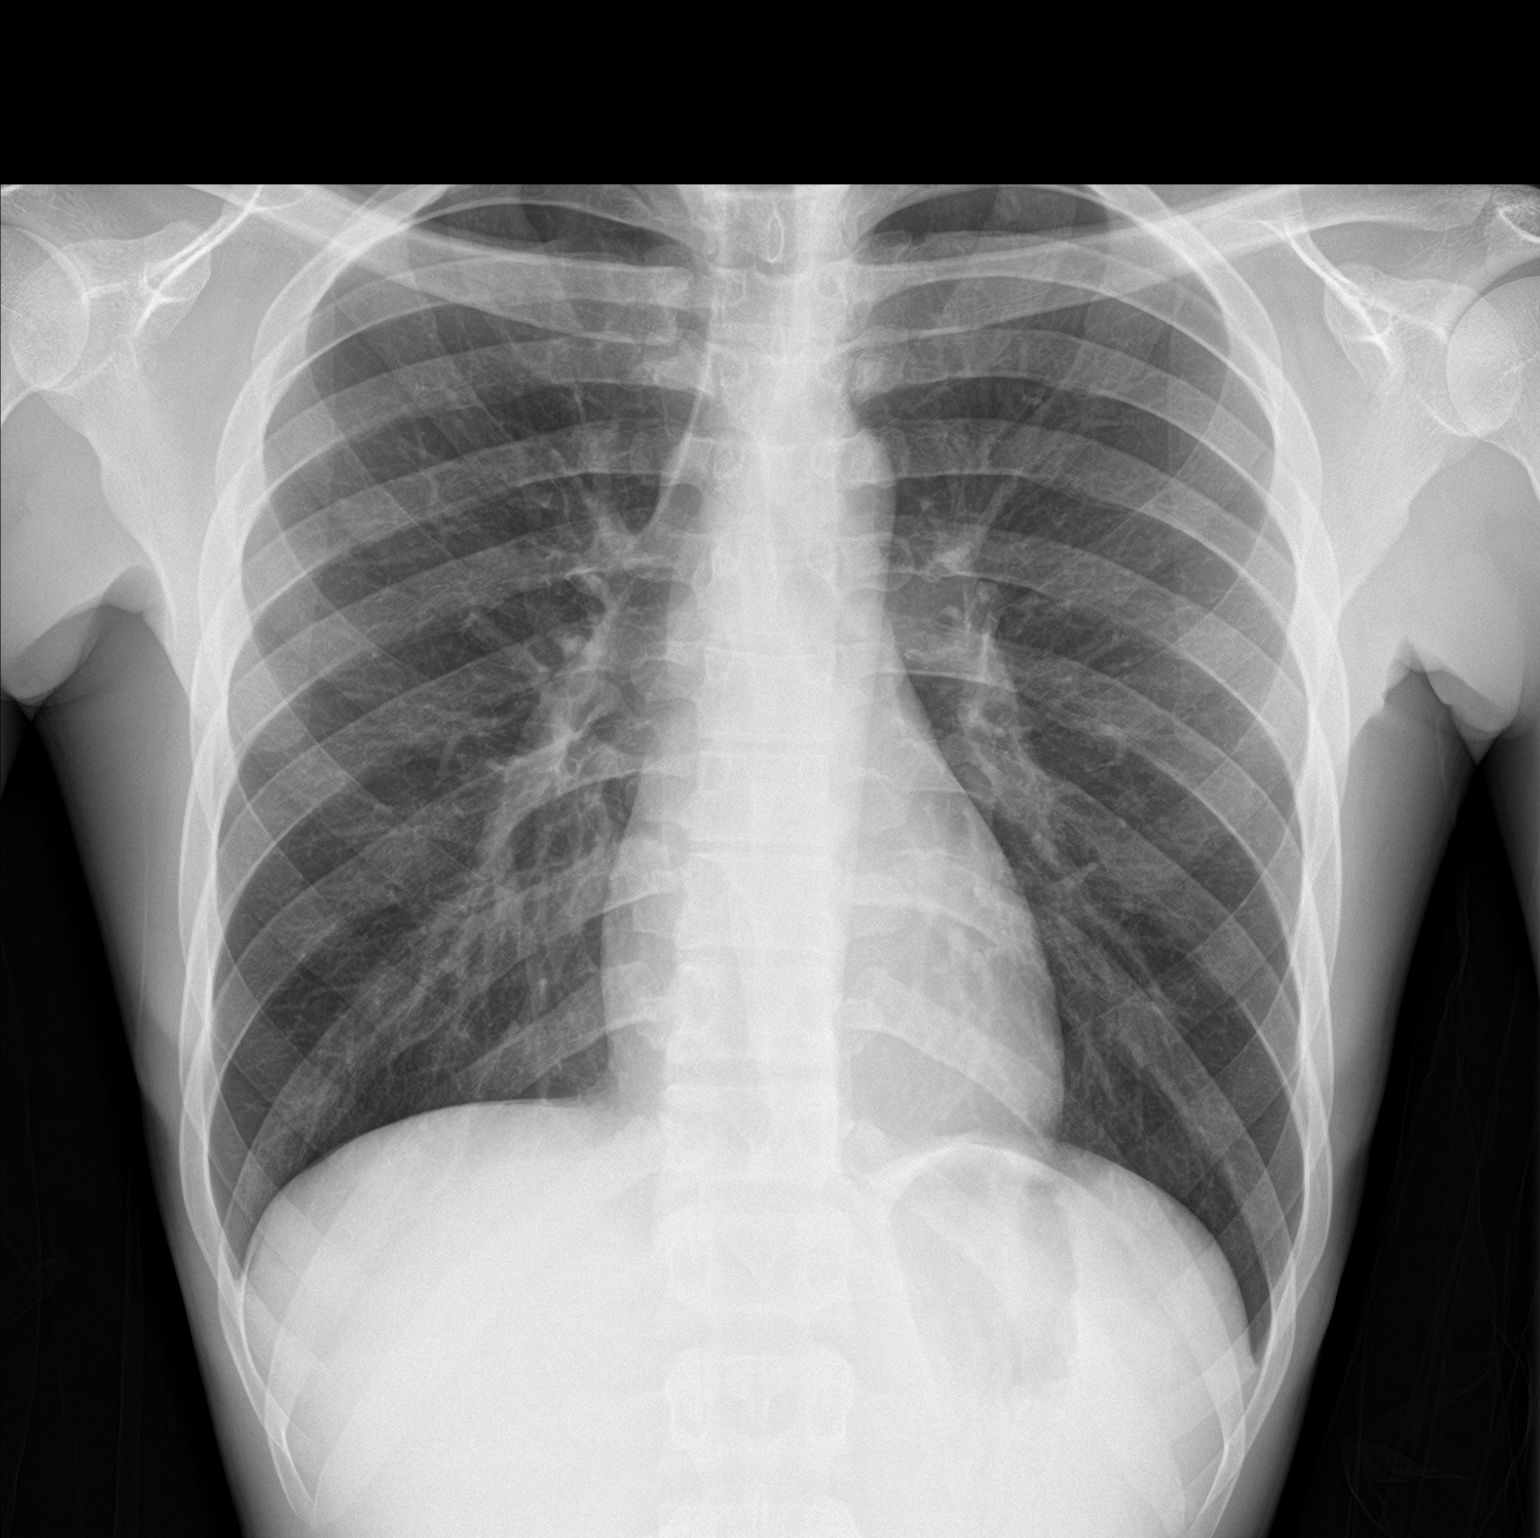

[chest lat]
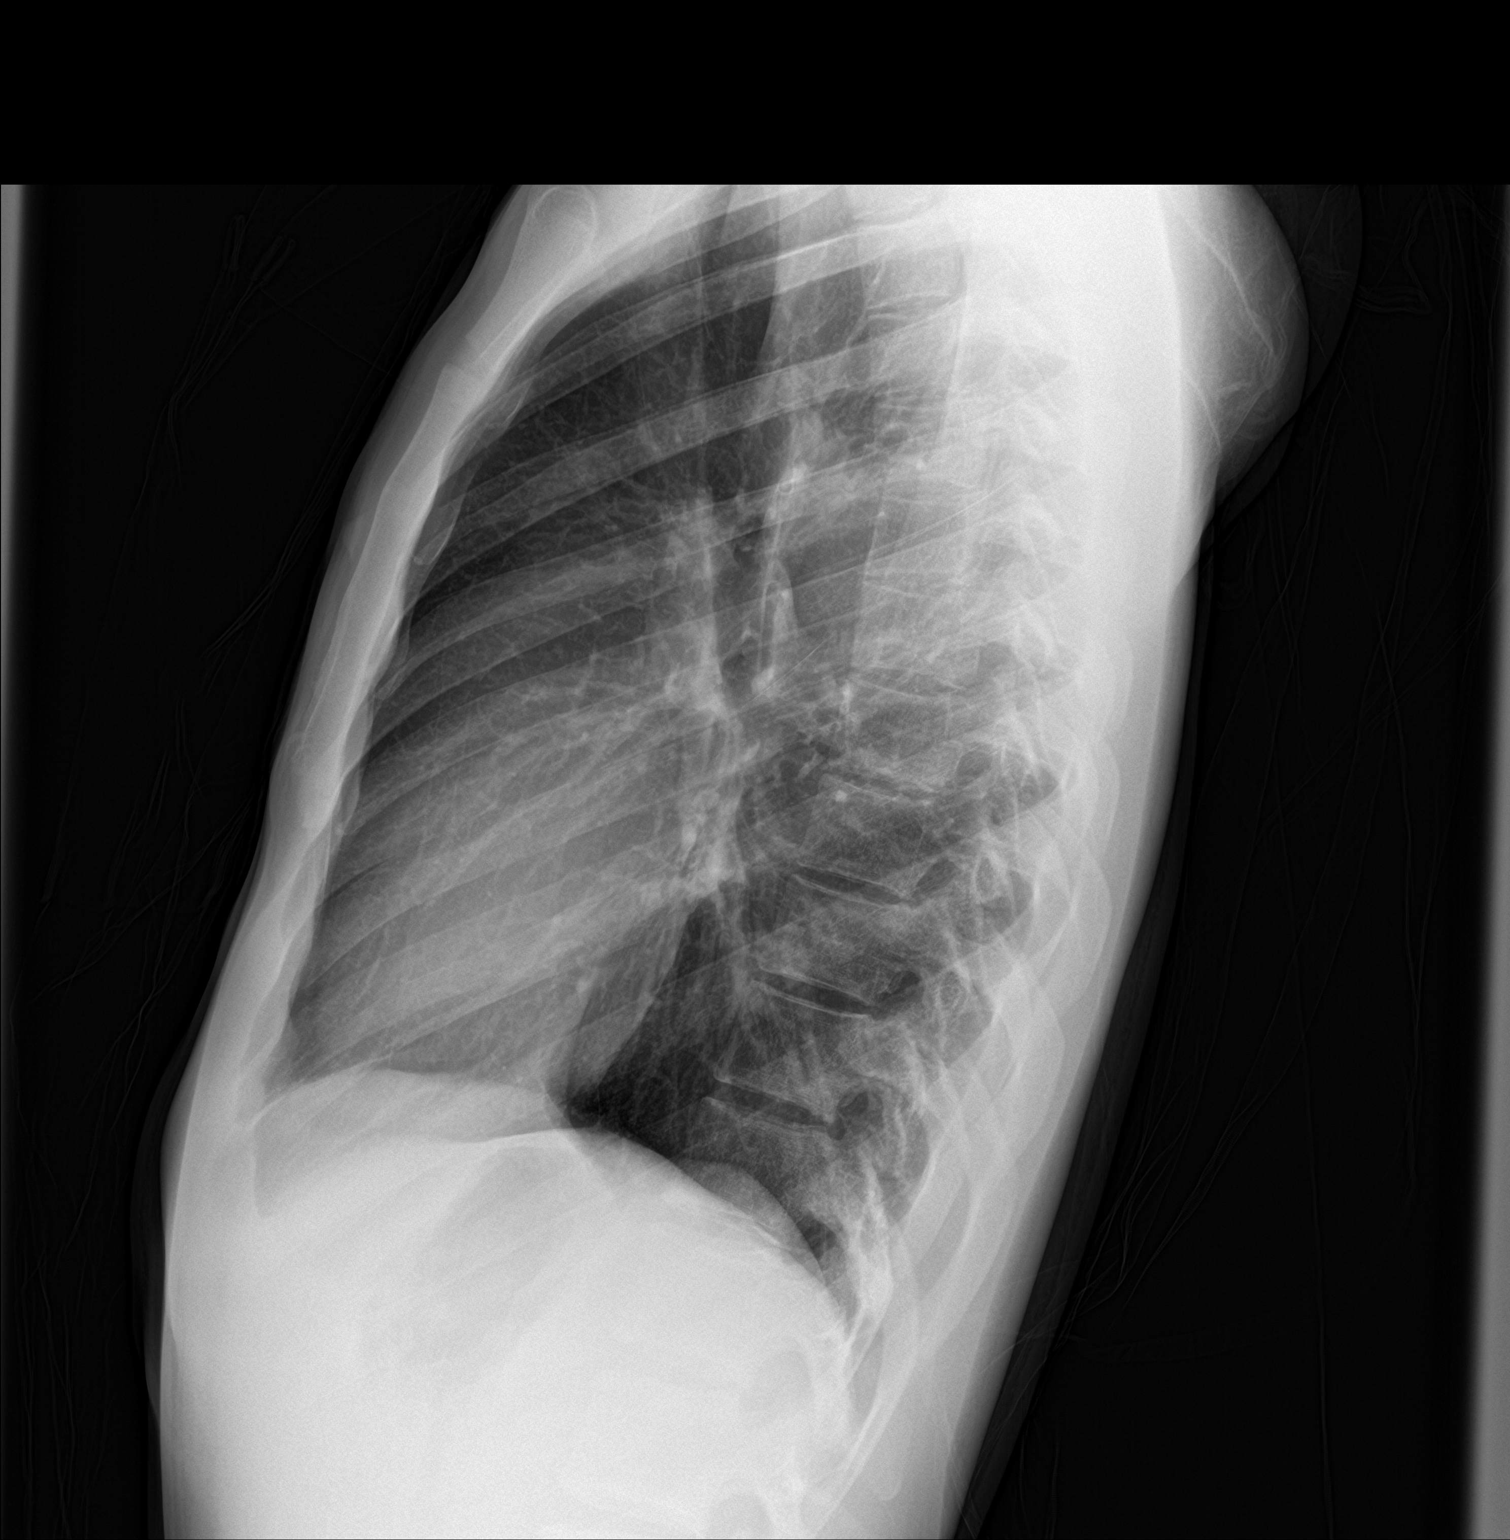

[2 of 2 positions shown; findings below may reference images not displayed]

FINDINGS: The lungs are clear. Heart size and pulmonary vascularity are
normal. No pneumothorax. No adenopathy. No bone lesions.
IMPRESSION: No edema or consolidation.

## 2018-04-26 ENCOUNTER — Emergency Department (HOSPITAL_COMMUNITY): Payer: Medicaid Other

## 2018-04-26 ENCOUNTER — Other Ambulatory Visit: Payer: Self-pay

## 2018-04-26 ENCOUNTER — Emergency Department (HOSPITAL_COMMUNITY)
Admission: EM | Admit: 2018-04-26 | Discharge: 2018-04-27 | Disposition: A | Discharge status: Home / Self Care | Payer: Medicaid Other | Attending: Emergency Medicine | Admitting: Emergency Medicine

## 2018-04-26 DIAGNOSIS — N179 Acute kidney failure, unspecified: Secondary | ICD-10-CM | POA: Diagnosis not present

## 2018-04-26 DIAGNOSIS — Y999 Unspecified external cause status: Secondary | ICD-10-CM | POA: Diagnosis not present

## 2018-04-26 DIAGNOSIS — Y939 Activity, unspecified: Secondary | ICD-10-CM | POA: Insufficient documentation

## 2018-04-26 DIAGNOSIS — Z79899 Other long term (current) drug therapy: Secondary | ICD-10-CM | POA: Insufficient documentation

## 2018-04-26 DIAGNOSIS — B20 Human immunodeficiency virus [HIV] disease: Secondary | ICD-10-CM | POA: Diagnosis not present

## 2018-04-26 DIAGNOSIS — S0501XA Injury of conjunctiva and corneal abrasion without foreign body, right eye, initial encounter: Secondary | ICD-10-CM | POA: Diagnosis not present

## 2018-04-26 DIAGNOSIS — Y929 Unspecified place or not applicable: Secondary | ICD-10-CM | POA: Diagnosis not present

## 2018-04-26 DIAGNOSIS — F17210 Nicotine dependence, cigarettes, uncomplicated: Secondary | ICD-10-CM | POA: Diagnosis not present

## 2018-04-26 DIAGNOSIS — S0591XA Unspecified injury of right eye and orbit, initial encounter: Secondary | ICD-10-CM | POA: Diagnosis present

## 2018-04-26 LAB — BASIC METABOLIC PANEL
Anion gap: 7 (ref 5–15)
BUN: 19 mg/dL (ref 6–20)
CO2: 29 mmol/L (ref 22–32)
Calcium: 9.5 mg/dL (ref 8.9–10.3)
Chloride: 105 mmol/L (ref 98–111)
Creatinine, Ser: 1.18 mg/dL (ref 0.61–1.24)
GFR calc Af Amer: >60 mL/min (ref >60)
GFR calc non Af Amer: >60 mL/min (ref >60)
Glucose, Bld: 87 mg/dL (ref 70–99)
Potassium: 3.9 mmol/L (ref 3.5–5.1)
Sodium: 141 mmol/L (ref 135–145)

## 2018-04-26 LAB — CBC WITH DIFFERENTIAL/PLATELET
Basophils Absolute: 0 10*3/uL (ref 0.0–0.1)
Basophils Relative: 0 %
Eosinophils Absolute: 0.1 10*3/uL (ref 0.0–0.7)
Eosinophils Relative: 3 %
HCT: 30.3 % — ABNORMAL LOW (ref 39.0–52.0)
Hemoglobin: 10.1 g/dL — ABNORMAL LOW (ref 13.0–17.0)
Lymphocytes Relative: 9 %
Lymphs Abs: 0.4 10*3/uL — ABNORMAL LOW (ref 0.7–4.0)
MCH: 30.1 pg (ref 26.0–34.0)
MCHC: 33.3 g/dL (ref 30.0–36.0)
MCV: 90.4 fL (ref 78.0–100.0)
Monocytes Absolute: 0.4 10*3/uL (ref 0.1–1.0)
Monocytes Relative: 10 %
Neutro Abs: 3.2 10*3/uL (ref 1.7–7.7)
Neutrophils Relative %: 78 %
Platelets: 165 10*3/uL (ref 150–400)
RBC: 3.35 MIL/uL — ABNORMAL LOW (ref 4.22–5.81)
RDW: 12.5 % (ref 11.5–15.5)
WBC: 4.1 10*3/uL (ref 4.0–10.5)

## 2018-04-26 MED ORDER — ACETAMINOPHEN 500 MG PO TABS
1000.0000 mg | ORAL_TABLET | Freq: Once | ORAL | Status: AC
Start: 2018-04-26 — End: 2018-04-26
  Administered 2018-04-26: 1000 mg via ORAL
  Filled 2018-04-26: qty 2

## 2018-04-26 MED ORDER — FLUORESCEIN SODIUM 1 MG OP STRP
2.0000 | ORAL_STRIP | Freq: Once | OPHTHALMIC | Status: AC
  Administered 2018-04-26: 2 via OPHTHALMIC
  Filled 2018-04-26: qty 2

## 2018-04-26 MED ORDER — ONDANSETRON 4 MG PO TBDP
4.0000 mg | ORAL_TABLET | Freq: Once | ORAL | Status: AC
  Administered 2018-04-26: 4 mg via ORAL
  Filled 2018-04-26: qty 1

## 2018-04-26 MED ORDER — TETRACAINE HCL 0.5 % OP SOLN
2.0000 [drp] | Freq: Once | OPHTHALMIC | Status: AC
  Administered 2018-04-26: 2 [drp] via OPHTHALMIC
  Filled 2018-04-26: qty 4

## 2018-04-26 MED ORDER — ERYTHROMYCIN 5 MG/GM OP OINT
TOPICAL_OINTMENT | Freq: Three times a day (TID) | OPHTHALMIC | Status: DC
  Administered 2018-04-26: 1 via OPHTHALMIC
  Filled 2018-04-26: qty 3.5

## 2018-04-26 NOTE — Discharge Instructions (Addendum)
You have a corneal abrasion, use antibiotic ointment as prescribed.  You need to follow up with ophthalmology TOMORROW AT 8:30 AM at Dr. Cathey EndowBowen office. Do not miss this appointment. You have a 50% chance that there is significant eye trauma.  Wear sun glasses to help with sun sensitivity. Return to ER for loss of vision, severe headache, bleeding or pus from the eye.   [facility] and ask for Tomasita Morrowammy King (case manager at the clinic) to discuss your options of joining the ADAP program which can help afford your medications. You viral load and Cd4 count numbers are pending and should be finalized in 24-48 hours.
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
## 2018-04-27 LAB — T-HELPER CELLS (CD4) COUNT (NOT AT ARMC)
CD4 % Helper T Cell: 4 % — ABNORMAL LOW (ref 33–55)
CD4 T Cell Abs: 20 /uL — ABNORMAL LOW (ref 400–2700)

## 2018-04-27 LAB — RPR: RPR Ser Ql: NONREACTIVE

## 2018-04-28 LAB — HIV-1 RNA QUANT-NO REFLEX-BLD
HIV 1 RNA Quant: 248000 copies/mL
LOG10 HIV-1 RNA: 5.394 log10copy/mL

## 2018-05-01 ENCOUNTER — Encounter: Payer: Self-pay | Admitting: Infectious Diseases

## 2018-05-04 ENCOUNTER — Ambulatory Visit: Payer: Medicaid Other

## 2018-05-24 ENCOUNTER — Ambulatory Visit: Payer: Medicaid Other

## 2018-07-02 NOTE — Telephone Encounter (Signed)
Patient is transferring care to [facility]. [facility] notified. Andree Coss, RN

## 2019-01-08 ENCOUNTER — Telehealth: Payer: Self-pay | Admitting: Infectious Diseases

## 2019-01-08 NOTE — Telephone Encounter (Signed)
COVID-19 Pre-Screening Questions:4/14 ° °Do you currently have a fever (>100 °F), chills or unexplained body aches?NO ° °Are you currently experiencing new cough, shortness of breath, sore throat, runny nose? NO °•  °Have you recently travelled outside the state of [place] in the last 14 days?NO °•  °Have you been in contact with someone that is currently pending confirmation of Covid19 testing or has been confirmed to have the Covid19 virus?  NO ° °**If the patient answers NO to ALL questions -  advise the patient to please call the clinic before coming to the office should any symptoms develop.  ° ° °

## 2019-01-09 ENCOUNTER — Other Ambulatory Visit: Payer: Medicaid Other

## 2019-01-09 ENCOUNTER — Other Ambulatory Visit: Payer: Self-pay

## 2019-01-09 DIAGNOSIS — B20 Human immunodeficiency virus [HIV] disease: Secondary | ICD-10-CM

## 2019-01-09 DIAGNOSIS — Z113 Encounter for screening for infections with a predominantly sexual mode of transmission: Secondary | ICD-10-CM

## 2019-01-09 DIAGNOSIS — Z79899 Other long term (current) drug therapy: Secondary | ICD-10-CM

## 2019-01-21 ENCOUNTER — Other Ambulatory Visit: Payer: Self-pay

## 2019-01-21 ENCOUNTER — Encounter: Payer: Self-pay | Admitting: Infectious Diseases

## 2019-01-21 DIAGNOSIS — Z113 Encounter for screening for infections with a predominantly sexual mode of transmission: Secondary | ICD-10-CM

## 2019-01-21 DIAGNOSIS — B20 Human immunodeficiency virus [HIV] disease: Secondary | ICD-10-CM

## 2019-01-21 DIAGNOSIS — Z79899 Other long term (current) drug therapy: Secondary | ICD-10-CM

## 2019-01-22 LAB — T-HELPER CELLS (CD4) COUNT (NOT AT ARMC)
CD4 % Helper T Cell: 4 % — ABNORMAL LOW (ref 33–55)
CD4 T Cell Abs: 33 /uL — ABNORMAL LOW (ref 400–2700)

## 2019-01-22 LAB — URINE CYTOLOGY ANCILLARY ONLY
Chlamydia: NEGATIVE
Neisseria Gonorrhea: NEGATIVE

## 2019-01-24 LAB — CBC WITH DIFFERENTIAL/PLATELET
Absolute Monocytes: 340 cells/uL (ref 200–950)
Basophils Absolute: 29 cells/uL (ref 0–200)
Basophils Relative: 0.7 %
Eosinophils Absolute: 160 cells/uL (ref 15–500)
Eosinophils Relative: 3.8 %
HCT: 38.5 % (ref 38.5–50.0)
Hemoglobin: 13.2 g/dL (ref 13.2–17.1)
Lymphs Abs: 1008 cells/uL (ref 850–3900)
MCH: 31.4 pg (ref 27.0–33.0)
MCHC: 34.3 g/dL (ref 32.0–36.0)
MCV: 91.7 fL (ref 80.0–100.0)
MPV: 10.6 fL (ref 7.5–12.5)
Monocytes Relative: 8.1 %
Neutro Abs: 2663 cells/uL (ref 1500–7800)
Neutrophils Relative %: 63.4 %
Platelets: 218 10*3/uL (ref 140–400)
RBC: 4.2 10*6/uL (ref 4.20–5.80)
RDW: 13 % (ref 11.0–15.0)
Total Lymphocyte: 24 %
WBC: 4.2 10*3/uL (ref 3.8–10.8)

## 2019-01-24 LAB — LIPID PANEL
Cholesterol: 162 mg/dL (ref <200)
HDL: 27 mg/dL — ABNORMAL LOW (ref >=40)
LDL Cholesterol (Calc): 118 mg/dL (calc) — ABNORMAL HIGH
Non-HDL Cholesterol (Calc): 135 mg/dL (calc) — ABNORMAL HIGH (ref <130)
Total CHOL/HDL Ratio: 6 (calc) — ABNORMAL HIGH (ref <5.0)
Triglycerides: 73 mg/dL (ref <150)

## 2019-01-24 LAB — COMPREHENSIVE METABOLIC PANEL
AG Ratio: 0.8 (calc) — ABNORMAL LOW (ref 1.0–2.5)
ALT: 10 U/L (ref 9–46)
AST: 17 U/L (ref 10–40)
Albumin: 4.4 g/dL (ref 3.6–5.1)
Alkaline phosphatase (APISO): 75 U/L (ref 36–130)
BUN: 11 mg/dL (ref 7–25)
CO2: 28 mmol/L (ref 20–32)
Calcium: 9.4 mg/dL (ref 8.6–10.3)
Chloride: 102 mmol/L (ref 98–110)
Creat: 1.01 mg/dL (ref 0.60–1.35)
Globulin: 5.5 g/dL (calc) — ABNORMAL HIGH (ref 1.9–3.7)
Glucose, Bld: 95 mg/dL (ref 65–99)
Potassium: 3.3 mmol/L — ABNORMAL LOW (ref 3.5–5.3)
Sodium: 138 mmol/L (ref 135–146)
Total Bilirubin: 0.5 mg/dL (ref 0.2–1.2)
Total Protein: 9.9 g/dL — ABNORMAL HIGH (ref 6.1–8.1)

## 2019-01-24 LAB — HIV-1 RNA QUANT-NO REFLEX-BLD
HIV 1 RNA Quant: 303000 copies/mL — ABNORMAL HIGH
HIV-1 RNA Quant, Log: 5.48 Log copies/mL — ABNORMAL HIGH

## 2019-01-24 LAB — RPR: RPR Ser Ql: NONREACTIVE

## 2019-01-25 ENCOUNTER — Encounter: Payer: Self-pay | Admitting: Infectious Diseases

## 2019-02-05 ENCOUNTER — Other Ambulatory Visit: Payer: Self-pay

## 2019-02-05 ENCOUNTER — Telehealth: Payer: Self-pay | Admitting: Pharmacy Technician

## 2019-02-05 ENCOUNTER — Ambulatory Visit (INDEPENDENT_AMBULATORY_CARE_PROVIDER_SITE_OTHER): Payer: Medicare Other | Admitting: Infectious Diseases

## 2019-02-05 ENCOUNTER — Encounter: Payer: Self-pay | Admitting: Infectious Diseases

## 2019-02-05 VITALS — BP 133/95 | HR 97 | Temp 98.6°F | Ht 79.0 in | Wt 163.0 lb

## 2019-02-05 DIAGNOSIS — B20 Human immunodeficiency virus [HIV] disease: Secondary | ICD-10-CM

## 2019-02-05 DIAGNOSIS — Z9119 Patient's noncompliance with other medical treatment and regimen: Secondary | ICD-10-CM | POA: Diagnosis not present

## 2019-02-05 DIAGNOSIS — Z23 Encounter for immunization: Secondary | ICD-10-CM | POA: Diagnosis not present

## 2019-02-05 DIAGNOSIS — Z91199 Patient's noncompliance with other medical treatment and regimen due to unspecified reason: Secondary | ICD-10-CM

## 2019-02-05 MED ORDER — BICTEGRAVIR-EMTRICITAB-TENOFOV 50-200-25 MG PO TABS: 1.0000 | ORAL_TABLET | Freq: Every day | ORAL | 1 refills | Status: DC

## 2019-02-05 NOTE — Telephone Encounter (Addendum)
[facility] Patient Product/process development scientist completed.    The patient is uninsured but has ADAP for medication needs.  Netty Starring. Dimas Aguas CPhT [facility] Patient [facility] Phone: [phone] Fax:  [phone]

## 2019-02-05 NOTE — Progress Notes (Signed)
[name] [name]  [id number]  Dec 26, 1995    HPI: The patient is a 23 y.o. y/o AA male to male pre-op transgendered patient presenting today to re-establish care for HIV infection. She is transferring her care from clinic [facility]. She was last seen there in 06/2018 by Dr. Otilio Miu. She was listed as taking biktarvy as her ARV regimen at that time. Her most recent CD4 count was 33 and concurrent HIV viral load was 303,000 copies on no ARVs for several months. Her CD4 nadir is 33. Her HIV was diagnosed in 2013. Her past ARV experience includes complera and biktarvy, . Prior changes in ARVs were/were not due to the development of ARV resistance. The patient has no known ARV mutations. Review of prior HIV records does not indicate good compliance with ARV medications. She is without complaints today.   Past Medical History:  Diagnosis Date  . Acute kidney failure ([place]) 04/2017  . Anal fissure 2017  . C. difficile colitis 10/2016  . Candida esophagitis ([place]) 2017  . Cellulitis of hand 07/2016  . Chlamydia    multiple episodes  . 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 Genital HSV 2017  . Hepatitis B immune   . HIV disease ([place])    dx'ed in 2013  . Immune deficiency disorder ([place])   . 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 Rectal gonorrhea    multiple episodes  . Seizures ([place]) 2017   "probably stress related" (05/24/2017)  . Syphilis   . Syphilis, secondary 11/2011   tx'ed with IM PCN x 3    Past Surgical History:  Procedure Laterality Date  . ANAL EXAMINATION UNDER ANESTHESIA    . INCISION AND DRAINAGE ABSCESS Left 02/16/2019   Procedure: INCISION AND DRAINAGE ABSCESS;  Surgeon: Melida Quitter, MD;  Location: [place];  Service: ENT;  Laterality: Left;  . WISDOM TOOTH EXTRACTION       Family History  Problem Relation Age of Onset  . Hypertension Maternal Grandmother      Social History   Tobacco Use  . Smoking status: Current  Every Day Smoker    Packs/day: 0.50    Years: 6.00    Pack years: 3.00    Types: Cigarettes  . Smokeless tobacco: Never Used  Substance Use Topics  . Alcohol use: Yes    Comment: slim to none  . Drug use: Yes    Types: Marijuana      reports being sexually active. He reports using the following method of birth control/protection: Condom.   Outpatient Medications Prior to Visit  Medication Sig Dispense Refill  . bictegravir-emtricitabine-tenofovir AF (BIKTARVY) 50-200-25 MG TABS tablet Take 1 tablet by mouth daily.    Marland Kitchen emtricitabine-tenofovir AF (DESCOVY) 200-25 MG tablet Take 1 tablet by mouth daily. 30 tablet 5  . ibuprofen (ADVIL,MOTRIN) 200 MG tablet Take 200 mg by mouth every 6 (six) hours as needed for moderate pain.    Marland Kitchen PREZCOBIX 800-150 MG tablet TAKE 1 TABLET BY MOUTH DAILY WITH BREAKFAST. SWALLOW WHOLE, DO NOT CRUSH, BREAK, OR CHEW TABLETS 30 tablet 4  . hydrOXYzine (ATARAX/VISTARIL) 10 MG tablet Take 1 tablet (10 mg total) by mouth 3 (three) times daily as needed. (Patient not taking: Reported on 05/24/2017) 30 tablet 0  . levETIRAcetam (KEPPRA) 500 MG tablet Take 1 tablet (500 mg total) by mouth 2 (two) times daily. (Patient not taking: Reported on 04/26/2018) 180 tablet 3  . ondansetron (ZOFRAN) 4 MG tablet Take  1 tablet (4 mg total) by mouth every 6 (six) hours as needed for nausea. (Patient not taking: Reported on 04/26/2018) 20 tablet 0   No facility-administered medications prior to visit.      No Known Allergies   Review of Systems  Constitutional: Positive for fatigue. Negative for chills and fever.  HENT: Negative for congestion, hearing loss, rhinorrhea and sinus pressure.   Eyes: Negative for photophobia, pain, redness and visual disturbance.  Respiratory: Negative for apnea, cough, shortness of breath and wheezing.   Cardiovascular: Negative for chest pain and palpitations.  Gastrointestinal: Negative for abdominal pain, constipation, diarrhea, nausea and  vomiting.  Endocrine: Negative for cold intolerance, heat intolerance, polydipsia and polyuria.  Genitourinary: Negative for decreased urine volume, dysuria, frequency, hematuria and testicular pain.  Musculoskeletal: Negative for back pain, myalgias and neck pain.  Skin: Negative for pallor and rash.  Allergic/Immunologic: Positive for immunocompromised state.  Neurological: Negative for dizziness, seizures, syncope, speech difficulty and light-headedness.  Hematological: Does not bruise/bleed easily.  Psychiatric/Behavioral: Negative for agitation and hallucinations. The patient is nervous/anxious.     Vitals:   02/05/19 1530  BP: (!) 133/95  Pulse: 97  Temp: 98.6 F (37 C)   Physical Exam Gen: anxious, NAD, A&Ox 3 Head: NCAT, no temporal wasting evident EENT: PERRL, EOMI, MMM, adequate dentition Neck: supple, no JVD CV: NRRR, no murmurs evident Pulm: CTA bilaterally, no wheeze or retractions Abd: soft, NTND, +BS Extrems: trace LE edema, 2+ pulses Skin: no rashes, adequate skin turgor Neuro: CN II-XII grossly intact, no focal neurologic deficits appreciated, gait was not assessed, A&Ox 3   Labs:  Lab Results  Component Value Date   CD4TCELL 4 (L) 01/21/2019   CD4TABS 33 (L) 01/21/2019     Lab Results  Component Value Date   WBC 4.0 02/12/2019   HGB 11.6 (L) 02/12/2019   HCT 35.5 (L) 02/12/2019   MCV 96.2 02/12/2019   PLT 238 02/12/2019       Chemistry      Component Value Date/Time   NA 139 02/12/2019 1922   K 3.2 (L) 02/12/2019 1922   CL 106 02/12/2019 1922   CO2 25 02/12/2019 1922   BUN 15 02/12/2019 1922   CREATININE 0.90 02/12/2019 1922   CREATININE 1.01 01/21/2019 1135      Component Value Date/Time   CALCIUM 8.5 (L) 02/12/2019 1922   ALKPHOS 60 02/12/2019 1922   AST 13 (L) 02/12/2019 1922   ALT 10 02/12/2019 1922   BILITOT 0.5 02/12/2019 1922        Assessment/Plan: The patient is a 23 year old African-American male to male transgender  individual with genital HSV, ?seizure d/o, multiple STIs, and history of medical noncompliance presenting for HIV care.  HIV/AIDS -patient has a longstanding history of medical noncompliance with antiretroviral treatment over the last 5+ years since antiretrovirals were attempted to be initiated.  Patient has multiple reasons for this, but appears to revolve around significant amount of denial regarding her diagnosis and gender identity.  Her CD4 count has now declined to a nadir of 33 while her HIV viral load has steadily increased now to 303,000 copies while on no antiretrovirals for the past 8 to 9 months.  Patient did briefly take Biktarvy and tolerated this well, so will restart Biktarvy 1 tab daily as baseline genotype here does not show new ARV mutations.  Our pharmacy staff in the clinic met with the patient to assist me in emphasizing the importance of 100% compliance with medications,  particularly now that the patient has severe AIDS and is at risk for multiple opportunistic infections if immune recovery cannot be achieved.  The patient expressed understanding and showed an interest in improving on prior efforts for medical compliance.  Nuances of HIV care including adherence with labs, office visits, and medications reviewed in detail.  Will repeat the patient's HIV viral load in 6 weeks and see her back at that time for reevaluation.  Genital HSV -patient denies any acute flares the present time.  Due to the patient's desire for medical simplicity, will defer adding Valtrex at this time unless the patient develops further flares, moving forward.  Health maintenance -Bactrim double strength 1 tab daily will be started for PJP/toxoplasma prophylaxis.  We will also draw AFB blood culture to rule out disseminated MAC given patient CD4 count of less than 50 and simultaneously start azithromycin 1 g p.o. weekly for MAC prophylaxis. Pneumococcal vaccine series UTD with prevnar given in 2013 and pneumovax  given in 2017. Tdap vaccine next due in 2024. RPR and urine GC/chlamydia screens all negative in 12/2018. Vaccination for hepatitis A & B initiated today. Annual TB screening with quantiferon was negative in 12/2018. He is up to date with his annual flu vaccine given in 10/19. Check FLP next in 12/2019 for annual cholesterol screening (pt reminded to be fasting at that time). HPV vaccine series completed in 2014t. Pt has been referred for annual dental cleaning once his HIV viremia is fully suppressed. Condoms and water based lubricants were advised with all sexual encounters.

## 2019-02-05 NOTE — Telephone Encounter (Signed)
Patient has ADAP and will NOT need medication assistance for HIV medication.

## 2019-02-09 ENCOUNTER — Emergency Department (HOSPITAL_COMMUNITY)
Admission: EM | Admit: 2019-02-09 | Discharge: 2019-02-09 | Disposition: A | Discharge status: Home / Self Care | Payer: Medicare Other | Attending: Emergency Medicine | Admitting: Emergency Medicine

## 2019-02-09 ENCOUNTER — Encounter (HOSPITAL_COMMUNITY): Payer: Self-pay | Admitting: Emergency Medicine

## 2019-02-09 ENCOUNTER — Other Ambulatory Visit: Payer: Self-pay

## 2019-02-09 ENCOUNTER — Emergency Department (HOSPITAL_COMMUNITY): Payer: Medicare Other

## 2019-02-09 DIAGNOSIS — Z79899 Other long term (current) drug therapy: Secondary | ICD-10-CM | POA: Insufficient documentation

## 2019-02-09 DIAGNOSIS — F17210 Nicotine dependence, cigarettes, uncomplicated: Secondary | ICD-10-CM | POA: Insufficient documentation

## 2019-02-09 DIAGNOSIS — R6884 Jaw pain: Secondary | ICD-10-CM | POA: Diagnosis present

## 2019-02-09 DIAGNOSIS — K113 Abscess of salivary gland: Secondary | ICD-10-CM | POA: Diagnosis not present

## 2019-02-09 DIAGNOSIS — B20 Human immunodeficiency virus [HIV] disease: Secondary | ICD-10-CM | POA: Insufficient documentation

## 2019-02-09 DIAGNOSIS — M542 Cervicalgia: Secondary | ICD-10-CM | POA: Diagnosis not present

## 2019-02-09 LAB — CBC WITH DIFFERENTIAL/PLATELET
Abs Immature Granulocytes: 0.02 10*3/uL (ref 0.00–0.07)
Basophils Absolute: 0 10*3/uL (ref 0.0–0.1)
Basophils Relative: 0 %
Eosinophils Absolute: 0.6 10*3/uL — ABNORMAL HIGH (ref 0.0–0.5)
Eosinophils Relative: 10 %
HCT: 38.6 % — ABNORMAL LOW (ref 39.0–52.0)
Hemoglobin: 12.5 g/dL — ABNORMAL LOW (ref 13.0–17.0)
Immature Granulocytes: 0 %
Lymphocytes Relative: 20 %
Lymphs Abs: 1.2 10*3/uL (ref 0.7–4.0)
MCH: 31.5 pg (ref 26.0–34.0)
MCHC: 32.4 g/dL (ref 30.0–36.0)
MCV: 97.2 fL (ref 80.0–100.0)
Monocytes Absolute: 0.4 10*3/uL (ref 0.1–1.0)
Monocytes Relative: 7 %
Neutro Abs: 3.8 10*3/uL (ref 1.7–7.7)
Neutrophils Relative %: 63 %
Platelets: 199 10*3/uL (ref 150–400)
RBC: 3.97 MIL/uL — ABNORMAL LOW (ref 4.22–5.81)
RDW: 12.3 % (ref 11.5–15.5)
WBC: 6 10*3/uL (ref 4.0–10.5)
nRBC: 0 % (ref 0.0–0.2)

## 2019-02-09 LAB — GROUP A STREP BY PCR: Group A Strep by PCR: NOT DETECTED

## 2019-02-09 LAB — BASIC METABOLIC PANEL
Anion gap: 8 (ref 5–15)
BUN: 13 mg/dL (ref 6–20)
CO2: 28 mmol/L (ref 22–32)
Calcium: 8.9 mg/dL (ref 8.9–10.3)
Chloride: 104 mmol/L (ref 98–111)
Creatinine, Ser: 1.09 mg/dL (ref 0.61–1.24)
GFR calc Af Amer: >60 mL/min (ref >60)
GFR calc non Af Amer: >60 mL/min (ref >60)
Glucose, Bld: 93 mg/dL (ref 70–99)
Potassium: 3.7 mmol/L (ref 3.5–5.1)
Sodium: 140 mmol/L (ref 135–145)

## 2019-02-09 LAB — LACTIC ACID, PLASMA: Lactic Acid, Venous: 1 mmol/L (ref 0.5–1.9)

## 2019-02-09 MED ORDER — CLINDAMYCIN PHOSPHATE 600 MG/50ML IV SOLN
600.0000 mg | Freq: Once | INTRAVENOUS | Status: AC
  Administered 2019-02-09: 600 mg via INTRAVENOUS
  Filled 2019-02-09: qty 50

## 2019-02-09 MED ORDER — CLINDAMYCIN HCL 150 MG PO CAPS: 450.0000 mg | ORAL_CAPSULE | Freq: Three times a day (TID) | ORAL | 0 refills | Status: AC

## 2019-02-09 MED ORDER — DEXAMETHASONE SODIUM PHOSPHATE 10 MG/ML IJ SOLN
10.0000 mg | Freq: Once | INTRAMUSCULAR | Status: AC
  Administered 2019-02-09: 13:00:00 10 mg via INTRAVENOUS
  Filled 2019-02-09: qty 1

## 2019-02-09 MED ORDER — SODIUM CHLORIDE 0.9 % IV BOLUS
1000.0000 mL | Freq: Once | INTRAVENOUS | Status: AC
  Administered 2019-02-09: 1000 mL via INTRAVENOUS

## 2019-02-09 MED ORDER — PREDNISONE 20 MG PO TABS: 60.0000 mg | ORAL_TABLET | Freq: Once | ORAL | Status: DC

## 2019-02-09 MED ORDER — IOHEXOL 300 MG/ML  SOLN
100.0000 mL | Freq: Once | INTRAMUSCULAR | Status: AC | PRN
  Administered 2019-02-09: 100 mL via INTRAVENOUS

## 2019-02-09 MED ORDER — SODIUM CHLORIDE (PF) 0.9 % IJ SOLN
INTRAMUSCULAR | Status: AC
  Filled 2019-02-09: qty 50

## 2019-02-09 MED ORDER — MORPHINE SULFATE (PF) 4 MG/ML IV SOLN
4.0000 mg | Freq: Once | INTRAVENOUS | Status: AC
  Administered 2019-02-09: 13:00:00 4 mg via INTRAVENOUS
  Filled 2019-02-09: qty 1

## 2019-02-09 NOTE — ED Provider Notes (Signed)
[facility]-EMERGENCY DEPT Provider Note   CSN: [id number] Arrival date & time: 02/09/19  1119    History   Chief Complaint Chief Complaint  Patient presents with  . Facial Swelling  . Jaw Pain    l/side    HPI Dan Europearon L Manfredonia is a 23 y.o. 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  Onset Wednesday night, persistent, worsening.  There is associated swelling to this area, difficulty opening her jaw, rhinorrhea.  No interventions for this.  Pain is worse with palpation of the lateral neck and not bend.  Denies sick contacts.  No associated fevers, sore throat, cough, chest pain, shortness of breath.  No recent travel.  No exposure to suspected or confirmed COVID-19.     HPI  Past Medical History:  Diagnosis Date  . Hepatitis B immune   . HIV disease (HCC) dx'd ~ 2012/2013  . Immune deficiency disorder (HCC)   . 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 Seizures (HCC) 2017   "probably stress related" (05/24/2017)  . Syphilis     Patient Active Problem List   Diagnosis Date Noted  . 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 Enteropathogenic Escherichia coli infection 05/26/2017  . Acute renal failure (ARF) (HCC) 05/24/2017  . Abnormal brain MRI 02/07/2017  . Rash 11/15/2016  . Hypotension 11/05/2016  . Shingles 09/14/2016  . 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 Depression 09/08/2016  . HIV disease (HCC)   . Seizure (HCC) 08/29/2016    Past Surgical History:  Procedure Laterality Date  . ANAL EXAMINATION UNDER ANESTHESIA    . NO PAST SURGERIES    . WISDOM TOOTH EXTRACTION          Home Medications    Prior to Admission medications   Medication Sig Start Date End Date Taking? Authorizing Provider  bictegravir-emtricitabine-tenofovir AF (BIKTARVY) 50-200-25 MG TABS tablet Take 1 tablet by mouth daily.    [provider]   bictegravir-emtricitabine-tenofovir AF (BIKTARVY) 50-200-25 MG TABS tablet Take 1 tablet by mouth daily. 02/05/19   Powers, Arley PhenixJames N, MD  ibuprofen (ADVIL,MOTRIN) 200 MG tablet Take 200 mg by mouth every 6 (six) hours as needed for moderate pain.    [provider]    Family History History reviewed. No pertinent family history.  Social History Social History   Tobacco Use  . Smoking status: Current Every Day Smoker    Packs/day: 0.25    Years: 6.00    Pack years: 1.50    Types: Cigarettes  . Smokeless tobacco: Never Used  Substance Use Topics  . Alcohol use: Yes    Comment: occ  . Drug use: Yes    Comment: 05/24/2017 "qd"     Allergies   Patient has no known allergies.   Review of Systems Review of Systems  Musculoskeletal: Positive for neck pain.  Allergic/Immunologic: Positive for immunocompromised state.  All other systems reviewed and are negative.    Physical Exam Updated Vital Signs BP 122/84   Pulse 79   Temp 98.7 F (37.1 C) (Oral)   Resp 16   Wt 73.5 kg   SpO2 100%   BMI 18.25 kg/m   Physical Exam Vitals signs and nursing note reviewed.  Constitutional:      General: He is not in acute distress.    Appearance: He is well-developed.     Comments: NAD.  HENT:  Head: Normocephalic and atraumatic.     Right Ear: External ear normal.     Left Ear: External ear normal.     Nose: Nose normal.  Eyes:     General: No scleral icterus.    Conjunctiva/sclera: Conjunctivae normal.  Neck:     Musculoskeletal: Normal range of motion and neck supple. Muscular tenderness present.     Comments: Obvious mild/moderate asymmetry to LEFT submandibular space with some loss of jaw angle.  Left anterior cervical LAD.  Mild trismus of 3 finger widths.  Slight asymmetry of tonsils and soft palate L>R. Uvula midline. Oropharynx easily visualized. No sublingual fullness/tenderness. Normal protrusion of the tongue. No potato voice. Tolerating secretions. Full  ROM of neck without pain or rigidity.  Cardiovascular:     Rate and Rhythm: Normal rate and regular rhythm.     Heart sounds: Normal heart sounds. No murmur.  Pulmonary:     Effort: Pulmonary effort is normal.     Breath sounds: Normal breath sounds. No wheezing.  Musculoskeletal: Normal range of motion.        General: No deformity.  Skin:    General: Skin is warm and dry.     Capillary Refill: Capillary refill takes less than 2 seconds.  Neurological:     Mental Status: He is alert and oriented to person, place, and time.  Psychiatric:        Behavior: Behavior normal.        Thought Content: Thought content normal.        Judgment: Judgment normal.      ED Treatments / Results  Labs (all labs ordered are listed, but only abnormal results are displayed) Labs Reviewed  CBC WITH DIFFERENTIAL/PLATELET - Abnormal; Notable for the following components:      Result Value   RBC 3.97 (*)    Hemoglobin 12.5 (*)    HCT 38.6 (*)    Eosinophils Absolute 0.6 (*)    All other components within normal limits  GROUP A STREP BY PCR  BASIC METABOLIC PANEL  LACTIC ACID, PLASMA    EKG None  Radiology No results found.  Procedures Procedures (including critical care time)  Medications Ordered in ED Medications  sodium chloride (PF) 0.9 % injection (has no administration in time range)  sodium chloride 0.9 % bolus 1,000 mL (0 mLs Intravenous Stopped 02/09/19 1507)  clindamycin (CLEOCIN) IVPB 600 mg (0 mg Intravenous Stopped 02/09/19 1347)  morphine 4 MG/ML injection 4 mg (4 mg Intravenous Given 02/09/19 1306)  dexamethasone (DECADRON) injection 10 mg (10 mg Intravenous Given 02/09/19 1309)  iohexol (OMNIPAQUE) 300 MG/ML solution 100 mL (100 mLs Intravenous Contrast Given 02/09/19 1456)     Initial Impression / Assessment and Plan / ED Course  I have reviewed the triage vital signs and the nursing notes.  Pertinent labs & imaging results that were available during my care of the  patient were reviewed by me and considered in my medical decision making (see chart for details).       Given asymmetry concern for PTA vs RPA.  No sublingual fullness/tenderness and Ludwig's less likely.  Tolerating secretions. Given CD4 count feel he needs labs, CT for further evaluation.  Will give IVF, decadron, morphine, clindamycin, reassess.  Final Clinical Impressions(s) / ED Diagnoses   1530: Labs unremarkable. CT delay, pending.  Discussed plan to likely discharge with clindamycin, high dose NSAIDs, oral hydration, warm gargles and ENT f/u based on CT scans for suspected PTA/pharyngitis. He is tolerating  PO. Pt handed off to oncoming EDPA at shift change who will f/u on CT and strep. Pt comfortable with this.  Final diagnoses:  Neck pain on left side    ED Discharge Orders    None       Jerrell Mylar 02/09/19 1530    Loren Racer, MD 02/11/19 [id number]

## 2019-02-09 NOTE — Discharge Instructions (Signed)
You were seen in the ED today for facial swelling; your CT scan showed a very small abscess in your parotid gland; please follow up with ENT Dr. Jearld Fenton on Monday regarding your ED visit. Take your antibiotics as prescribed; if you do not have improvement in your symptoms tomorrow please return to the ED for reevaluation.

## 2019-02-09 NOTE — ED Provider Notes (Signed)
Care assumed from Sharen Heck, [place], at shift change, please see their notes for full documentation of patient's complaint/HPI. Briefly, pt here with left sided facial swelling and trismus x 3 days. Results so far show reassuring bloodwork without leukocytosis. Awaiting CT soft tissue neck as well as rapid strep test. Plan is to discharge if CT neg for PTA with clindamycin and high dose NSAIDs with ENT follow up.   Physical Exam  BP 122/84   Pulse 79   Temp 98.7 F (37.1 C) (Oral)   Resp 16   Wt 73.5 kg   SpO2 100%   BMI 18.25 kg/m   Physical Exam Vitals signs and nursing note reviewed.  Constitutional:      Appearance: He is not ill-appearing.  HENT:     Head: Normocephalic and atraumatic.     Mouth/Throat:     Lips: Pink.     Mouth: Mucous membranes are moist.     Pharynx: Pharyngeal swelling and posterior oropharyngeal erythema present.     Comments: Small amount of asymmetry to left tonsil  Eyes:     Conjunctiva/sclera: Conjunctivae normal.  Neck:     Musculoskeletal: Neck supple.  Cardiovascular:     Rate and Rhythm: Normal rate and regular rhythm.     Pulses: Normal pulses.     Heart sounds: No murmur.  Pulmonary:     Effort: Pulmonary effort is normal.     Breath sounds: Normal breath sounds. No wheezing, rhonchi or rales.  Abdominal:     Palpations: Abdomen is soft.     Tenderness: There is no abdominal tenderness.  Skin:    General: Skin is warm and dry.  Neurological:     Mental Status: He is alert.      ED Course/Procedures     Strep test negative. CT scan with small abscess to parotid gland; will discharge patient with ENT follow up with Dr. Jearld Fenton. Rx Clindamycin x 7 days given for patient as well. Advised patient if he has no improvement in 24 hours he needs to return to the ED for further evaluation given it is the weekend and he will be unable to contact [place] until Monday. Pt is in agreement with plan at this time and stable for discharge home.    MDM  MDM Number of Diagnoses or Management Options Abscess of parotid gland:  Neck pain on left side:         Tanda Rockers, PA-C 02/09/19 1630    Jacalyn Lefevre, MD 02/09/19 1640

## 2019-02-09 NOTE — ED Notes (Signed)
Bed: WTR5 Expected date:  Expected time:  Means of arrival:  Comments: 23 yo facial swelling

## 2019-02-09 NOTE — ED Triage Notes (Signed)
Per EMS:Pt called EMS with c/o L/sided facial swelling x 3 days. Pt reports l/jaw pain, increasing with movement of mouth. Denies difficulty swallowing.

## 2019-02-12 ENCOUNTER — Emergency Department (HOSPITAL_COMMUNITY)
Admission: EM | Admit: 2019-02-12 | Discharge: 2019-02-12 | Disposition: A | Discharge status: Home / Self Care | Payer: Medicare Other | Attending: Emergency Medicine | Admitting: Emergency Medicine

## 2019-02-12 ENCOUNTER — Other Ambulatory Visit: Payer: Self-pay

## 2019-02-12 DIAGNOSIS — F17210 Nicotine dependence, cigarettes, uncomplicated: Secondary | ICD-10-CM | POA: Insufficient documentation

## 2019-02-12 DIAGNOSIS — T391X1A Poisoning by 4-Aminophenol derivatives, accidental (unintentional), initial encounter: Secondary | ICD-10-CM | POA: Insufficient documentation

## 2019-02-12 DIAGNOSIS — Z21 Asymptomatic human immunodeficiency virus [HIV] infection status: Secondary | ICD-10-CM | POA: Diagnosis not present

## 2019-02-12 DIAGNOSIS — R1110 Vomiting, unspecified: Secondary | ICD-10-CM | POA: Diagnosis present

## 2019-02-12 DIAGNOSIS — R112 Nausea with vomiting, unspecified: Secondary | ICD-10-CM | POA: Diagnosis not present

## 2019-02-12 DIAGNOSIS — R569 Unspecified convulsions: Secondary | ICD-10-CM | POA: Diagnosis not present

## 2019-02-12 LAB — CBC WITH DIFFERENTIAL/PLATELET
Abs Immature Granulocytes: 0.01 10*3/uL (ref 0.00–0.07)
Basophils Absolute: 0 10*3/uL (ref 0.0–0.1)
Basophils Relative: 0 %
Eosinophils Absolute: 0.2 10*3/uL (ref 0.0–0.5)
Eosinophils Relative: 4 %
HCT: 35.5 % — ABNORMAL LOW (ref 39.0–52.0)
Hemoglobin: 11.6 g/dL — ABNORMAL LOW (ref 13.0–17.0)
Immature Granulocytes: 0 %
Lymphocytes Relative: 13 %
Lymphs Abs: 0.5 10*3/uL — ABNORMAL LOW (ref 0.7–4.0)
MCH: 31.4 pg (ref 26.0–34.0)
MCHC: 32.7 g/dL (ref 30.0–36.0)
MCV: 96.2 fL (ref 80.0–100.0)
Monocytes Absolute: 0.3 10*3/uL (ref 0.1–1.0)
Monocytes Relative: 8 %
Neutro Abs: 3 10*3/uL (ref 1.7–7.7)
Neutrophils Relative %: 75 %
Platelets: 238 10*3/uL (ref 150–400)
RBC: 3.69 MIL/uL — ABNORMAL LOW (ref 4.22–5.81)
RDW: 11.8 % (ref 11.5–15.5)
WBC: 4 10*3/uL (ref 4.0–10.5)
nRBC: 0 % (ref 0.0–0.2)

## 2019-02-12 LAB — LACTIC ACID, PLASMA: Lactic Acid, Venous: 1.1 mmol/L (ref 0.5–1.9)

## 2019-02-12 LAB — COMPREHENSIVE METABOLIC PANEL
ALT: 10 U/L (ref 0–44)
AST: 13 U/L — ABNORMAL LOW (ref 15–41)
Albumin: 3.5 g/dL (ref 3.5–5.0)
Alkaline Phosphatase: 60 U/L (ref 38–126)
Anion gap: 8 (ref 5–15)
BUN: 15 mg/dL (ref 6–20)
CO2: 25 mmol/L (ref 22–32)
Calcium: 8.5 mg/dL — ABNORMAL LOW (ref 8.9–10.3)
Chloride: 106 mmol/L (ref 98–111)
Creatinine, Ser: 0.9 mg/dL (ref 0.61–1.24)
GFR calc Af Amer: >60 mL/min (ref >60)
GFR calc non Af Amer: >60 mL/min (ref >60)
Glucose, Bld: 86 mg/dL (ref 70–99)
Potassium: 3.2 mmol/L — ABNORMAL LOW (ref 3.5–5.1)
Sodium: 139 mmol/L (ref 135–145)
Total Bilirubin: 0.5 mg/dL (ref 0.3–1.2)
Total Protein: 8.5 g/dL — ABNORMAL HIGH (ref 6.5–8.1)

## 2019-02-12 LAB — LIPASE, BLOOD: Lipase: 22 U/L (ref 11–51)

## 2019-02-12 LAB — ACETAMINOPHEN LEVEL
Acetaminophen (Tylenol), Serum: 17 ug/mL (ref 10–30)
Acetaminophen (Tylenol), Serum: <10 ug/mL — ABNORMAL LOW (ref 10–30)

## 2019-02-12 LAB — PROTIME-INR
INR: 1.1 (ref 0.8–1.2)
Prothrombin Time: 13.6 seconds (ref 11.4–15.2)

## 2019-02-12 LAB — SALICYLATE LEVEL: Salicylate Lvl: <7 mg/dL (ref 2.8–30.0)

## 2019-02-12 LAB — AMMONIA: Ammonia: 12 umol/L (ref 9–35)

## 2019-02-12 MED ORDER — SODIUM CHLORIDE 0.9 % IV BOLUS
1000.0000 mL | Freq: Once | INTRAVENOUS | Status: AC
  Administered 2019-02-12: 19:00:00 1000 mL via INTRAVENOUS

## 2019-02-12 NOTE — ED Triage Notes (Signed)
Pt coming from home by [facility] complaining of nausea and vomiting since noon. Pt reports to EMS that he took more than 12 tylenol due to an abscess/knot underneath L ear. Pt did not take the numerous amounts to tylenol to harm himself. 5-6 episodes of vomiting and 4mg  IM zofran given PTA with EMS.

## 2019-02-12 NOTE — Discharge Instructions (Signed)
You were seen in the ED today for nausea and vomiting after ingesting more than the recommended daily dose of Tylenol; your bloodwork was reassuring in the ED today; your liver function tests were normal and your acetaminophen level decreased to below detection after receiving IV fluids. Please follow up with your PCP regarding your ED visit.   Also please reschedule appointment with Dr. Jearld Fenton regarding your parotid gland abscess.

## 2019-02-12 NOTE — ED Provider Notes (Signed)
[facility]-EMERGENCY DEPT Provider Note   CSN: [id number] Arrival date & time: 02/12/19  1734    History   Chief Complaint Chief Complaint  Patient presents with   Emesis    HPI [name] [name] is a 23 y.o. male with PMHx HIV/AIDS with CD4 count 33 on [employer] who presents to the ED complaining of nausea and nonbloody emesis that began earlier today. Pt was recently seen in the ED on 05/16 for left sided neck pain/trisumus and found to have a 14 x 14 x 18 mm low density collection within the left parotid gland. Pt was discharged home with Clindamycin and follow up with ENT. He reports he has been taking his Clindamycin as prescribed and his left sided neck/jaw pain has improved but when he woke up this AM he had worsening pain to his right jaw. Pt then proceeded to take "at least 5 grams of Tylenol" from 6:30-8:30 AM but states he may have taken more. He was attempting to take tylenol until that pain went away; pt then fell asleep and woke up around 12 PM today with nausea and TNTC episodes of vomiting. Pt had follow up appt with Dr. Jearld Fenton today for his parotid gland abscess but did not make it to the appt given severe nausea and vomiting. No SI/HI/auditory or visual hallucinations. Denies fever, chills, abdominal pain, diarrhea, constipation, or any other associated symptoms.        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 Authorizing Provider  acetaminophen (TYLENOL) 500 MG tablet Take 1,000 mg by mouth every 6 (six) hours as needed for mild pain.   Yes [provider]  bictegravir-emtricitabine-tenofovir AF (BIKTARVY) 50-200-25 MG TABS tablet Take 1 tablet by mouth daily. 02/05/19  Yes Powers, Arley Phenix, MD  clindamycin (CLEOCIN) 150 MG capsule Take 3 capsules (450 mg total) by mouth 3 (three) times daily for 7 days. 02/09/19 02/16/19 Yes Tanda Rockers, PA-C    Family History No family history on file.  Social History Social History   Tobacco Use   Smoking status: Current Every Day Smoker    Packs/day: 0.25    Years: 6.00    Pack years: 1.50    Types: Cigarettes   Smokeless tobacco: Never Used  Substance Use Topics   Alcohol use: Yes    Comment: occ   Drug use: Yes    Comment: 05/24/2017 "qd"     Allergies  Patient has no known allergies.   Review of Systems Review of Systems  Constitutional: Negative for chills and fever.  HENT: Negative for congestion, ear pain, sore throat, trouble swallowing and voice change.   Eyes: Negative for visual disturbance.  Respiratory: Negative for cough and shortness of breath.   Cardiovascular: Negative for chest pain.  Gastrointestinal: Positive for nausea and vomiting. Negative for abdominal pain, blood in stool, constipation and diarrhea.  Genitourinary: Negative for dysuria, flank pain, frequency and hematuria.  Musculoskeletal: Negative for myalgias.  Skin: Negative for rash.  Allergic/Immunologic: Positive for immunocompromised  state.  Neurological: Negative for headaches.     Physical Exam Updated Vital Signs BP (!) 137/92 (BP Location: Left Arm)    Pulse (!) 54    Temp 98.1 F (36.7 C) (Oral)    Resp 18    Ht  (2.007 m)    Wt 73.5 kg    SpO2 100%    BMI 18.25 kg/m   Physical Exam Vitals signs and nursing note reviewed.  Constitutional:      Appearance: He is not ill-appearing or diaphoretic.  HENT:     Head: Normocephalic and atraumatic.     Right Ear: Tympanic membrane normal.     Left Ear: Tympanic membrane normal.     Nose: Nose normal.     Mouth/Throat:     Mouth: Mucous membranes are moist.     Pharynx: No oropharyngeal exudate or posterior oropharyngeal erythema.     Comments: Posterior oropharynx without erythema, edema, or exudate. Uvula midline. Airway intact.  Eyes:     General: No scleral icterus.    Conjunctiva/sclera: Conjunctivae normal.  Neck:     Musculoskeletal: Normal range of motion and neck supple.  Cardiovascular:     Rate and Rhythm: Regular rhythm. Bradycardia present.     Pulses: Normal pulses.  Pulmonary:     Effort: Pulmonary effort is normal.     Breath sounds: Normal breath sounds. No wheezing, rhonchi or rales.  Abdominal:     Palpations: Abdomen is soft.     Tenderness: There is no abdominal tenderness. There is no guarding or rebound.  Musculoskeletal:     Right lower leg: No edema.     Left lower leg: No edema.  Lymphadenopathy:     Cervical: No cervical adenopathy.  Skin:    General: Skin is warm and dry.     Coloration: Skin is not jaundiced.     Findings: Rash:   Neurological:     Mental Status: He is alert.      ED Treatments / Results  Labs (all labs ordered are listed, but only abnormal results are displayed) Labs Reviewed  COMPREHENSIVE METABOLIC PANEL - Abnormal; Notable for the following components:      Result Value   Potassium 3.2 (*)    Calcium 8.5 (*)    Total Protein 8.5 (*)    AST 13 (*)    All other components within normal  limits  CBC WITH DIFFERENTIAL/PLATELET - Abnormal; Notable for the following components:   RBC 3.69 (*)    Hemoglobin 11.6 (*)    HCT 35.5 (*)    Lymphs Abs 0.5 (*)    All other components within normal limits  ACETAMINOPHEN LEVEL - Abnormal; Notable for the following components:   Acetaminophen (Tylenol), Serum <10 (*)    All other components within normal limits  LIPASE, BLOOD  ACETAMINOPHEN LEVEL  PROTIME-INR  AMMONIA  LACTIC ACID, PLASMA  SALICYLATE LEVEL  EKG EKG Interpretation  Date/Time:  Tuesday Feb 12 2019 19:04:55 EDT Ventricular Rate:  48 PR Interval:    QRS Duration: 103 QT Interval:  441 QTC Calculation: 394 R Axis:   47 Text Interpretation:  Sinus bradycardia RSR' in V1 or V2, probably normal variant Probable left ventricular hypertrophy ST elev, probable normal early repol pattern When compared to prior, slower rate.  No STEMI Confirmed by Theda Belfastegeler, Chris ([id number]) on 02/12/2019 11:02:56 PM   Radiology No results found.  Procedures Procedures (including critical care time)  Medications Ordered in ED Medications  sodium chloride 0.9 % bolus 1,000 mL (0 mLs Intravenous Stopped 02/12/19 1949)     Initial Impression / Assessment and Plan / ED Course  I have reviewed the triage vital signs and the nursing notes.  Pertinent labs & imaging results that were available during my care of the patient were reviewed by me and considered in my medical decision making (see chart for details).    Pt is a 23 year old male who presents with nausea and vomiting after taking at least 5 grams of Tylenol about 12 hours ago. No suicidal attempt/was trying to improve pain to right neck. Seen recently in ED 3 days ago for pain to left side of neck with parotid gland abscess on CT scan with ENT follow up; has been on clindamycin for same with general improvement. Pain has improved since taking Tylenol earlier this AM; pt called EMS after being concerned for potential overdose. Was  given 4 mg IM Zofran en route; not complaining of any abdominal pain; no blood to emesis. Pt mildly bradycardic in the ED at 51; not hypotensive. Satting 100% on RA. No tenderness to abdomen.   Discussed case with Poison Control who recommend baseline bloodwork at this time including CBC, CMP, lipase, PT INR, Salicylate level, acetaminophen level, Lactic acid, and ammonia. If acetaminophen elevated will need to discuss need for mucomyst. They will follow up in a couple of hours once labwork returns.   LFTs within normal limits. Acetaminophen level 17. Per [facility] pt is likely safe to not be treated with NAC given ingestion > 12 hours ago. Nursing staff received call from poison control who suggests repeat acetaminophen level to ensure it is not increasing; pt is feeling improved in the ED; he has not vomited since Zofran via EMS; currently requesting something to eat.   Repeat acetaminophen level < 10; feel patient is appropriate for discharge at this time. Had long discussion with patient regarding following dosage recommendations and not taking such an excessive amount in the future. Pt is in agreement with plan at this time. He feels much better with fluids and zofran in the ED. Will discharge home with PCP follow up/advised patient importance of following up with ENT Dr. Jearld FentonByers per his previous ED visit and missing his appt today.         Final Clinical Impressions(s) / ED Diagnoses   Final diagnoses:  Accidental paracetamol poisoning, initial encounter  Non-intractable vomiting with nausea, unspecified vomiting type    ED Discharge Orders    None       Tanda RockersVenter, Shanelle Clontz, PA-C 02/12/19 2322    Tegeler, Canary Brimhristopher J, MD 02/12/19 [phone]

## 2019-02-15 ENCOUNTER — Other Ambulatory Visit: Payer: Self-pay

## 2019-02-15 ENCOUNTER — Other Ambulatory Visit: Payer: Self-pay | Admitting: Otolaryngology

## 2019-02-15 ENCOUNTER — Encounter (HOSPITAL_COMMUNITY): Payer: Self-pay | Admitting: *Deleted

## 2019-02-15 NOTE — Progress Notes (Signed)
Spoke with pt for pre-op call. Pt denies any cardiac history.    Coronavirus Screening  Have you experienced the following symptoms:  Cough NO Fever (>100.37F)  NO Runny nose NO Sore throat NO Difficulty breathing/shortness of breath  NO  Have you or a family member traveled in the last 14 days and where? NO   If the patient indicates "YES" to the above questions, their PAT will be rescheduled to limit the exposure to others and, the surgeon will be notified. THE PATIENT WILL NEED TO BE ASYMPTOMATIC FOR 14 DAYS.   If the patient is not experiencing any of these symptoms, the PAT nurse will instruct them to NOT bring anyone with them to their appointment since they may have these symptoms or traveled as well.   Patient reminded that hospital visitation restrictions are in effect and the importance of the restrictions.

## 2019-02-16 ENCOUNTER — Ambulatory Visit (HOSPITAL_COMMUNITY): Payer: Medicare Other | Admitting: Anesthesiology

## 2019-02-16 ENCOUNTER — Encounter (HOSPITAL_COMMUNITY): Payer: Self-pay | Admitting: *Deleted

## 2019-02-16 ENCOUNTER — Ambulatory Visit (HOSPITAL_COMMUNITY)
Admission: RE | Admit: 2019-02-16 | Discharge: 2019-02-16 | Disposition: A | Discharge status: Home / Self Care | Payer: Medicare Other | Attending: Otolaryngology | Admitting: Otolaryngology

## 2019-02-16 ENCOUNTER — Encounter (HOSPITAL_COMMUNITY)
Admission: RE | Disposition: A | Discharge status: Home / Self Care | Payer: Self-pay | Source: Home / Self Care | Attending: Otolaryngology

## 2019-02-16 ENCOUNTER — Other Ambulatory Visit: Payer: Self-pay

## 2019-02-16 DIAGNOSIS — Z79899 Other long term (current) drug therapy: Secondary | ICD-10-CM | POA: Insufficient documentation

## 2019-02-16 DIAGNOSIS — F17210 Nicotine dependence, cigarettes, uncomplicated: Secondary | ICD-10-CM | POA: Diagnosis not present

## 2019-02-16 DIAGNOSIS — L0211 Cutaneous abscess of neck: Secondary | ICD-10-CM | POA: Insufficient documentation

## 2019-02-16 DIAGNOSIS — Z21 Asymptomatic human immunodeficiency virus [HIV] infection status: Secondary | ICD-10-CM | POA: Diagnosis not present

## 2019-02-16 DIAGNOSIS — Z1159 Encounter for screening for other viral diseases: Secondary | ICD-10-CM | POA: Diagnosis not present

## 2019-02-16 HISTORY — PX: INCISION AND DRAINAGE ABSCESS: SHX5864

## 2019-02-16 LAB — SARS CORONAVIRUS 2 BY RT PCR (HOSPITAL ORDER, PERFORMED IN ~~LOC~~ HOSPITAL LAB): SARS Coronavirus 2: NEGATIVE

## 2019-02-16 SURGERY — INCISION AND DRAINAGE, ABSCESS
Anesthesia: General | Site: Neck | Laterality: Left

## 2019-02-16 MED ORDER — PROPOFOL 10 MG/ML IV BOLUS
INTRAVENOUS | Status: AC
  Filled 2019-02-16: qty 20

## 2019-02-16 MED ORDER — FENTANYL CITRATE (PF) 250 MCG/5ML IJ SOLN
INTRAMUSCULAR | Status: AC
  Filled 2019-02-16: qty 5

## 2019-02-16 MED ORDER — LIDOCAINE-EPINEPHRINE 1 %-1:100000 IJ SOLN
INTRAMUSCULAR | Status: DC | PRN
  Administered 2019-02-16: 1 mL

## 2019-02-16 MED ORDER — SODIUM CHLORIDE 0.9 % IR SOLN
Status: DC | PRN
  Administered 2019-02-16: 1000 mL

## 2019-02-16 MED ORDER — MIDAZOLAM HCL 2 MG/2ML IJ SOLN
INTRAMUSCULAR | Status: DC | PRN
  Administered 2019-02-16: 2 mg via INTRAVENOUS

## 2019-02-16 MED ORDER — CLINDAMYCIN HCL 300 MG PO CAPS: 300.0000 mg | ORAL_CAPSULE | Freq: Three times a day (TID) | ORAL | 0 refills | Status: DC

## 2019-02-16 MED ORDER — LIDOCAINE 2% (20 MG/ML) 5 ML SYRINGE
INTRAMUSCULAR | Status: AC
  Filled 2019-02-16: qty 5

## 2019-02-16 MED ORDER — DEXAMETHASONE SODIUM PHOSPHATE 10 MG/ML IJ SOLN
INTRAMUSCULAR | Status: AC
  Filled 2019-02-16: qty 1

## 2019-02-16 MED ORDER — LIDOCAINE 2% (20 MG/ML) 5 ML SYRINGE
INTRAMUSCULAR | Status: DC | PRN
  Administered 2019-02-16: 70 mg via INTRAVENOUS

## 2019-02-16 MED ORDER — CEFAZOLIN SODIUM-DEXTROSE 2-4 GM/100ML-% IV SOLN
2.0000 g | INTRAVENOUS | Status: AC
  Administered 2019-02-16: 2 g via INTRAVENOUS
  Filled 2019-02-16: qty 100

## 2019-02-16 MED ORDER — LACTATED RINGERS IV SOLN
INTRAVENOUS | Status: DC
  Administered 2019-02-16: 09:00:00 via INTRAVENOUS

## 2019-02-16 MED ORDER — DEXMEDETOMIDINE HCL IN NACL 200 MCG/50ML IV SOLN
INTRAVENOUS | Status: AC
  Filled 2019-02-16: qty 50

## 2019-02-16 MED ORDER — PROMETHAZINE HCL 25 MG/ML IJ SOLN: 6.2500 mg | INTRAMUSCULAR | Status: DC | PRN

## 2019-02-16 MED ORDER — ACETAMINOPHEN 500 MG PO TABS
1000.0000 mg | ORAL_TABLET | Freq: Once | ORAL | Status: AC
  Administered 2019-02-16: 1000 mg via ORAL
  Filled 2019-02-16: qty 2

## 2019-02-16 MED ORDER — PROPOFOL 10 MG/ML IV BOLUS
INTRAVENOUS | Status: DC | PRN
  Administered 2019-02-16: 200 mg via INTRAVENOUS

## 2019-02-16 MED ORDER — CEFAZOLIN SODIUM 1 G IJ SOLR
INTRAMUSCULAR | Status: AC
  Filled 2019-02-16: qty 10

## 2019-02-16 MED ORDER — FENTANYL CITRATE (PF) 250 MCG/5ML IJ SOLN
INTRAMUSCULAR | Status: DC | PRN
  Administered 2019-02-16 (×2): 50 ug via INTRAVENOUS
  Administered 2019-02-16: 100 ug via INTRAVENOUS
  Administered 2019-02-16: 50 ug via INTRAVENOUS

## 2019-02-16 MED ORDER — LIDOCAINE-EPINEPHRINE 1 %-1:100000 IJ SOLN
INTRAMUSCULAR | Status: AC
  Filled 2019-02-16: qty 1

## 2019-02-16 MED ORDER — ONDANSETRON HCL 4 MG/2ML IJ SOLN
INTRAMUSCULAR | Status: DC | PRN
  Administered 2019-02-16: 4 mg via INTRAVENOUS

## 2019-02-16 MED ORDER — HYDROCODONE-ACETAMINOPHEN 5-325 MG PO TABS: 1.0000 | ORAL_TABLET | Freq: Four times a day (QID) | ORAL | 0 refills | Status: DC | PRN

## 2019-02-16 MED ORDER — ONDANSETRON HCL 4 MG/2ML IJ SOLN
INTRAMUSCULAR | Status: AC
  Filled 2019-02-16: qty 2

## 2019-02-16 MED ORDER — SUCCINYLCHOLINE CHLORIDE 200 MG/10ML IV SOSY
PREFILLED_SYRINGE | INTRAVENOUS | Status: DC | PRN
  Administered 2019-02-16: 120 mg via INTRAVENOUS

## 2019-02-16 MED ORDER — SUCCINYLCHOLINE CHLORIDE 200 MG/10ML IV SOSY
PREFILLED_SYRINGE | INTRAVENOUS | Status: AC
  Filled 2019-02-16: qty 10

## 2019-02-16 MED ORDER — DEXAMETHASONE SODIUM PHOSPHATE 10 MG/ML IJ SOLN
INTRAMUSCULAR | Status: DC | PRN
  Administered 2019-02-16: 10 mg via INTRAVENOUS

## 2019-02-16 MED ORDER — FENTANYL CITRATE (PF) 100 MCG/2ML IJ SOLN
25.0000 ug | INTRAMUSCULAR | Status: DC | PRN
  Administered 2019-02-16: 50 ug via INTRAVENOUS

## 2019-02-16 MED ORDER — FENTANYL CITRATE (PF) 100 MCG/2ML IJ SOLN
INTRAMUSCULAR | Status: AC
  Administered 2019-02-16: 50 ug via INTRAVENOUS
  Filled 2019-02-16: qty 2

## 2019-02-16 MED ORDER — HYDROMORPHONE HCL 1 MG/ML IJ SOLN: 0.2500 mg | INTRAMUSCULAR | Status: DC | PRN

## 2019-02-16 MED ORDER — MIDAZOLAM HCL 2 MG/2ML IJ SOLN
INTRAMUSCULAR | Status: AC
  Filled 2019-02-16: qty 2

## 2019-02-16 SURGICAL SUPPLY — 38 items
BLADE SURG 15 STRL LF DISP TIS (BLADE) IMPLANT
BLADE SURG 15 STRL SS (BLADE)
BNDG CONFORM 2 STRL LF (GAUZE/BANDAGES/DRESSINGS) ×2 IMPLANT
BNDG GAUZE ELAST 4 BULKY (GAUZE/BANDAGES/DRESSINGS) ×2 IMPLANT
CATH ROBINSON RED A/P 12FR (CATHETERS) ×2 IMPLANT
COVER SURGICAL LIGHT HANDLE (MISCELLANEOUS) ×4 IMPLANT
COVER WAND RF STERILE (DRAPES) IMPLANT
CRADLE DONUT ADULT HEAD (MISCELLANEOUS) IMPLANT
DRAIN PENROSE 1/4X12 LTX STRL (WOUND CARE) ×2 IMPLANT
DRAPE HALF SHEET 40X57 (DRAPES) IMPLANT
DRAPE ORTHO SPLIT 77X108 STRL (DRAPES) ×1
DRAPE SURG ORHT 6 SPLT 77X108 (DRAPES) ×1 IMPLANT
DRSG PAD ABDOMINAL 8X10 ST (GAUZE/BANDAGES/DRESSINGS) IMPLANT
ELECT COATED BLADE 2.86 ST (ELECTRODE) ×2 IMPLANT
ELECT REM PT RETURN 9FT ADLT (ELECTROSURGICAL) ×2
ELECTRODE REM PT RTRN 9FT ADLT (ELECTROSURGICAL) ×1 IMPLANT
GAUZE 4X4 16PLY RFD (DISPOSABLE) ×2 IMPLANT
GAUZE SPONGE 4X4 12PLY STRL (GAUZE/BANDAGES/DRESSINGS) IMPLANT
GLOVE BIO SURGEON STRL SZ7.5 (GLOVE) ×2 IMPLANT
GOWN STRL REUS W/ TWL LRG LVL3 (GOWN DISPOSABLE) ×1 IMPLANT
GOWN STRL REUS W/TWL LRG LVL3 (GOWN DISPOSABLE) ×1
KIT BASIN OR (CUSTOM PROCEDURE TRAY) ×2 IMPLANT
KIT TURNOVER KIT B (KITS) ×2 IMPLANT
MARKER SKIN DUAL TIP RULER LAB (MISCELLANEOUS) ×2 IMPLANT
NEEDLE HYPO 25GX1X1/2 BEV (NEEDLE) ×2 IMPLANT
NS IRRIG 1000ML POUR BTL (IV SOLUTION) ×2 IMPLANT
PACK SURGICAL SETUP 50X90 (CUSTOM PROCEDURE TRAY) ×2 IMPLANT
PAD ARMBOARD 7.5X6 YLW CONV (MISCELLANEOUS) ×4 IMPLANT
PENCIL BUTTON HOLSTER BLD 10FT (ELECTRODE) ×2 IMPLANT
RUBBERBAND STERILE (MISCELLANEOUS) IMPLANT
SUT ETHILON 2 0 FS 18 (SUTURE) ×2 IMPLANT
SUT SILK 2 0 SH CR/8 (SUTURE) ×2 IMPLANT
SWAB COLLECTION DEVICE MRSA (MISCELLANEOUS) ×2 IMPLANT
SWAB CULTURE ESWAB REG 1ML (MISCELLANEOUS) ×2 IMPLANT
SYR BULB IRRIGATION 50ML (SYRINGE) ×2 IMPLANT
SYR CONTROL 10ML LL (SYRINGE) ×2 IMPLANT
TUBE CONNECTING 12X1/4 (SUCTIONS) ×2 IMPLANT
YANKAUER SUCT BULB TIP NO VENT (SUCTIONS) ×2 IMPLANT

## 2019-02-16 NOTE — Anesthesia Preprocedure Evaluation (Addendum)
Anesthesia Evaluation  Patient identified by MRN, date of birth, ID band Patient awake    Reviewed: Allergy & Precautions, NPO status , Patient's Chart, lab work & pertinent test results  Airway Mallampati: II  TM Distance: >3 FB Neck ROM: Full    Dental no notable dental hx. (+) Dental Advisory Given   Pulmonary Current Smoker,    Pulmonary exam normal        Cardiovascular negative cardio ROS Normal cardiovascular exam     Neuro/Psych Seizures -, Well Controlled,  PSYCHIATRIC DISORDERS Depression    GI/Hepatic negative GI ROS, Neg liver ROS,   Endo/Other  negative endocrine ROS  Renal/GU negative Renal ROS     Musculoskeletal negative musculoskeletal ROS (+)   Abdominal   Peds  Hematology  (+) HIV,   Anesthesia Other Findings Day of surgery medications reviewed with the patient.  Reproductive/Obstetrics                           Anesthesia Physical Anesthesia Plan  ASA: II  Anesthesia Plan: General   Post-op Pain Management:    Induction: Intravenous  PONV Risk Score and Plan: 2 and Ondansetron and Dexamethasone  Airway Management Planned: Oral ETT  Additional Equipment:   Intra-op Plan:   Post-operative Plan: Extubation in OR  Informed Consent: I have reviewed the patients History and Physical, chart, labs and discussed the procedure including the risks, benefits and alternatives for the proposed anesthesia with the patient or authorized representative who has indicated his/her understanding and acceptance.     Dental advisory given  Plan Discussed with: CRNA and Anesthesiologist  Anesthesia Plan Comments:        Anesthesia Quick Evaluation

## 2019-02-16 NOTE — Anesthesia Postprocedure Evaluation (Signed)
Anesthesia Post Note  Patient: [name] [name]  Procedure(s) Performed: INCISION AND DRAINAGE ABSCESS (Left Neck)     Patient location during evaluation: PACU Anesthesia Type: General Level of consciousness: sedated Pain management: pain level controlled Vital Signs Assessment: post-procedure vital signs reviewed and stable Respiratory status: spontaneous breathing and respiratory function stable Cardiovascular status: stable Postop Assessment: no apparent nausea or vomiting Anesthetic complications: no    Last Vitals:  Vitals:   02/16/19 1025 02/16/19 1038  BP: (!) 132/96 (!) 132/96  Pulse: (!) 49 (!) 50  Resp: 15 16  Temp:  36.7 C  SpO2: 100% 100%    Last Pain:  Vitals:   02/16/19 1030  TempSrc:   PainSc: 3                  Kenika Sahm DANIEL

## 2019-02-16 NOTE — H&P (Signed)
[name] [name] is an 23 y.o. male.   Chief Complaint: Left parotid abscess HPI: 23 year old with HIV presents with over a week of left parotid pain and swelling.  CT imaging demonstrated a small fluid collection and he was treated with antibiotics.  Swelling initially improved but has worsened again.  Surgical drainage was recommended.  Past Medical History:  Diagnosis Date  . Hepatitis B immune   . HIV disease (HCC) dx'd ~ 2012/2013  . Immune deficiency disorder (HCC)   . 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 Seizures (HCC) 2017   "probably stress related" (05/24/2017)  . Syphilis     Past Surgical History:  Procedure Laterality Date  . ANAL EXAMINATION UNDER ANESTHESIA    . WISDOM TOOTH EXTRACTION      History reviewed. No pertinent family history. Social History:  reports that he has been smoking cigarettes. He has a 3.00 pack-year smoking history. He has never used smokeless tobacco. He reports current alcohol use. He reports current drug use. Drug: Marijuana.  Allergies: No Known Allergies  Medications Prior to Admission  Medication Sig Dispense Refill  . acetaminophen (TYLENOL) 500 MG tablet Take 1,000 mg by mouth every 6 (six) hours as needed for mild pain.    . bictegravir-emtricitabine-tenofovir AF (BIKTARVY) 50-200-25 MG TABS tablet Take 1 tablet by mouth daily. 30 tablet 1  . clindamycin (CLEOCIN) 150 MG capsule Take 3 capsules (450 mg total) by mouth 3 (three) times daily for 7 days. 63 capsule 0    Results for orders placed or performed during the hospital encounter of 02/16/19 (from the past 48 hour(s))  SARS Coronavirus 2 (CEPHEID - Performed in [facility] hospital lab), Hosp Order     Status: None   Collection Time: 02/16/19  7:29 AM  Result Value Ref Range   SARS Coronavirus 2 NEGATIVE NEGATIVE    Comment: (NOTE) If result is NEGATIVE SARS-CoV-2 target nucleic acids are NOT DETECTED. The SARS-CoV-2 RNA is generally  detectable in upper and lower  respiratory specimens during the acute phase of infection. The lowest  concentration of SARS-CoV-2 viral copies this assay can detect is 250  copies / mL. A negative result does not preclude SARS-CoV-2 infection  and should not be used as the sole basis for treatment or other  patient management decisions.  A negative result may occur with  improper specimen collection / handling, submission of specimen other  than nasopharyngeal swab, presence of viral mutation(s) within the  areas targeted by this assay, and inadequate number of viral copies  (<250 copies / mL). A negative result must be combined with clinical  observations, patient history, and epidemiological information. If result is POSITIVE SARS-CoV-2 target nucleic acids are DETECTED. The SARS-CoV-2 RNA is generally detectable in upper and lower  respiratory specimens dur ing the acute phase of infection.  Positive  results are indicative of active infection with SARS-CoV-2.  Clinical  correlation with patient history and other diagnostic information is  necessary to determine patient infection status.  Positive results do  not rule out bacterial infection or co-infection with other viruses. If result is PRESUMPTIVE POSTIVE SARS-CoV-2 nucleic acids MAY BE PRESENT.   A presumptive positive result was obtained on the submitted specimen  and confirmed on repeat testing.  While 2019 novel coronavirus  (SARS-CoV-2) nucleic acids may be present in the submitted sample  additional confirmatory testing may be necessary for epidemiological  and / or clinical management purposes  to differentiate between  SARS-CoV-2 and other Sarbecovirus currently known to infect humans.  If clinically indicated additional testing with an alternate test  methodology [phone]([id number]) is advised. The SARS-CoV-2 RNA is generally  detectable in upper and lower respiratory sp ecimens during the acute  phase of infection. The  expected result is Negative. Fact Sheet for Patients:  BoilerBrush.com.cyhttps://www.fda.gov/media/136312/download Fact Sheet for Healthcare Providers: https://pope.com/https://www.fda.gov/media/136313/download This test is not yet approved or cleared by the [place] FDA and has been authorized for detection and/or diagnosis of SARS-CoV-2 by FDA under an Emergency Use Authorization (EUA).  This EUA will remain in effect (meaning this test can be used) for the duration of the COVID-19 declaration under Section 564(b)(1) of the Act, 21 U.S.C. section 360bbb-3(b)(1), unless the authorization is terminated or revoked sooner. Performed at [facility], [address] [place], [place] [address]    No results found.  Review of Systems  All other systems reviewed and are negative.   There were no vitals taken for this visit. Physical Exam  Constitutional: He is oriented to person, place, and time. He appears well-developed and well-nourished. No distress.  HENT:  Head: Normocephalic and atraumatic.  Right Ear: External ear normal.  Left Ear: External ear normal.  Nose: Nose normal.  Mouth/Throat: Oropharynx is clear and moist.  Eyes: Pupils are equal, round, and reactive to light. Conjunctivae and EOM are normal.  Neck:  Left tail of parotid region with firm, tender edema.  Cardiovascular: Normal rate.  Respiratory: Effort normal.  Neurological: He is alert and oriented to person, place, and time. No cranial nerve deficit.  Skin: Skin is warm and dry.  Psychiatric: He has a normal mood and affect. His behavior is normal. Judgment and thought content normal.     Assessment/Plan Left parotid abscess  To OR for incision and drainage of left parotid abscess.  Christia Readingwight Haitham Dolinsky, MD 02/16/2019, 8:50 AM

## 2019-02-16 NOTE — Transfer of Care (Signed)
Immediate Anesthesia Transfer of Care Note  Patient: [name] [name]  Procedure(s) Performed: INCISION AND DRAINAGE ABSCESS (Left Neck)  Patient Location: PACU  Anesthesia Type:General  Level of Consciousness: awake, alert  and patient cooperative  Airway & Oxygen Therapy: Patient Spontanous Breathing  Post-op Assessment: Report given to RN and Post -op Vital signs reviewed and stable  Post vital signs: Reviewed and stable  Last Vitals:  Vitals Value Taken Time  BP 139/92 02/16/2019  9:53 AM  Temp    Pulse 58 02/16/2019  9:54 AM  Resp 13 02/16/2019  9:54 AM  SpO2 100 % 02/16/2019  9:54 AM  Vitals shown include unvalidated device data.  Last Pain:  Vitals:   02/16/19 0850  TempSrc: Oral  PainSc: 0-No pain      Patients Stated Pain Goal: 4 (02/16/19 0850)  Complications: No apparent anesthesia complications

## 2019-02-16 NOTE — Anesthesia Procedure Notes (Signed)
Procedure Name: Intubation Date/Time: 02/16/2019 9:03 AM Performed by: Elayne Snare, CRNA Pre-anesthesia Checklist: Patient identified, Emergency Drugs available, Suction available and Patient being monitored Patient Re-evaluated:Patient Re-evaluated prior to induction Oxygen Delivery Method: Circle System Utilized Preoxygenation: Pre-oxygenation with 100% oxygen Induction Type: IV induction and Rapid sequence Laryngoscope Size: Mac and 4 Grade View: Grade I Tube type: Oral Tube size: 7.5 mm Number of attempts: 1 Airway Equipment and Method: Stylet Placement Confirmation: ETT inserted through vocal cords under direct vision,  positive ETCO2 and breath sounds checked- equal and bilateral Secured at: 23 cm Tube secured with: Tape Dental Injury: Teeth and Oropharynx as per pre-operative assessment

## 2019-02-16 NOTE — Brief Op Note (Signed)
02/16/2019  9:47 AM  PATIENT:  [name]  23 y.o. male  PRE-OPERATIVE DIAGNOSIS:  neck abscess  POST-OPERATIVE DIAGNOSIS:  neck abscess  PROCEDURE:  Procedure(s): INCISION AND DRAINAGE ABSCESS (Left) Neck  SURGEON:  Surgeon(s) and Role:    Christia Reading, MD - Primary  PHYSICIAN ASSISTANT:   ASSISTANTS: none   ANESTHESIA:   general  EBL: Minimal  BLOOD ADMINISTERED:none  DRAINS: Penrose drain in the left neck   LOCAL MEDICATIONS USED:  LIDOCAINE   SPECIMEN:  Source of Specimen:  Left neck abscess culture  DISPOSITION OF SPECIMEN:  MICRO  COUNTS:  YES  TOURNIQUET:  * No tourniquets in log *  DICTATION: .Other Dictation: Dictation Number [id number]  PLAN OF CARE: Discharge to home after PACU  PATIENT DISPOSITION:  PACU - hemodynamically stable.   Delay start of Pharmacological VTE agent (>24hrs) due to surgical blood loss or risk of bleeding: no

## 2019-02-17 ENCOUNTER — Encounter (HOSPITAL_COMMUNITY): Payer: Self-pay | Admitting: Otolaryngology

## 2019-02-17 NOTE — Op Note (Signed)
NAME: [name], [name] MEDICAL RECORD HU:[id number] ACCOUNT [id number] DATE OF BIRTH:[birth year] FACILITY: [facility] LOCATION: [facility] PHYSICIAN:Georgianna Band Pearletha Alfred, MD  OPERATIVE REPORT  DATE OF PROCEDURE:  02/16/2019  PREOPERATIVE DIAGNOSIS:  Left parotid abscess.  POSTOPERATIVE DIAGNOSIS:  Left parotid abscess.  PROCEDURE:  Incision and drainage of left parotid abscess.   ANESTHESIA:  General endotracheal anesthesia.  COMPLICATIONS:  None.  INDICATIONS:  The patient is a 23 year old who has had over a week of swelling and pain in the left parotid region.  Imaging several days ago demonstrated a small fluid pocket.  Treatment with oral clindamycin initially helped, but swelling and pain have  worsened again.  The patient was brought to the operating room for surgical management.  FINDINGS:  Upon dissecting into the deep parotid region on the left side, purulent fluid was encountered and drained freely.  Cultures were sent.  DESCRIPTION OF PROCEDURE:  The patient was identified in the holding room, informed consent having been obtained including discussion of risks, benefits and alternatives.  The patient was brought to the operative suite and placed on the operating room  table in supine position.  Anesthesia was induced, and the patient was intubated by the anesthesia team without difficulty.  The left neck was prepped and draped in sterile fashion, and a vertical incision was marked inferior to the left ear and injected  with 1% lidocaine with 1:100,000 of epinephrine.  The patient was then prepped and draped in sterile fashion.  The incision was made with a 15-blade scalpel through the skin and then extended deeply using blunt dissection in the direction of the abscess  until the abscess cavity was entered.  Purulent fluid flowed freely.  Culture swabs were rubbed into the depths of the abscess.  The abscess cavity was then copiously irrigated with saline using a red rubber catheter.  A  1/4-inch Penrose drain was  placed in the depths of the abscess and secured to skin using a 2-0 nylon suture.  Drapes were then removed and the patient was cleaned off.  A fluff dressing was placed around the neck.  After extubation, the patient was moved to the recovery room in  stable condition.  LN/NUANCE  D:02/16/2019 T:02/16/2019 JOB:[id number]

## 2019-02-21 LAB — AEROBIC/ANAEROBIC CULTURE W GRAM STAIN (SURGICAL/DEEP WOUND)

## 2019-03-19 ENCOUNTER — Ambulatory Visit: Payer: Self-pay | Admitting: Infectious Diseases

## 2019-03-21 LAB — AFB CULTURE, BLOOD
MICRO NUMBER:: 468121
SPECIMEN QUALITY:: ADEQUATE

## 2019-03-28 ENCOUNTER — Emergency Department (HOSPITAL_COMMUNITY)
Admission: EM | Admit: 2019-03-28 | Discharge: 2019-03-28 | Disposition: A | Discharge status: Home / Self Care | Payer: Medicare Other | Attending: Emergency Medicine | Admitting: Emergency Medicine

## 2019-03-28 ENCOUNTER — Encounter (HOSPITAL_COMMUNITY): Payer: Self-pay | Admitting: Emergency Medicine

## 2019-03-28 ENCOUNTER — Other Ambulatory Visit: Payer: Self-pay

## 2019-03-28 DIAGNOSIS — Z21 Asymptomatic human immunodeficiency virus [HIV] infection status: Secondary | ICD-10-CM | POA: Diagnosis not present

## 2019-03-28 DIAGNOSIS — K029 Dental caries, unspecified: Secondary | ICD-10-CM | POA: Diagnosis not present

## 2019-03-28 DIAGNOSIS — K0889 Other specified disorders of teeth and supporting structures: Secondary | ICD-10-CM | POA: Diagnosis present

## 2019-03-28 DIAGNOSIS — F17210 Nicotine dependence, cigarettes, uncomplicated: Secondary | ICD-10-CM | POA: Diagnosis not present

## 2019-03-28 DIAGNOSIS — Z79899 Other long term (current) drug therapy: Secondary | ICD-10-CM | POA: Insufficient documentation

## 2019-03-28 DIAGNOSIS — K047 Periapical abscess without sinus: Secondary | ICD-10-CM | POA: Diagnosis not present

## 2019-03-28 MED ORDER — MUPIROCIN 2 % EX OINT: 1.0000 "application " | TOPICAL_OINTMENT | Freq: Two times a day (BID) | CUTANEOUS | 0 refills | Status: DC

## 2019-03-28 MED ORDER — CLINDAMYCIN HCL 300 MG PO CAPS: 300.0000 mg | ORAL_CAPSULE | Freq: Three times a day (TID) | ORAL | 0 refills | Status: DC

## 2019-03-28 NOTE — ED Triage Notes (Signed)
Pt c/o right facial swelling for several days. Reports not very painful.

## 2019-03-28 NOTE — Discharge Instructions (Addendum)
See your dentist on Monday as scheduled. Follow up with ENT if not improving with antibiotics and dental treatment.  Take Clindamycin as prescribed and complete the full course. Apply bactroban ointment to right check area- clean area before applying ointment.  Rinse with salt water after each meal.

## 2019-03-28 NOTE — ED Provider Notes (Signed)
[facility] DEPT Provider Note   CSN: [id number] Arrival date & time: 03/28/19  0272    History   Chief Complaint Chief Complaint  Patient presents with  . Oral Swelling    HPI [name] [name] is a 23 y.o. male.     23 year old patient with history of HIV presents with complaint of right cheek swelling and right upper dental pain.  Patient was seen at the end of May by ENT for left parotid gland abscess, treated with I&D and antibiotics.  States that this is similar due to the swelling in the right cheek however does not have the pain like they had experienced with the left cheek abscess.  Patient called their dentist and has an appointment on Monday however was concerned swelling may get worse before then and wanted to be seen.  Denies trauma, fever, drainage.  No other complaints or concerns.     Past Medical History:  Diagnosis Date  . Hepatitis B immune   . HIV disease ([place]) dx'd ~ 2012/2013  . Immune deficiency disorder ([place])   . 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 Seizures ([place]) 2017   "probably stress related" (05/24/2017)  . Syphilis     Patient Active Problem List   Diagnosis Date Noted  . 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 Enteropathogenic Escherichia coli infection 05/26/2017  . Acute renal failure (ARF) ([place]) 05/24/2017  . Abnormal brain MRI 02/07/2017  . Rash 11/15/2016  . Hypotension 11/05/2016  . Shingles 09/14/2016  . 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 Depression 09/08/2016  . HIV disease ([place])   . Seizure ([place]) 08/29/2016    Past Surgical History:  Procedure Laterality Date  . ANAL EXAMINATION UNDER ANESTHESIA    . INCISION AND DRAINAGE ABSCESS Left 02/16/2019   Procedure: INCISION AND DRAINAGE ABSCESS;  Surgeon: Melida Quitter, MD;  Location: [place];  Service: ENT;  Laterality: Left;  . WISDOM TOOTH EXTRACTION          Home  Medications    Prior to Admission medications   Medication Sig Start Date End Date Taking? Authorizing Provider  acetaminophen (TYLENOL) 500 MG tablet Take 1,000 mg by mouth every 6 (six) hours as needed for mild pain.    [provider]  bictegravir-emtricitabine-tenofovir AF (BIKTARVY) 50-200-25 MG TABS tablet Take 1 tablet by mouth daily. 02/05/19   Powers, Evern Core, MD  clindamycin (CLEOCIN) 300 MG capsule Take 1 capsule (300 mg total) by mouth 3 (three) times daily. 03/28/19   Tacy Learn, PA-C  HYDROcodone-acetaminophen (NORCO/VICODIN) 5-325 MG tablet Take 1-2 tablets by mouth every 6 (six) hours as needed for moderate pain. 02/16/19 02/16/20  Melida Quitter, MD  mupirocin ointment (BACTROBAN) 2 % Place 1 application into the nose 2 (two) times daily. 03/28/19   Tacy Learn, PA-C    Family History No family history on file.  Social History Social History   Tobacco Use  . Smoking status: Current Every Day Smoker    Packs/day: 0.50    Years: 6.00    Pack years: 3.00    Types: Cigarettes  . Smokeless tobacco: Never Used  Substance Use Topics  . Alcohol use: Yes    Comment: slim to none  . Drug use: Yes    Types: Marijuana     Allergies   Patient has no known allergies.   Review of Systems Review of Systems  Constitutional: Negative for  fever.  HENT: Positive for dental problem and facial swelling. Negative for ear pain, trouble swallowing and voice change.   Gastrointestinal: Negative for nausea and vomiting.  Musculoskeletal: Negative for neck pain.  Skin: Negative for rash and wound.  Allergic/Immunologic: Positive for immunocompromised state.  Neurological: Negative for headaches.  Hematological: Negative for adenopathy.  Psychiatric/Behavioral: Negative for confusion.  All other systems reviewed and are negative.    Physical Exam Updated Vital Signs BP 118/69 (BP Location: Left Arm)   Pulse 72   Temp 98.7 F (37.1 C) (Oral)   Resp 17   SpO2 95%    Physical Exam Vitals signs and nursing note reviewed.  Constitutional:      General: He is not in acute distress.    Appearance: He is well-developed. He is not diaphoretic.  HENT:     Head: Atraumatic.     Jaw: No trismus.      Nose: Nose normal.     Mouth/Throat:     Mouth: Mucous membranes are moist.     Pharynx: No oropharyngeal exudate or posterior oropharyngeal erythema.   Eyes:     Extraocular Movements: Extraocular movements intact.     Pupils: Pupils are equal, round, and reactive to light.  Neck:     Musculoskeletal: Neck supple. No muscular tenderness.  Pulmonary:     Effort: Pulmonary effort is normal.  Lymphadenopathy:     Cervical: No cervical adenopathy.  Skin:    General: Skin is warm and dry.     Findings: No erythema.  Neurological:     Mental Status: He is alert and oriented to person, place, and time.  Psychiatric:        Behavior: Behavior normal.      ED Treatments / Results  Labs (all labs ordered are listed, but only abnormal results are displayed) Labs Reviewed - No data to display  EKG None  Radiology No results found.  Procedures Procedures (including critical care time)  Medications Ordered in ED Medications - No data to display   Initial Impression / Assessment and Plan / ED Course  I have reviewed the triage vital signs and the nursing notes.  Pertinent labs & imaging results that were available during my care of the patient were reviewed by me and considered in my medical decision making (see chart for details).  Clinical Course as of Mar 27 1118  Thu Mar 28, 2019  [id number] 23 year old patient with report of right upper dental pain and swelling of the right cheek.  Patient was recently treated for left parotid gland abscess by ENT.  Patient will be started on clindamycin for dental abscess with consideration for possible parotid gland infection developing.  Patient plans to follow-up with his dentist on Monday, advised to see  his ENT if not improving.   [LM]    Clinical Course User Index [LM] Jeannie FendMurphy, Daliana Leverett A, PA-C      Final Clinical Impressions(s) / ED Diagnoses   Final diagnoses:  Dental abscess    ED Discharge Orders         Ordered    clindamycin (CLEOCIN) 300 MG capsule  3 times daily     03/28/19 0929    mupirocin ointment (BACTROBAN) 2 %  2 times daily     03/28/19 0929           Jeannie FendMurphy, Lizza Huffaker A, PA-C 03/28/19 1119    Tegeler, Canary Brimhristopher J, MD 03/28/19 1537

## 2019-04-08 ENCOUNTER — Other Ambulatory Visit: Payer: Self-pay

## 2019-04-08 ENCOUNTER — Ambulatory Visit (INDEPENDENT_AMBULATORY_CARE_PROVIDER_SITE_OTHER): Payer: Medicare Other | Admitting: Infectious Diseases

## 2019-04-08 VITALS — BP 128/85 | HR 89 | Temp 98.0°F | Ht 79.0 in | Wt 173.0 lb

## 2019-04-08 DIAGNOSIS — L0292 Furuncle, unspecified: Secondary | ICD-10-CM

## 2019-04-08 DIAGNOSIS — B20 Human immunodeficiency virus [HIV] disease: Secondary | ICD-10-CM

## 2019-04-08 MED ORDER — BICTEGRAVIR-EMTRICITAB-TENOFOV 50-200-25 MG PO TABS: 1.0000 | ORAL_TABLET | Freq: Every day | ORAL | 1 refills | Status: DC

## 2019-04-08 NOTE — Patient Instructions (Signed)
Return to clinic in 4 weeks for lab draw. Return to clinic in 6 weeks for appointment with Dr. Prince Rome. Continue biktarvy 1 tab daily, missing no doses (key chain pill holder given to patient at today's visit).

## 2019-04-08 NOTE — Progress Notes (Signed)
[name] [name]  [id number]  [birth year]    HPI: The patient is a 23 y.o. y/o AA male to male pre-op transgender patient presenting today for a routine return visit for HIV infection. He was last seen in our clinic on 02/05/2019. Her most recent CD4 count was 33 and concurrent HIV viral load was 303,000 copies on no ARVs. Her CD4 nadir is also 33. She has a long-standing h/o non-compliance with treatment prior to evaluation in our clinic. She had no baseline ARV mutations though noted on her genotype. She was started on biktarvy 1 tab daily at her last visit. She admits to missing his medications "a couple of times" after staying with friends and leaving her medications behind. Unfortunately, she did not have her labs drawn prior to today's visit. Shortly after her visit with [place] in May, she was admitted to [facility] for a parotid gland abscess, which was debrided and managed by Dr. Redmond Baseman. 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 She also is concerned about a sore tooth for which she is taking an unknown antibiotic. She has no additional complaints today.   Past Medical History:  Diagnosis Date  . Hepatitis B immune   . HIV disease ([place]) dx'd ~ 2012/2013  . Immune deficiency disorder ([place])   . 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 Seizures ([place]) 2017   "probably stress related" (05/24/2017)  . Syphilis     Past Surgical History:  Procedure Laterality Date  . ANAL EXAMINATION UNDER ANESTHESIA    . INCISION AND DRAINAGE ABSCESS Left 02/16/2019   Procedure: INCISION AND DRAINAGE ABSCESS;  Surgeon: Melida Quitter, MD;  Location: [facility];  Service: ENT;  Laterality: Left;  . WISDOM TOOTH EXTRACTION       No family history on file.   Social History   Tobacco Use  . Smoking status: Current Every Day Smoker    Packs/day: 0.50    Years: 6.00    Pack years: 3.00    Types: Cigarettes  . Smokeless tobacco: Never Used  Substance Use  Topics  . Alcohol use: Yes    Comment: slim to none  . Drug use: Yes    Types: Marijuana      reports being sexually active. He reports using the following method of birth control/protection: Condom.   Outpatient Medications Prior to Visit  Medication Sig Dispense Refill  . acetaminophen (TYLENOL) 500 MG tablet Take 1,000 mg by mouth every 6 (six) hours as needed for mild pain.    . bictegravir-emtricitabine-tenofovir AF (BIKTARVY) 50-200-25 MG TABS tablet Take 1 tablet by mouth daily. 30 tablet 1  . HYDROcodone-acetaminophen (NORCO/VICODIN) 5-325 MG tablet Take 1-2 tablets by mouth every 6 (six) hours as needed for moderate pain. 12 tablet 0  . clindamycin (CLEOCIN) 300 MG capsule Take 1 capsule (300 mg total) by mouth 3 (three) times daily. 21 capsule 0  . mupirocin ointment (BACTROBAN) 2 % Place 1 application into the nose 2 (two) times daily. 22 g 0   No facility-administered medications prior to visit.      No Known Allergies   Review of Systems  Constitutional: Positive for fatigue. Negative for chills and fever.  HENT: Positive for dental problem and facial swelling. Negative for congestion, hearing loss, rhinorrhea and sinus pressure.   Eyes: Negative for photophobia, pain, redness and visual disturbance.  Respiratory: Negative for apnea, cough, shortness of breath and wheezing.   Cardiovascular: Negative for chest pain  and palpitations.  Gastrointestinal: Negative for abdominal pain, constipation, diarrhea, nausea and vomiting.  Endocrine: Negative for cold intolerance, heat intolerance, polydipsia and polyuria.  Genitourinary: Negative for decreased urine volume, dysuria, frequency, hematuria and testicular pain.  Musculoskeletal: Negative for back pain, myalgias and neck pain.  Skin: Negative for pallor and rash.  Allergic/Immunologic: Positive for immunocompromised state.  Neurological: Negative for dizziness, seizures, syncope, speech difficulty and light-headedness.   Hematological: Does not bruise/bleed easily.  Psychiatric/Behavioral: Negative for agitation and hallucinations. The patient is not nervous/anxious.      Vitals:   04/08/19 0928  BP: 128/85  Pulse: 89  Temp: 98 F (36.7 C)     Physical Exam Gen: pleasant, NAD, A&Ox 3 Head: NCAT, no temporal wasting evident EENT: PERRL, EOMI, MMM, adequate dentition, minimal facial swelling today Neck: supple, no JVD CV: NRRR, no murmurs evident Pulm: CTA bilaterally, no wheeze or retractions Abd: soft, NTND, +BS Extrems: no LE edema, 2+ pulses Skin: no rashes, adequate skin turgor Neuro: CN II-XII grossly intact, no focal neurologic deficits appreciated, gait was normal, A&Ox 3   Labs: Lab Results  Component Value Date   HIV1RNAQUANT 303,000 (H) 01/21/2019   HIV1RNAQUANT 248,000 04/26/2018   HIV1RNAQUANT 41,300 05/24/2017     Lab Results  Component Value Date   CD4TCELL 4 (L) 01/21/2019   CD4TABS 33 (L) 01/21/2019     Lab Results  Component Value Date   WBC 4.0 02/12/2019   HGB 11.6 (L) 02/12/2019   HCT 35.5 (L) 02/12/2019   MCV 96.2 02/12/2019   PLT 238 02/12/2019       Chemistry      Component Value Date/Time   NA 139 02/12/2019 1922   K 3.2 (L) 02/12/2019 1922   CL 106 02/12/2019 1922   CO2 25 02/12/2019 1922   BUN 15 02/12/2019 1922   CREATININE 0.90 02/12/2019 1922   CREATININE 1.01 01/21/2019 1135      Component Value Date/Time   CALCIUM 8.5 (L) 02/12/2019 1922   ALKPHOS 60 02/12/2019 1922   AST 13 (L) 02/12/2019 1922   ALT 10 02/12/2019 1922   BILITOT 0.5 02/12/2019 1922        Assessment/Plan: The patient is a 23 year old African-American male to male transgender individual with genital HSV, ?seizure d/o, multiple STIs, and history of medical noncompliance presenting for HIV care.  HIV/AIDS -patient has a longstanding history of medical noncompliance with antiretroviral treatment over the last 5+ years since antiretrovirals were attempted to be  initiated.  Patient has multiple reasons for this, but appears to revolve around significant amount of denial regarding her diagnosis and gender identity.  Her CD4 count has now declined to a nadir of 33 while her HIV viral load steadily increased to 303,000 copies while on no antiretrovirals for 8 to 9 months.  The patient did briefly take Biktarvy and tolerated this well, so restarted Biktarvy 1 tab daily as baseline genotype here did not show new ARV mutations.    While she states that she has taken her Biktarvy minus approximately 2 days since her last visit, she did not have her labs drawn ahead of her visit today.  We will repeat the patient's HIV viral load today and will tentatively schedule the patient for a 6 week follow-up.  We will repeat both patient's CD4 count and HIV viral load prior to next visit.  I have stressed to her the need to return 2 weeks prior to her appointment to have repeat labs drawn.  To assist with compliance, patient was given a keychain pill holder to take medications when she is traveling for 1 to 2 days, so she always has a supply of her medications with her without having taken a full pill bottle.  Possible odontogenic abscess -the patient appears to have responded well to recent penicillin and debridement to her parotid gland.  Clinically, I do not see any signs of a recurrent abscess, but if the patient has had a significant reduction in her HIV viral load, will work to have her scheduled in our [place] clinic soon for reassessment.  Genital HSV -patient denies any acute flares the present time.  Due to the patient's desire for medical simplicity, will defer adding Valtrex at this time unless the patient develops further flares, moving forward.  Health maintenance -Bactrim double strength 1 tab daily will be continued for PJP/toxoplasma prophylaxis until her CD4 count is > 200. AFB blood culture drawn at last visit was negative, thus R/O disseminated MAC.  Given patient's CD4 count of less than 50 and simultaneously start azithromycin 1 g p.o. weekly for MAC prophylaxis until her CD4 count is above 100. Pneumococcal vaccine series UTD with prevnar given in 2013 and pneumovax given in 2017. Tdap vaccine next due in 2024. RPR and urine GC/chlamydia screens all negative in 12/2018. Vaccination for hepatitis A & B initiated today. Annual TB screening with quantiferon was negative in 12/2018. She is up to date with his annual flu vaccine given in 10/19. Check FLP next in 12/2019 for annual cholesterol screening (pt reminded to be fasting at that time). HPV vaccine series completed in 2014t. Pt has been referred for annual dental cleaning once his HIV viremia is fully suppressed (or sooner if dental condition worsens). Condoms and water based lubricants were advised with all sexual encounters.

## 2019-04-19 LAB — QUANTIFERON-TB GOLD PLUS
Mitogen-NIL: 7.31 IU/mL
NIL: 0.02 IU/mL
QuantiFERON-TB Gold Plus: NEGATIVE
TB1-NIL: 0 IU/mL
TB2-NIL: 0 IU/mL

## 2019-04-19 LAB — HIV RNA, RTPCR W/R GT (RTI, PI,INT)
HIV 1 RNA Quant: 29 copies/mL — ABNORMAL HIGH
HIV-1 RNA Quant, Log: 1.46 Log copies/mL — ABNORMAL HIGH

## 2019-05-16 ENCOUNTER — Ambulatory Visit: Payer: Medicaid Other | Admitting: Infectious Diseases

## 2019-06-20 ENCOUNTER — Telehealth: Payer: Self-pay

## 2019-06-20 ENCOUNTER — Other Ambulatory Visit: Payer: Self-pay | Admitting: Infectious Diseases

## 2019-06-20 NOTE — Telephone Encounter (Signed)
Attempted to call patient to reschedule missed appointment with Md. Left voicemail requesting patient call office back for appointment. [place]

## 2019-06-27 ENCOUNTER — Encounter: Payer: Self-pay | Admitting: Infectious Diseases

## 2019-06-27 NOTE — Patient Instructions (Signed)
Return to clinic in 6 weeks for appointment with Dr. Prince Rome. Return to clinic in 4 weeks for repeat labs. Take biktarvy, bactrim every day. Begin azithromycin once weekly.

## 2019-08-15 ENCOUNTER — Other Ambulatory Visit: Payer: Self-pay | Admitting: Infectious Diseases

## 2019-11-13 ENCOUNTER — Other Ambulatory Visit: Payer: Self-pay

## 2019-11-13 MED ORDER — BIKTARVY 50-200-25 MG PO TABS: 1.0000 | ORAL_TABLET | Freq: Every day | ORAL | 1 refills | Status: DC

## 2020-01-15 ENCOUNTER — Telehealth: Payer: Self-pay

## 2020-01-15 NOTE — Telephone Encounter (Signed)
Patient called office today requesting release of information to transfer care to local provider in [place]. Has moved to [address].  Updated patient's address. Will mail release as requested. Lorenso Courier, [place]

## 2020-01-29 ENCOUNTER — Other Ambulatory Visit: Payer: Self-pay

## 2020-01-29 ENCOUNTER — Other Ambulatory Visit: Payer: Medicare Other

## 2020-01-29 DIAGNOSIS — B20 Human immunodeficiency virus [HIV] disease: Secondary | ICD-10-CM

## 2020-01-30 LAB — T-HELPER CELL (CD4) - (RCID CLINIC ONLY)
CD4 % Helper T Cell: 5 % — ABNORMAL LOW (ref 33–65)
CD4 T Cell Abs: 155 /uL — ABNORMAL LOW (ref 400–1790)

## 2020-01-31 LAB — CBC WITH DIFFERENTIAL/PLATELET
Absolute Monocytes: 443 cells/uL (ref 200–950)
Basophils Absolute: 31 cells/uL (ref 0–200)
Basophils Relative: 0.3 %
Eosinophils Absolute: 361 cells/uL (ref 15–500)
Eosinophils Relative: 3.5 %
HCT: 39.4 % (ref 38.5–50.0)
Hemoglobin: 13.6 g/dL (ref 13.2–17.1)
Lymphs Abs: 3255 cells/uL (ref 850–3900)
MCH: 32.2 pg (ref 27.0–33.0)
MCHC: 34.5 g/dL (ref 32.0–36.0)
MCV: 93.4 fL (ref 80.0–100.0)
MPV: 10 fL (ref 7.5–12.5)
Monocytes Relative: 4.3 %
Neutro Abs: 6211 cells/uL (ref 1500–7800)
Neutrophils Relative %: 60.3 %
Platelets: 325 10*3/uL (ref 140–400)
RBC: 4.22 10*6/uL (ref 4.20–5.80)
RDW: 13 % (ref 11.0–15.0)
Total Lymphocyte: 31.6 %
WBC: 10.3 10*3/uL (ref 3.8–10.8)

## 2020-01-31 LAB — COMPLETE METABOLIC PANEL WITH GFR
AG Ratio: 0.8 (calc) — ABNORMAL LOW (ref 1.0–2.5)
ALT: 11 U/L (ref 9–46)
AST: 16 U/L (ref 10–40)
Albumin: 4.2 g/dL (ref 3.6–5.1)
Alkaline phosphatase (APISO): 81 U/L (ref 36–130)
BUN: 12 mg/dL (ref 7–25)
CO2: 30 mmol/L (ref 20–32)
Calcium: 9.5 mg/dL (ref 8.6–10.3)
Chloride: 102 mmol/L (ref 98–110)
Creat: 1.12 mg/dL (ref 0.60–1.35)
GFR, Est African American: 105 mL/min/{1.73_m2} (ref >=60)
GFR, Est Non African American: 91 mL/min/{1.73_m2} (ref >=60)
Globulin: 5 g/dL (calc) — ABNORMAL HIGH (ref 1.9–3.7)
Glucose, Bld: 95 mg/dL (ref 65–99)
Potassium: 4.1 mmol/L (ref 3.5–5.3)
Sodium: 138 mmol/L (ref 135–146)
Total Bilirubin: 0.4 mg/dL (ref 0.2–1.2)
Total Protein: 9.2 g/dL — ABNORMAL HIGH (ref 6.1–8.1)

## 2020-01-31 LAB — HIV-1 RNA QUANT-NO REFLEX-BLD
HIV 1 RNA Quant: 6330 copies/mL — ABNORMAL HIGH
HIV-1 RNA Quant, Log: 3.8 Log copies/mL — ABNORMAL HIGH

## 2020-02-13 ENCOUNTER — Encounter: Payer: Medicare Other | Admitting: Family

## 2020-02-13 ENCOUNTER — Encounter: Payer: Self-pay | Admitting: Family

## 2020-02-13 ENCOUNTER — Ambulatory Visit (INDEPENDENT_AMBULATORY_CARE_PROVIDER_SITE_OTHER): Payer: Medicare Other | Admitting: Family

## 2020-02-13 ENCOUNTER — Other Ambulatory Visit: Payer: Self-pay

## 2020-02-13 VITALS — BP 105/67 | HR 70 | Temp 98.5°F | Ht 79.0 in | Wt 167.0 lb

## 2020-02-13 DIAGNOSIS — L02419 Cutaneous abscess of limb, unspecified: Secondary | ICD-10-CM

## 2020-02-13 DIAGNOSIS — B20 Human immunodeficiency virus [HIV] disease: Secondary | ICD-10-CM | POA: Diagnosis present

## 2020-02-13 DIAGNOSIS — Z113 Encounter for screening for infections with a predominantly sexual mode of transmission: Secondary | ICD-10-CM

## 2020-02-13 MED ORDER — SULFAMETHOXAZOLE-TRIMETHOPRIM 800-160 MG PO TABS: 1.0000 | ORAL_TABLET | Freq: Every day | ORAL | 3 refills | Status: DC

## 2020-02-13 MED ORDER — BIKTARVY 50-200-25 MG PO TABS: 1.0000 | ORAL_TABLET | Freq: Every day | ORAL | 3 refills | Status: DC

## 2020-02-13 NOTE — Patient Instructions (Signed)
Nice to see you.  Continue to take your Biktarvy and Bactrim daily.  Refills have been sent to the pharmacy.  Warm compresses to your underarm.  Plan for follow-up in 1 month or sooner if needed with lab work 1 to 2 weeks prior to your appointment.  Have a great day and stay safe!

## 2020-02-13 NOTE — Progress Notes (Signed)
Subjective:    Patient ID: [name] [name], male    DOB: [birth year], 24 y.o.   MRN: [id number]  Chief Complaint  Patient presents with  . Follow-up    B20 Rt upper arm have abscess     HPI:  [name] [name] is a 24 y.o. transgender male with HIV disease who was last seen in the office on 04/08/2019 with a viral load that was undetectable and CD4 count of 33.  She was maintained on Bactrim for OI prophylaxis.  Most recent blood work completed on 5/5 with viral load of 6330 and CD4 count of 155.  She is here today for routine follow-up.  [name] [name] has improved adherence with good tolerance to her ART regimen of Biktarvy.  She was off her medication approximately 1 month prior to lab work and has been fairly consistent with her medication since that time frame.  Has a lesion located under her right axilla described as a lesion which she was seen at urgent care and given prescription for antibiotics which she has not started taking. Denies fevers, chills, night sweats, headaches, changes in vision, neck pain/stiffness, nausea, diarrhea, vomiting, lesions or rashes.   [name] [name] has no problems obtaining her medication from the pharmacy and remains covered through Medicare.  Denies feelings of being down, depressed, or hopeless recently.  She continues to smoke marijuana daily and tobacco at a rate of about 1/2 pack/day.  Alcohol consumption is seldom.  Declines condoms today.   No Known Allergies    Outpatient Medications Prior to Visit  Medication Sig Dispense Refill  . bictegravir-emtricitabine-tenofovir AF (BIKTARVY) 50-200-25 MG TABS tablet Take 1 tablet by mouth daily. 30 tablet 1  . acetaminophen (TYLENOL) 500 MG tablet Take 1,000 mg by mouth every 6 (six) hours as needed for mild pain.    [place] Kitchen HYDROcodone-acetaminophen (NORCO/VICODIN) 5-325 MG tablet Take 1-2 tablets by mouth every 6 (six) hours as needed for moderate pain. (Patient not taking: Reported on 02/13/2020) 12 tablet 0  .  penicillin v potassium (VEETID) 500 MG tablet Take 1 tablet by mouth every 6 (six) hours.     No facility-administered medications prior to visit.     Past Medical History:  Diagnosis Date  . Acute kidney failure ([facility]) 04/2017  . Anal fissure 2017  . C. difficile colitis 10/2016  . Candida esophagitis ([facility]) 2017  . Cellulitis of hand 07/2016  . Chlamydia    multiple episodes  . 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 Genital HSV 2017  . Hepatitis B immune   . HIV disease ([facility])    dx'ed in 2013  . Immune deficiency disorder ([facility])   . 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 Rectal gonorrhea    multiple episodes  . Seizures ([facility]) 2017   "probably stress related" (05/24/2017)  . Syphilis   . Syphilis, secondary 11/2011   tx'ed with IM PCN x 3     Past Surgical History:  Procedure Laterality Date  . ANAL EXAMINATION UNDER ANESTHESIA    . INCISION AND DRAINAGE ABSCESS Left 02/16/2019   Procedure: INCISION AND DRAINAGE ABSCESS;  Surgeon: Christia Reading, MD;  [facility];  Service: ENT;  Laterality: Left;  . WISDOM TOOTH EXTRACTION         Review of Systems  Constitutional: Negative for appetite change, chills, fatigue, fever and unexpected weight change.  Eyes: Negative for visual disturbance.  Respiratory: Negative for cough, chest tightness, shortness of  breath and wheezing.   Cardiovascular: Negative for chest pain and leg swelling.  Gastrointestinal: Negative for abdominal pain, constipation, diarrhea, nausea and vomiting.  Genitourinary: Negative for dysuria, flank pain, frequency, genital sores, hematuria and urgency.  Skin: Negative for rash.  Allergic/Immunologic: Negative for immunocompromised state.  Neurological: Negative for dizziness and headaches.      Objective:    BP 105/67   Pulse 70   Temp 98.5 F (36.9 C)   Ht 6\' 7"  (2.007 m)   Wt 167 lb (75.8 kg)   SpO2 99%   BMI 18.81 kg/m  Nursing note and  vital signs reviewed.  Physical Exam Constitutional:      General: He is not in acute distress.    Appearance: He is well-developed.  Eyes:     Conjunctiva/sclera: Conjunctivae normal.  Cardiovascular:     Rate and Rhythm: Normal rate and regular rhythm.     Heart sounds: Normal heart sounds. No murmur. No friction rub. No gallop.   Pulmonary:     Effort: Pulmonary effort is normal. No respiratory distress.     Breath sounds: Normal breath sounds. No wheezing or rales.  Chest:     Chest wall: No tenderness.  Abdominal:     General: Bowel sounds are normal.     Palpations: Abdomen is soft.     Tenderness: There is no abdominal tenderness.  Musculoskeletal:     Cervical back: Neck supple.  Lymphadenopathy:     Cervical: No cervical adenopathy.  Skin:    General: Skin is warm and dry.     Findings: No rash.  Neurological:     Mental Status: He is alert and oriented to person, place, and time.  Psychiatric:        Behavior: Behavior normal.        Thought Content: Thought content normal.        Judgment: Judgment normal.     Depression screen [facility] 2/9 04/08/2019 02/05/2019 11/15/2016 09/07/2016 09/07/2016  Decreased Interest 0 0 0 0 0  Down, Depressed, Hopeless 0 0 0 2 1  PHQ - 2 Score 0 0 0 2 1  Altered sleeping - - - 1 -  Tired, decreased energy - - - 1 -  Change in appetite - - - 0 -  Feeling bad or failure about yourself  - - - 1 -  Trouble concentrating - - - 1 -  Moving slowly or fidgety/restless - - - 3 -  Suicidal thoughts - - - 1 -  PHQ-9 Score - - - 10 -  Difficult doing work/chores - - - Somewhat difficult -       Assessment & Plan:    Patient Active Problem List   Diagnosis Date Noted  . Axillary abscess 02/13/2020  . 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 Enteropathogenic Escherichia coli infection 05/26/2017  . Acute renal failure (ARF) ([place]) 05/24/2017  . Abnormal brain MRI 02/07/2017  . Rash 11/15/2016  .  Hypotension 11/05/2016  . Shingles 09/14/2016  . 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 Depression 09/08/2016  . AIDS (acquired immune deficiency syndrome) ([place])   . Seizure ([place]) 08/29/2016     Problem List Items Addressed This Visit      Other   AIDS (acquired immune deficiency syndrome) ([place]) - Primary    [name] [name] has poorly controlled HIV/AIDS with less than optimal adherence to her ART regimen of Biktarvy.  This was secondary to a recent break-up.  No signs/symptoms of opportunistic infection or progressive HIV disease.  Discussed importance of taking medications daily as prescribed to prevent future progression or opportunistic infection.  She remains at risk for opportunistic infection with CD4 count of 155 albeit improved from previous.  Continue current dose of Biktarvy supplemented with Bactrim for OI prophylaxis.  Plan for follow-up in 1 month or sooner if needed with lab work on the same day.      Relevant Medications   sulfamethoxazole-trimethoprim (BACTRIM DS) 800-160 MG tablet   bictegravir-emtricitabine-tenofovir AF (BIKTARVY) 50-200-25 MG TABS tablet   Other Relevant Orders   Comprehensive metabolic panel   T-helper cell (CD4)- ([facility] clinic only)   HIV-1 RNA quant-no reflex-bld   Axillary abscess    [name] [name] has a axillary abscess likely staph infection.  Recommend conservative treatment with warm compresses.  If unsuccessful may need to consider incision and drainage.  Recommend pursuing antibiotics previously prescribed.  Follow-up as needed       Other Visit Diagnoses    Screening for STDs (sexually transmitted diseases)       Relevant Orders   RPR       I have discontinued [name] Custard L. Arrick "LaShea"'s acetaminophen, HYDROcodone-acetaminophen, and penicillin v potassium. I am also having [name] [name] start on sulfamethoxazole-trimethoprim. Additionally, I am having [name] [name] maintain his Biktarvy.   Meds ordered this encounter  Medications  . sulfamethoxazole-trimethoprim  (BACTRIM DS) 800-160 MG tablet    Sig: Take 1 tablet by mouth daily.    Dispense:  30 tablet    Refill:  3    Order Specific Question:   Supervising Provider    Answer:   Judyann Munson [[id number]]  . bictegravir-emtricitabine-tenofovir AF (BIKTARVY) 50-200-25 MG TABS tablet    Sig: Take 1 tablet by mouth daily.    Dispense:  30 tablet    Refill:  3    Order Specific Question:   Supervising Provider    Answer:   Judyann Munson [[id number]]     Follow-up: Return in about 1 month (around 03/15/2020), or if symptoms worsen or fail to improve.   Marcos Eke, MSN, FNP-C Nurse Practitioner [facility] Main number: [phone]

## 2020-02-13 NOTE — Assessment & Plan Note (Addendum)
[name] [name] has poorly controlled HIV/AIDS with less than optimal adherence to her ART regimen of Biktarvy.  This was secondary to a recent break-up.  No signs/symptoms of opportunistic infection or progressive HIV disease.  Discussed importance of taking medications daily as prescribed to prevent future progression or opportunistic infection.  She remains at risk for opportunistic infection with CD4 count of 155 albeit improved from previous.  Continue current dose of Biktarvy supplemented with Bactrim for OI prophylaxis.  Plan for follow-up in 1 month or sooner if needed with lab work on the same day.

## 2020-02-13 NOTE — Assessment & Plan Note (Signed)
[name] [name] has a axillary abscess likely staph infection.  Recommend conservative treatment with warm compresses.  If unsuccessful may need to consider incision and drainage.  Recommend pursuing antibiotics previously prescribed.  Follow-up as needed

## 2020-03-03 ENCOUNTER — Other Ambulatory Visit: Payer: Medicare Other

## 2020-03-18 ENCOUNTER — Encounter: Payer: Medicare Other | Admitting: Family

## 2020-05-18 ENCOUNTER — Other Ambulatory Visit: Payer: Medicare Other

## 2020-05-18 ENCOUNTER — Other Ambulatory Visit (HOSPITAL_COMMUNITY)
Admission: RE | Admit: 2020-05-18 | Discharge: 2020-05-18 | Disposition: A | Discharge status: Home / Self Care | Payer: Medicare Other | Source: Ambulatory Visit | Attending: Family | Admitting: Family

## 2020-05-18 ENCOUNTER — Other Ambulatory Visit: Payer: Self-pay

## 2020-05-18 DIAGNOSIS — Z113 Encounter for screening for infections with a predominantly sexual mode of transmission: Secondary | ICD-10-CM

## 2020-05-18 DIAGNOSIS — B20 Human immunodeficiency virus [HIV] disease: Secondary | ICD-10-CM | POA: Insufficient documentation

## 2020-05-18 DIAGNOSIS — Z79899 Other long term (current) drug therapy: Secondary | ICD-10-CM

## 2020-05-19 LAB — URINE CYTOLOGY ANCILLARY ONLY
Chlamydia: NEGATIVE
Comment: NEGATIVE
Comment: NORMAL
Neisseria Gonorrhea: NEGATIVE

## 2020-05-19 LAB — T-HELPER CELL (CD4) - (RCID CLINIC ONLY)
CD4 % Helper T Cell: 8 % — ABNORMAL LOW (ref 33–65)
CD4 T Cell Abs: 213 /uL — ABNORMAL LOW (ref 400–1790)

## 2020-05-20 ENCOUNTER — Telehealth: Payer: Self-pay | Admitting: *Deleted

## 2020-05-20 LAB — LIPID PANEL
Cholesterol: 148 mg/dL (ref <200)
HDL: 28 mg/dL — ABNORMAL LOW (ref >=40)
LDL Cholesterol (Calc): 96 mg/dL (calc)
Non-HDL Cholesterol (Calc): 120 mg/dL (calc) (ref <130)
Total CHOL/HDL Ratio: 5.3 (calc) — ABNORMAL HIGH (ref <5.0)
Triglycerides: 147 mg/dL (ref <150)

## 2020-05-20 LAB — HIV-1 RNA QUANT-NO REFLEX-BLD
HIV 1 RNA Quant: 122 Copies/mL — ABNORMAL HIGH
HIV-1 RNA Quant, Log: 2.09 Log cps/mL — ABNORMAL HIGH

## 2020-05-20 LAB — RPR: RPR Ser Ql: REACTIVE — AB

## 2020-05-20 LAB — RPR TITER: RPR Titer: 1:64 {titer} — ABNORMAL HIGH

## 2020-05-20 LAB — FLUORESCENT TREPONEMAL AB(FTA)-IGG-BLD: Fluorescent Treponemal ABS: REACTIVE — AB

## 2020-05-20 NOTE — Telephone Encounter (Signed)
LaShea will need 2.4 million units of bicillin IM weekly x3 as the last RPR was over 1 year ago.  Thanks!

## 2020-05-20 NOTE — Telephone Encounter (Signed)
Notified patient, scheduled her for injections starting 8/31 (patient unable to come before then). Advised patient to abstain from sex until her treatment has been completed.  Patient disconnected the call before confirming appointment.

## 2020-05-20 NOTE — Telephone Encounter (Signed)
Albert at [facility] calling regarding lab results and plan. Please advise. Andree Coss, RN

## 2020-05-26 ENCOUNTER — Ambulatory Visit (INDEPENDENT_AMBULATORY_CARE_PROVIDER_SITE_OTHER): Payer: Medicare Other

## 2020-05-26 ENCOUNTER — Other Ambulatory Visit: Payer: Self-pay

## 2020-05-26 DIAGNOSIS — A539 Syphilis, unspecified: Secondary | ICD-10-CM | POA: Diagnosis not present

## 2020-05-26 MED ORDER — PENICILLIN G BENZATHINE 1200000 UNIT/2ML IM SUSP
1.2000 10*6.[IU] | Freq: Once | INTRAMUSCULAR | Status: AC
  Administered 2020-05-26: 1.2 10*6.[IU] via INTRAMUSCULAR

## 2020-05-26 NOTE — Progress Notes (Signed)
Patient here at [facility] for nurse visit due to reactive RPR. Patient treated with 2.4 million units of bicillin IM 1 of 3 weekly infections. Verbal orders received per Marcos Eke, NP. Chaperone present in the room.  Patient verbalized understanding of abstaining from sexual contact post 10 day of final weekly treatment. Patient made aware that partners can be tested and treated at the HD.  DIS also made aware that patient came into office today for first weekly injection. Patient tolerated injections well. Refused condoms. Patient will return on 9/7 for 2 of 3 weekly injection.  Valarie Cones

## 2020-06-02 ENCOUNTER — Ambulatory Visit (INDEPENDENT_AMBULATORY_CARE_PROVIDER_SITE_OTHER): Payer: Medicare Other | Admitting: Family

## 2020-06-02 ENCOUNTER — Other Ambulatory Visit: Payer: Self-pay

## 2020-06-02 ENCOUNTER — Encounter: Payer: Self-pay | Admitting: Family

## 2020-06-02 VITALS — BP 137/80 | HR 67 | Wt 178.0 lb

## 2020-06-02 DIAGNOSIS — Z0000 Encounter for general adult medical examination without abnormal findings: Secondary | ICD-10-CM | POA: Diagnosis not present

## 2020-06-02 DIAGNOSIS — B20 Human immunodeficiency virus [HIV] disease: Secondary | ICD-10-CM | POA: Diagnosis present

## 2020-06-02 DIAGNOSIS — A528 Late syphilis, latent: Secondary | ICD-10-CM | POA: Diagnosis not present

## 2020-06-02 DIAGNOSIS — A539 Syphilis, unspecified: Secondary | ICD-10-CM | POA: Insufficient documentation

## 2020-06-02 MED ORDER — PENICILLIN G BENZATHINE 1200000 UNIT/2ML IM SUSP
1.2000 10*6.[IU] | Freq: Once | INTRAMUSCULAR | Status: AC
  Administered 2020-06-02: 1.2 10*6.[IU] via INTRAMUSCULAR

## 2020-06-02 MED ORDER — BIKTARVY 50-200-25 MG PO TABS: 1.0000 | ORAL_TABLET | Freq: Every day | ORAL | 3 refills | Status: DC

## 2020-06-02 MED ORDER — SULFAMETHOXAZOLE-TRIMETHOPRIM 800-160 MG PO TABS: 1.0000 | ORAL_TABLET | Freq: Every day | ORAL | 3 refills | Status: DC

## 2020-06-02 NOTE — Progress Notes (Signed)
Subjective:    Patient ID: [name] [name], adult    DOB: 21-May-1996, 24 y.o.   MRN: [id number]  Chief Complaint  Patient presents with  . Follow-up    No questions or concerns     HPI:  [name] [name] is a 24 y.o. adult with HIV/AIDS with risk factors for acquiring HIV including MSM who was last seen in the office on 02/13/2020 with poorly controlled virus and CD4 count of 155 with viral load of 6330.  Most recent blood work completed on 05/18/2020 with a CD4 count of 213 and viral load of 122.  RPR was reactive for syphilis with 1: 64.  STI testing for gonorrhea and chlamydia were negative.  Started on Bicillin 2,400,000 units last week and due for second dose today.  Here today for routine follow-up.  [name] [name] continues to take her Biktarvy daily as prescribed supplemented with Bactrim for OI prophylaxis.  Overall feeling well today with no new concerns/complaints.  Underlined the importance of taking medication as prescribed. Denies fevers, chills, night sweats, headaches, changes in vision, neck pain/stiffness, nausea, diarrhea, vomiting, lesions or rashes.  [name] [name] has no problems obtaining her medication from the pharmacy and remains covered through Medicare.  Denies feelings of being down, depressed, or hopeless recently.  Smokes marijuana on a daily basis and tobacco some days.  Alcohol is also on occasion.  Declines Covid vaccine today.  Accepts condoms.   No Known Allergies    Outpatient Medications Prior to Visit  Medication Sig Dispense Refill  . bictegravir-emtricitabine-tenofovir AF (BIKTARVY) 50-200-25 MG TABS tablet Take 1 tablet by mouth daily. 30 tablet 3  . sulfamethoxazole-trimethoprim (BACTRIM DS) 800-160 MG tablet Take 1 tablet by mouth daily. 30 tablet 3   No facility-administered medications prior to visit.     Past Medical History:  Diagnosis Date  . Acute kidney failure (HCC) 04/2017  . Anal fissure 2017  . C. difficile colitis 10/2016  . Candida  esophagitis (HCC) 2017  . Cellulitis of hand 07/2016  . Chlamydia    multiple episodes  . 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 Genital HSV 2017  . Hepatitis B immune   . HIV disease (HCC)    dx'ed in 2013  . Immune deficiency disorder (HCC)   . 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 Rectal gonorrhea    multiple episodes  . Seizures (HCC) 2017   "probably stress related" (05/24/2017)  . Syphilis   . Syphilis, secondary 11/2011   tx'ed with IM PCN x 3     Past Surgical History:  Procedure Laterality Date  . ANAL EXAMINATION UNDER ANESTHESIA    . INCISION AND DRAINAGE ABSCESS Left 02/16/2019   Procedure: INCISION AND DRAINAGE ABSCESS;  Surgeon: Christia Reading, MD;  Location: [facility] OR;  Service: ENT;  Laterality: Left;  . WISDOM TOOTH EXTRACTION         Review of Systems  Constitutional: Negative for appetite change, chills, diaphoresis, fatigue, fever and unexpected weight change.  Eyes:       Negative for acute change in vision  Respiratory: Negative for chest tightness, shortness of breath and wheezing.   Cardiovascular: Negative for chest pain.  Gastrointestinal: Negative for diarrhea, nausea and vomiting.  Genitourinary: Negative for dysuria.  Musculoskeletal: Negative for neck pain and neck stiffness.  Skin: Negative for rash.  Neurological: Negative for seizures, syncope, weakness and headaches.  Hematological: Negative for adenopathy. Does not bruise/bleed easily.  Psychiatric/Behavioral:  Negative for hallucinations.      Objective:    BP 137/80   Pulse 67   Wt 178 lb (80.7 kg)   SpO2 99%   BMI 20.05 kg/m  Nursing note and vital signs reviewed.  Physical Exam Constitutional:      General: She is not in acute distress.    Appearance: She is well-developed.  Eyes:     Conjunctiva/sclera: Conjunctivae normal.  Cardiovascular:     Rate and Rhythm: Normal rate and regular rhythm.     Heart sounds: Normal heart  sounds. No murmur heard.  No friction rub. No gallop.   Pulmonary:     Effort: Pulmonary effort is normal. No respiratory distress.     Breath sounds: Normal breath sounds. No wheezing or rales.  Chest:     Chest wall: No tenderness.  Abdominal:     General: Bowel sounds are normal.     Palpations: Abdomen is soft.     Tenderness: There is no abdominal tenderness.  Musculoskeletal:     Cervical back: Neck supple.  Lymphadenopathy:     Cervical: No cervical adenopathy.  Skin:    General: Skin is warm and dry.     Findings: No rash.  Neurological:     Mental Status: She is alert and oriented to person, place, and time.  Psychiatric:        Behavior: Behavior normal.        Thought Content: Thought content normal.        Judgment: Judgment normal.      Depression screen [facility] 2/9 04/08/2019 02/05/2019 11/15/2016 09/07/2016 09/07/2016  Decreased Interest 0 0 0 0 0  Down, Depressed, Hopeless 0 0 0 2 1  PHQ - 2 Score 0 0 0 2 1  Altered sleeping - - - 1 -  Tired, decreased energy - - - 1 -  Change in appetite - - - 0 -  Feeling bad or failure about yourself  - - - 1 -  Trouble concentrating - - - 1 -  Moving slowly or fidgety/restless - - - 3 -  Suicidal thoughts - - - 1 -  PHQ-9 Score - - - 10 -  Difficult doing work/chores - - - Somewhat difficult -       Assessment & Plan:    Patient Active Problem List   Diagnosis Date Noted  . 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 Healthcare maintenance 06/02/2020  . Axillary abscess 02/13/2020  . 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 Enteropathogenic Escherichia coli infection 05/26/2017  . Acute renal failure (ARF) (HCC) 05/24/2017  . Abnormal brain MRI 02/07/2017  . Rash 11/15/2016  . Hypotension 11/05/2016  . Shingles 09/14/2016  . 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 Depression 09/08/2016  . AIDS (acquired immune deficiency syndrome) (HCC)   . Seizure (HCC) 08/29/2016     Problem List  Items Addressed This Visit      Other   AIDS (acquired immune deficiency syndrome) (HCC) - Primary    [name] has improved adherence and tolerance to her ART regimen of Biktarvy supplemented with Bactrim for OI prophylaxis reflecting in her improvement in viral load now down to 120 and CD4 count up to 213.  Discussed importance of continue to take medications daily as prescribed.  Continue current dose of Biktarvy supplemented with Bactrim for at least the next 2 months.  Plan for follow-up in 2 months or sooner if needed with lab work 1  to 2 weeks prior to appointment.      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  Will need to be scheduled for third dose next week.  No current symptoms.  Check RPR 3 months following third dose.      Relevant Medications   bictegravir-emtricitabine-tenofovir AF (BIKTARVY) 50-200-25 MG TABS tablet   sulfamethoxazole-trimethoprim (BACTRIM DS) 800-160 MG tablet   Healthcare maintenance     Discussed importance of safe sexual practice to reduce risk of STI.  Condoms provided.  Declines Covid vaccine.  Discussed/encouraged Covid vaccine.  Encouraged routine dental care.          I am having Clifton Custard L. Ladona Ridgel "LaShea" maintain her Biktarvy and sulfamethoxazole-trimethoprim. We administered penicillin g benzathine and penicillin g benzathine.   Meds ordered this encounter  Medications  . bictegravir-emtricitabine-tenofovir AF (BIKTARVY) 50-200-25 MG TABS tablet    Sig: Take 1 tablet by mouth daily.    Dispense:  30 tablet    Refill:  3    Order Specific Question:   Supervising Provider    Answer:   Judyann Munson [[id number]]  . sulfamethoxazole-trimethoprim (BACTRIM DS) 800-160  MG tablet    Sig: Take 1 tablet by mouth daily.    Dispense:  30 tablet    Refill:  3    Order Specific Question:   Supervising Provider    Answer:   Judyann Munson [[id number]]  . penicillin g benzathine (BICILLIN LA) 1200000 UNIT/2ML injection 1.2 Million Units  . penicillin g benzathine (BICILLIN LA) 1200000 UNIT/2ML injection 1.2 Million Units     Follow-up: Return in about 2 months (around 08/02/2020), or if symptoms worsen or fail to improve.   Marcos Eke, MSN, FNP-C Nurse Practitioner [facility] Main number: [phone]

## 2020-06-02 NOTE — Assessment & Plan Note (Signed)
Ms. Rama has improved adherence and tolerance to her ART regimen of Biktarvy supplemented with Bactrim for OI prophylaxis reflecting in her improvement in viral load now down to 120 and CD4 count up to 213.  Discussed importance of continue to take medications daily as prescribed.  Continue current dose of Biktarvy supplemented with Bactrim for at least the next 2 months.  Plan for follow-up in 2 months or sooner if needed with lab work 1 to 2 weeks prior to appointment.

## 2020-06-02 NOTE — Patient Instructions (Signed)
Nice to see you.  Continue to take your Biktarvy and Bactrim daily as prescribed.  Refills will be sent to the pharmacy.  Schedule an appointment for your 3rd dose of Bicillin in 1 week.  Plan for follow up in 2 months or sooner if needed with lab work 1-2 weeks prior to appointment.  Have a great day and stay safe!

## 2020-06-02 NOTE — Assessment & Plan Note (Signed)
   Discussed importance of safe sexual practice to reduce risk of STI.  Condoms provided.  Declines Covid vaccine.  Discussed/encouraged Covid vaccine.  Encouraged routine dental care.

## 2020-06-02 NOTE — Assessment & Plan Note (Signed)
[name] [name] has late latent syphilis and received her first dose of 2,400,000 units of Bicillin last week and her second dose today.  Will need to be scheduled for third dose next week.  No current symptoms.  Check RPR 3 months following third dose.

## 2020-06-09 ENCOUNTER — Ambulatory Visit: Payer: Medicare Other

## 2020-12-22 ENCOUNTER — Other Ambulatory Visit: Payer: Self-pay

## 2020-12-22 ENCOUNTER — Ambulatory Visit: Payer: Medicare Other

## 2020-12-23 ENCOUNTER — Encounter: Payer: Self-pay | Admitting: Family

## 2021-01-17 ENCOUNTER — Other Ambulatory Visit: Payer: Self-pay | Admitting: Family

## 2021-01-18 NOTE — Telephone Encounter (Signed)
Ok for 30 days - needs follow up.

## 2021-01-18 NOTE — Telephone Encounter (Signed)
Please advise on refills Refill history; 06/25/20,08/13/20,09/09/20,12/23/20 Front desk will help schedule patient for labs and office visit

## 2021-03-07 ENCOUNTER — Other Ambulatory Visit: Payer: Self-pay | Admitting: Family

## 2021-03-09 ENCOUNTER — Other Ambulatory Visit: Payer: Self-pay

## 2021-05-24 ENCOUNTER — Other Ambulatory Visit: Payer: Self-pay | Admitting: Family

## 2021-05-25 ENCOUNTER — Telehealth: Payer: Self-pay

## 2021-05-25 NOTE — Telephone Encounter (Signed)
Attempted to call patient after receiving refill request for [facility]. Patient has not been seen since 06/02/20. Is overdue for a follow up visit and labs. Spoke with pharmacist at [facility] who states last time medication was filled was in May. Refills denied at this time.  Pharmacy will let patient know she needs to schedule appointment. Not able to reach her with number listed.  Juanita Laster, RMA

## 2022-01-10 ENCOUNTER — Telehealth: Payer: Self-pay

## 2022-01-10 NOTE — Telephone Encounter (Signed)
Patient last seen 05/2020. Called to offer appointment, call could not be completed.  ? ?Sandie Ano, RN ? ?

## 2022-11-30 DIAGNOSIS — L0231 Cutaneous abscess of buttock: Secondary | ICD-10-CM | POA: Diagnosis not present

## 2022-11-30 DIAGNOSIS — F17210 Nicotine dependence, cigarettes, uncomplicated: Secondary | ICD-10-CM | POA: Diagnosis not present

## 2022-11-30 DIAGNOSIS — Z79899 Other long term (current) drug therapy: Secondary | ICD-10-CM | POA: Diagnosis not present

## 2023-02-06 ENCOUNTER — Other Ambulatory Visit (HOSPITAL_COMMUNITY): Payer: Self-pay

## 2023-02-06 ENCOUNTER — Telehealth: Payer: Self-pay

## 2023-02-06 NOTE — Telephone Encounter (Signed)
[facility] Patient Product/process development scientist completed.    The patient is insured through [employer] and has a $0.00 copay.  We will continue to follow to see if copay assistance is needed.  Clearance Coots, CPhT Specialty Pharmacy Patient [facility] Phone: [phone] Fax:  [phone]

## 2023-02-07 ENCOUNTER — Other Ambulatory Visit: Payer: Self-pay

## 2023-02-07 ENCOUNTER — Ambulatory Visit: Payer: Medicare HMO

## 2023-02-07 ENCOUNTER — Encounter: Payer: Self-pay | Admitting: Family

## 2023-02-07 ENCOUNTER — Ambulatory Visit (INDEPENDENT_AMBULATORY_CARE_PROVIDER_SITE_OTHER): Payer: Medicare HMO | Admitting: Family

## 2023-02-07 ENCOUNTER — Other Ambulatory Visit (HOSPITAL_COMMUNITY)
Admission: RE | Admit: 2023-02-07 | Discharge: 2023-02-07 | Disposition: A | Discharge status: Home / Self Care | Payer: Medicare HMO | Source: Ambulatory Visit | Attending: Family | Admitting: Family

## 2023-02-07 VITALS — BP 102/62 | HR 59 | Temp 97.5°F | Resp 16 | Wt 157.0 lb

## 2023-02-07 DIAGNOSIS — K603 Anal fistula: Secondary | ICD-10-CM

## 2023-02-07 DIAGNOSIS — Z609 Problem related to social environment, unspecified: Secondary | ICD-10-CM

## 2023-02-07 DIAGNOSIS — Z113 Encounter for screening for infections with a predominantly sexual mode of transmission: Secondary | ICD-10-CM | POA: Insufficient documentation

## 2023-02-07 DIAGNOSIS — B20 Human immunodeficiency virus [HIV] disease: Secondary | ICD-10-CM | POA: Diagnosis not present

## 2023-02-07 DIAGNOSIS — Z789 Other specified health status: Secondary | ICD-10-CM

## 2023-02-07 MED ORDER — SULFAMETHOXAZOLE-TRIMETHOPRIM 800-160 MG PO TABS
1.0000 | ORAL_TABLET | Freq: Every day | ORAL | 1 refills | Status: DC
  Filled 2023-02-07: qty 30, 30d supply, fill #0

## 2023-02-07 NOTE — Assessment & Plan Note (Signed)
[name] [name] is currently staying in a hotel with a friend but unsure for how long and likely to experience homelessness. Working at [employer]. Has re-established with [facility] Counselor to help get reconnected to care.

## 2023-02-07 NOTE — Patient Instructions (Signed)
Nice to see you.  We will check your lab work today.  Start taking your medication daily as prescribed.  Plan for follow up in 1 months or sooner if needed with lab work on the same day.  Have a great day and stay safe!  

## 2023-02-07 NOTE — Progress Notes (Signed)
Brief Narrative   Patient ID: [name] [name], adult    DOB: [birth year], 27 y.o.   MRN: [id number]  [name] [name] is a 27 y/o transgender male diagnosed with HIV disease in March 2013 with risk factor of MSM. Initial viral load was 89,700 with CD4 count of 460. No history of opportunistic infection. Genosure with M184V (emtricitabine, lamivudine) and R263K. [name] [name] negative. ART experienced with Complera, Prezcobix, Descovy, Biktarvy and now Symtuza.   Subjective:    Chief Complaint  Patient presents with   transfer b20    Patient reports lump on rectum x 1 year. Denies pain.     HPI:  [name] [name] is a 27 y.o. adult with AIDS last seen on 06/02/20 with adequately controlled virus and improved adherence and good tolerance to Biktarvy. Viral load was 122 with CD4 count 213. RPR was 1:64 and was treated with Bicillin. Was seen at [facility] on 07/01/22 to establish care following hospitalization at [facility] and was having diarrhea at that time with improvement with loperamide. During hospitalization in December for bacteremia was found to have R263K resistance to integrase inhibitors and M184V emtricitibine and lamivudine. Changed to Symtuza. Here today to transfer care.   [name] [name] has been doing okay since she left the hospital in December and has been having difficulties getting established in a place. Off medication for at least the last 5 months because of uncertain social situation. Currently working at [employer] and is temporarily staying with a friend on a couch in a hotel but not likely to last for long. Is re-establishing assistance with [facility] to aid in connecting to care. Believes she has financial coverage through [place]. Continues to have a rectal fissure and feels like a lesion that is painful at times. Has diarrhea which is primarily loose and liquid consistency multiple times per day. Has had diarrhea since initial HIV diagnosis with some improvement with  loperamide. Condoms and STD testing offered.  Denies fevers, chills, night sweats, headaches, changes in vision, neck pain/stiffness, nausea, , vomiting, or rashes.  No Known Allergies    Outpatient Medications Prior to Visit  Medication Sig Dispense Refill   SYMTUZA 800-150-200-10 MG TABS Take 1 tablet by mouth daily.     BIKTARVY 50-200-25 MG TABS tablet TAKE 1 TABLET BY MOUTH DAILY (Patient not taking: Reported on 02/07/2023) 30 tablet 0   sulfamethoxazole-trimethoprim (BACTRIM DS) 800-160 MG tablet Take 1 tablet by mouth daily. (Patient not taking: Reported on 02/07/2023) 30 tablet 3   No facility-administered medications prior to visit.     Past Medical History:  Diagnosis Date   Acute kidney failure (HCC) 04/2017   Anal fissure 2017   C. 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 anus (lgsil) 2013   Rectal gonorrhea    multiple episodes   Seizures (HCC) 2017   "probably stress related" (05/24/2017)   Syphilis    Syphilis, secondary 11/2011   tx'ed with IM PCN x 3     Past Surgical History:  Procedure Laterality Date   ANAL EXAMINATION UNDER ANESTHESIA     INCISION AND DRAINAGE ABSCESS Left 02/16/2019  Procedure: INCISION AND DRAINAGE ABSCESS;  Surgeon: Christia Reading, MD;  Location: [facility] OR;  Service: ENT;  Laterality: Left;   WISDOM TOOTH EXTRACTION        Review of Systems  Constitutional:  Negative for appetite change, chills, diaphoresis, fatigue, fever and unexpected weight change.  Eyes:        Negative for acute change in vision  Respiratory:  Negative for chest tightness, shortness of breath and wheezing.   Cardiovascular:  Negative for chest pain.   Gastrointestinal:  Positive for diarrhea. Negative for nausea and vomiting.  Genitourinary:  Negative for dysuria, pelvic pain and vaginal discharge.  Musculoskeletal:  Negative for neck pain and neck stiffness.  Skin:  Negative for rash.  Neurological:  Negative for seizures, syncope, weakness and headaches.  Hematological:  Negative for adenopathy. Does not bruise/bleed easily.  Psychiatric/Behavioral:  Negative for hallucinations.       Objective:    BP 102/62   Pulse (!) 59   Temp (!) 97.5 F (36.4 C) (Oral)   Resp 16   Wt 157 lb (71.2 kg)   SpO2 100%   BMI 17.69 kg/m  Nursing note and vital signs reviewed.  Physical Exam Constitutional:      General: She is not in acute distress.    Appearance: She is well-developed.  Cardiovascular:     Rate and Rhythm: Normal rate and regular rhythm.     Heart sounds: Normal heart sounds.  Pulmonary:     Effort: Pulmonary effort is normal.     Breath sounds: Normal breath sounds.  Genitourinary:    Comments: Rectal lesion around 11 o'clock position of anus that appears consistent with skin tag or cannot rule out anal condyloma .  Skin:    General: Skin is warm and dry.  Neurological:     Mental Status: She is alert and oriented to person, place, and time.  Psychiatric:        Behavior: Behavior normal.        Thought Content: Thought content normal.        Judgment: Judgment normal.         02/07/2023    9:01 AM 04/08/2019    9:29 AM 02/05/2019    3:34 PM 11/15/2016    4:24 PM 09/07/2016    3:11 PM  Depression screen PHQ 2/9  Decreased Interest 0 0 0 0 0  Down, Depressed, Hopeless 1 0 0 0 2  PHQ - 2 Score 1 0 0 0 2  Altered sleeping     1  Tired, decreased energy     1  Change in appetite     0  Feeling bad or failure about yourself      1  Trouble concentrating     1  Moving slowly or fidgety/restless     3  Suicidal thoughts     1  PHQ-9 Score     10  Difficult doing work/chores     Somewhat difficult        Assessment & Plan:    Patient Active Problem List   Diagnosis Date Noted   Male-to-male transgender person 02/07/2023   Poor social situation 02/07/2023   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   Healthcare maintenance 06/02/2020   Axillary abscess 02/13/2020   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, 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   Enteropathogenic Escherichia coli infection 05/26/2017   Acute renal failure (ARF) (HCC) 05/24/2017   Abnormal brain MRI 02/07/2017   Rash 11/15/2016  Hypotension 11/05/2016   Shingles 09/14/2016   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 Reviewed previous lab work and [employer] and discussed importance of taking medication regularly to avoid development of resistance as evidenced with new mutations. Check lab work today. Will restart Symtuza with sample provided with Bactrim given continued low CD4 count and increased risk of opportunistic infection. Paramedic working on Industrial/product designer and potential for housing assistance. Plan for follow up in 1 month or sooner if needed with lab work on the same day.       Relevant Medications   SYMTUZA 800-150-200-10 MG TABS   sulfamethoxazole-trimethoprim (BACTRIM DS) 800-160 MG tablet   Other Relevant Orders   HIV RNA, RTPCR W/R GT (RTI, PI,INT)   COMPLETE METABOLIC PANEL WITH GFR   T-helper cell (CD4)- ([facility] clinic only)   Perianal fistula    Previous history of perianal fistula likely contributing to either skin tag or condyloma. There is no evidence of infection. Discussed anal hygiene. Suspect she will need surgical intervention to resolve this issue.       Male-to-male transgender person     Previously on feminizing hormones and not currently taking. She is in agreement to hold off on feminizing hormones at this time until we get her more established.       Poor social situation    [name] [name] is currently staying in a hotel with a friend but unsure for how long and likely to experience homelessness. Working at [employer]. Has re-established with [facility] Counselor to help get reconnected to care.       Other Visit Diagnoses     Screening for STDs (sexually transmitted diseases)       Relevant Orders   Cytology (oral, anal, urethral) ancillary only   Cytology (oral, anal, urethral) ancillary only   Urine cytology ancillary only   RPR        I have discontinued [name] Custard L. Ladona Ridgel "[name] [name]"'s sulfamethoxazole-trimethoprim and Biktarvy. I am also having her start on sulfamethoxazole-trimethoprim. Additionally, I am having her maintain her Symtuza.   Meds ordered this encounter  Medications   sulfamethoxazole-trimethoprim (BACTRIM DS) 800-160 MG tablet    Sig: Take 1 tablet by mouth daily.    Dispense:  30 tablet    Refill:  1    Order Specific Question:   Supervising Provider    Answer:   Judyann Munson [[id number]]     Follow-up: Return in about 1 month (around 03/10/2023), or if symptoms worsen or fail to improve.   Marcos Eke, MSN, FNP-C Nurse Practitioner [facility] for Infectious Disease [facility] Main number: [phone]

## 2023-02-07 NOTE — Assessment & Plan Note (Addendum)
Previous history of perianal fistula likely contributing to either skin tag or condyloma. There is no evidence of infection. Discussed anal hygiene. Suspect she will need surgical intervention to resolve this issue.

## 2023-02-07 NOTE — Assessment & Plan Note (Signed)
Narda Rutherford returns to care today with poorly controlled multidrug resistant virus. Reviewed previous lab work and [employer] and discussed importance of taking medication regularly to avoid development of resistance as evidenced with new mutations. Check lab work today. Will restart Symtuza with sample provided with Bactrim given continued low CD4 count and increased risk of opportunistic infection. Paramedic working on Industrial/product designer and potential for housing assistance. Plan for follow up in 1 month or sooner if needed with lab work on the same day.

## 2023-02-07 NOTE — Assessment & Plan Note (Signed)
Previously on feminizing hormones and not currently taking. She is in agreement to hold off on feminizing hormones at this time until we get her more established.

## 2023-02-08 LAB — CYTOLOGY, (ORAL, ANAL, URETHRAL) ANCILLARY ONLY
Chlamydia: NEGATIVE
Chlamydia: NEGATIVE
Comment: NEGATIVE
Comment: NEGATIVE
Comment: NORMAL
Comment: NORMAL
Neisseria Gonorrhea: NEGATIVE
Neisseria Gonorrhea: NEGATIVE

## 2023-02-08 LAB — URINE CYTOLOGY ANCILLARY ONLY
Chlamydia: NEGATIVE
Comment: NEGATIVE
Comment: NORMAL
Neisseria Gonorrhea: NEGATIVE

## 2023-02-08 LAB — COMPLETE METABOLIC PANEL WITH GFR
Albumin: 4.5 g/dL (ref 3.6–5.1)
Alkaline phosphatase (APISO): 108 U/L (ref 36–130)
BUN/Creatinine Ratio: 13 (calc) (ref 6–22)
Calcium: 9.2 mg/dL (ref 8.6–10.3)
Potassium: 3.6 mmol/L (ref 3.5–5.3)
Sodium: 140 mmol/L (ref 135–146)
Total Protein: 9.2 g/dL — ABNORMAL HIGH (ref 6.1–8.1)
eGFR: 66 mL/min/{1.73_m2} (ref >=60)

## 2023-02-08 LAB — T-HELPER CELL (CD4) - (RCID CLINIC ONLY)
CD4 % Helper T Cell: 1 % — ABNORMAL LOW (ref 33–65)
CD4 T Cell Abs: <35 /uL — ABNORMAL LOW (ref 400–1790)

## 2023-02-09 ENCOUNTER — Other Ambulatory Visit: Payer: Self-pay | Admitting: Pharmacist

## 2023-02-09 DIAGNOSIS — B20 Human immunodeficiency virus [HIV] disease: Secondary | ICD-10-CM

## 2023-02-09 MED ORDER — SYMTUZA 800-150-200-10 MG PO TABS
1.0000 | ORAL_TABLET | Freq: Every day | ORAL | 0 refills | Status: AC
Start: 2023-02-07 — End: 2023-03-09

## 2023-02-09 NOTE — Progress Notes (Signed)
Medication Samples have been provided to the patient.  Drug name: Symtuza        Strength: 800/150/200/10 mg Qty: 30 Tablets (1 bottles) LOT: [id number]   Exp.Date: 4/25  Dosing instructions: Take one tablet by mouth once daily with food  The patient has been instructed regarding the correct time, dose, and frequency of taking this medication, including desired effects and most common side effects.   Doris Gruhn, PharmD, CPP, BCIDP, AAHIVP Clinical Pharmacist Practitioner Infectious Diseases Clinical Pharmacist [facility]  

## 2023-02-10 ENCOUNTER — Ambulatory Visit: Payer: Medicare Other | Admitting: Family

## 2023-02-10 LAB — HIV RNA, RTPCR W/R GT (RTI, PI,INT): HIV 1 RNA Quant: 318000 copies/mL — ABNORMAL HIGH

## 2023-02-18 LAB — HIV-1 INTEGRASE GENOTYPE

## 2023-02-18 LAB — RPR TITER: RPR Titer: 1:1 {titer} — ABNORMAL HIGH

## 2023-02-18 LAB — COMPLETE METABOLIC PANEL WITH GFR
AG Ratio: 1 (calc) (ref 1.0–2.5)
ALT: 49 U/L — ABNORMAL HIGH (ref 9–46)
AST: 37 U/L (ref 10–40)
BUN: 19 mg/dL (ref 7–25)
CO2: 20 mmol/L (ref 20–32)
Chloride: 111 mmol/L — ABNORMAL HIGH (ref 98–110)
Creat: 1.48 mg/dL — ABNORMAL HIGH (ref 0.60–1.24)
Globulin: 4.7 g/dL (calc) — ABNORMAL HIGH (ref 1.9–3.7)
Glucose, Bld: 63 mg/dL — ABNORMAL LOW (ref 65–99)
Total Bilirubin: 0.3 mg/dL (ref 0.2–1.2)

## 2023-02-18 LAB — HIV RNA, RTPCR W/R GT (RTI, PI,INT): HIV-1 RNA Quant, Log: 5.5 Log copies/mL — ABNORMAL HIGH

## 2023-02-18 LAB — T PALLIDUM AB: T Pallidum Abs: POSITIVE — AB

## 2023-02-18 LAB — RPR: RPR Ser Ql: REACTIVE — AB

## 2023-02-18 LAB — HIV-1 GENOTYPE: HIV-1 Genotype: DETECTED — AB

## 2023-03-10 ENCOUNTER — Ambulatory Visit (INDEPENDENT_AMBULATORY_CARE_PROVIDER_SITE_OTHER): Payer: Medicare HMO | Admitting: Family

## 2023-03-10 ENCOUNTER — Encounter: Payer: Self-pay | Admitting: Family

## 2023-03-10 ENCOUNTER — Other Ambulatory Visit: Payer: Self-pay

## 2023-03-10 VITALS — BP 99/69 | HR 87 | Temp 97.3°F | Ht >= 80 in | Wt 153.0 lb

## 2023-03-10 DIAGNOSIS — B20 Human immunodeficiency virus [HIV] disease: Secondary | ICD-10-CM | POA: Diagnosis not present

## 2023-03-10 DIAGNOSIS — Z789 Other specified health status: Secondary | ICD-10-CM | POA: Diagnosis not present

## 2023-03-10 DIAGNOSIS — Z0000 Encounter for general adult medical examination without abnormal findings: Secondary | ICD-10-CM

## 2023-03-10 DIAGNOSIS — Z609 Problem related to social environment, unspecified: Secondary | ICD-10-CM

## 2023-03-10 DIAGNOSIS — F1990 Other psychoactive substance use, unspecified, uncomplicated: Secondary | ICD-10-CM

## 2023-03-10 MED ORDER — SYMTUZA 800-150-200-10 MG PO TABS
1.0000 | ORAL_TABLET | Freq: Every day | ORAL | 4 refills | Status: DC
Start: 2023-03-10 — End: 2023-05-11

## 2023-03-10 MED ORDER — SULFAMETHOXAZOLE-TRIMETHOPRIM 800-160 MG PO TABS
1.0000 | ORAL_TABLET | Freq: Every day | ORAL | 4 refills | Status: DC
Start: 2023-03-10 — End: 2023-05-08

## 2023-03-10 NOTE — Assessment & Plan Note (Addendum)
Discussed importance of safe sexual practice and condom use. Condoms and STD testing offered.  Declines vaccines Has dental appointment next week.

## 2023-03-10 NOTE — Assessment & Plan Note (Signed)
[name] [name] continues to work with bridge counseling and is working on Journalist, newspaper access as well as possible employment.  Challenges remain with continued drug use.  Continue case management

## 2023-03-10 NOTE — Assessment & Plan Note (Addendum)
[name] [name] has reported improved adherence and good tolerance to Symtuza.  Reviewed lab work and discussed plan of care with goals of obtaining undetectable status.  Check lab work.  Remains at risk for opportunistic infection and will need to continue Bactrim along with current dose of Symtuza.  Plan for follow-up in 2 months or sooner if needed with lab work on the same day.

## 2023-03-10 NOTE — Progress Notes (Addendum)
Brief Narrative   Patient ID: [name] [name], adult    DOB: 1995-11-30, 27 y.o.   MRN: [id number]  [name] [name] is a 27 y/o transgender male diagnosed with HIV disease in March 2013 with risk factor of MSM. Initial viral load was 89,700 with CD4 count of 460. No history of opportunistic infection. Genosure with M184V (emtricitabine, lamivudine) and R263K. [id number] negative. ART experienced with Complera, Prezcobix, Descovy, Biktarvy and now Symtuza.   Subjective:    Chief Complaint  Patient presents with   HIV Positive/AIDS    HPI:  [name] [name] is a 27 y.o. adult with AIDS/HIV last seen on 02/07/2023 with poorly controlled virus secondary to less than optimal adherence to her ART regimen of Symtuza.  Viral load was 318,000 with CD4 count less than 35.  Genotype with no new medication resistance mutations.  STD testing was negative.  Renal function, hepatic function, and electrolytes within normal ranges.  Here today for follow-up.  [name] [name] returns for follow-up with the assistance of bridge counseling.  Has been taking her medications as prescribed with no adverse side effects or problems obtaining medication.  May have missed 1-2 doses since her last appointment.  Feeling better since starting medication.  Recently started a 1 day job and is currently seeking employment.  She continues to use cocaine, ecstasy, marijuana, and alcohol.  Housing remains an issue and is working on getting into a shelter. Condoms and STD testing offered.  Declines vaccinations.  Denies fevers, chills, night sweats, headaches, changes in vision, neck pain/stiffness, nausea, diarrhea, vomiting, lesions or rashes.  No Known Allergies    Outpatient Medications Prior to Visit  Medication Sig Dispense Refill   sulfamethoxazole-trimethoprim (BACTRIM DS) 800-160 MG tablet Take 1 tablet by mouth daily. 30 tablet 1   SYMTUZA 800-150-200-10 MG TABS Take 1 tablet by mouth daily.     No facility-administered  medications prior to visit.     Past Medical History:  Diagnosis Date   Acute kidney failure (HCC) 04/2017   Anal fissure 2017   C. 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 anus (lgsil) 2013   Rectal gonorrhea    multiple episodes   Seizures (HCC) 2017   "probably stress related" (05/24/2017)   Syphilis    Syphilis, secondary 11/2011   tx'ed with IM PCN x 3     Past Surgical History:  Procedure Laterality Date   ANAL EXAMINATION UNDER ANESTHESIA     INCISION AND DRAINAGE ABSCESS Left 02/16/2019   Procedure: INCISION AND DRAINAGE ABSCESS;  Surgeon: Christia Reading, MD;  Location: [facility] OR;  Service: ENT;  Laterality: Left;   WISDOM TOOTH EXTRACTION        Review of Systems  Constitutional:  Negative for appetite change, chills, diaphoresis, fatigue, fever and unexpected weight change.  Eyes:        Negative for acute change in vision  Respiratory:  Negative for chest tightness, shortness of breath and wheezing.   Cardiovascular:  Negative for chest pain.  Gastrointestinal:  Negative for diarrhea, nausea and vomiting.  Genitourinary:  Negative for dysuria, pelvic pain and vaginal discharge.  Musculoskeletal:  Negative for neck pain and neck stiffness.  Skin:  Negative for rash.  Neurological:  Negative for seizures, syncope, weakness and headaches.  Hematological:  Negative for adenopathy. Does not bruise/bleed easily.  Psychiatric/Behavioral:  Negative for hallucinations.       Objective:    BP 99/69   Pulse 87   Temp (!) 97.3 F (36.3 C) (Oral)   Ht 6\' 8"  (2.032 m)   Wt 153 lb (69.4 kg)   SpO2 100%   BMI 16.81 kg/m  Nursing note and vital signs  reviewed.  Physical Exam Constitutional:      General: She is not in acute distress.    Appearance: She is well-developed.  Eyes:     Conjunctiva/sclera: Conjunctivae normal.  Cardiovascular:     Rate and Rhythm: Normal rate and regular rhythm.     Heart sounds: Normal heart sounds. No murmur heard.    No friction rub. No gallop.  Pulmonary:     Effort: Pulmonary effort is normal. No respiratory distress.     Breath sounds: Normal breath sounds. No wheezing or rales.  Chest:     Chest wall: No tenderness.  Abdominal:     General: Bowel sounds are normal.     Palpations: Abdomen is soft.     Tenderness: There is no abdominal tenderness.  Musculoskeletal:     Cervical back: Neck supple.  Lymphadenopathy:     Cervical: No cervical adenopathy.  Skin:    General: Skin is warm and dry.     Findings: No rash.  Neurological:     Mental Status: She is alert and oriented to person, place, and time.  Psychiatric:        Behavior: Behavior normal.        Thought Content: Thought content normal.        Judgment: Judgment normal.         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   Healthcare maintenance 06/02/2020   Axillary abscess 02/13/2020   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, 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   Enteropathogenic Escherichia coli infection 05/26/2017   Acute renal failure (ARF) (HCC) 05/24/2017   Abnormal brain MRI 02/07/2017   Rash 11/15/2016   Hypotension 11/05/2016   Shingles 09/14/2016   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  Reviewed lab work and discussed plan of care with goals of obtaining undetectable status.  Check lab work.  Remains at risk for opportunistic infection and will need to continue Bactrim along with current dose of Symtuza.  Plan for follow-up in 2 months or sooner if needed  with lab work on the same day.      Relevant Medications   sulfamethoxazole-trimethoprim (BACTRIM DS) 800-160 MG tablet   SYMTUZA 800-150-200-10 MG TABS   Other Relevant Orders   COMPLETE METABOLIC PANEL WITH GFR   HIV-1 RNA quant-no reflex-bld   T-helper cells (CD4) count (not at [facility])   Healthcare maintenance    Discussed importance of safe sexual practice and condom use. Condoms and STD testing offered.  Declines vaccines Has dental appointment next week.       Male-to-male transgender person    Not currently on medication/hormones.  Once HIV is well-controlled we will consider additional hormones if interested.      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  Challenges remain with continued drug use.  Continue case management      Polysubstance use disorder    [name] [name] continues to use cocaine, ecstasy, alcohol, and marijuana.  Working on cutting back as she is attempting to get into a shelter as well as seek employment.  Remains at high risk for lapses in medication with continued use.  Encouraged cessation. Discussed with case Production designer, theatre/television/film.         I am having Clifton Custard L. Ladona Ridgel "[place]She[place]" maintain her sulfamethoxazole-trimethoprim and Symtuza.   Meds ordered this encounter  Medications    sulfamethoxazole-trimethoprim (BACTRIM DS) 800-160 MG tablet    Sig: Take 1 tablet by mouth daily.    Dispense:  30 tablet    Refill:  4    Order Specific Question:   Supervising Provider    Answer:   SNIDER, CYNTHIA [[id number]]   SYMTUZA 800-150-200-10 MG TABS    Sig: Take 1 tablet by mouth daily.    Dispense:  30 tablet    Refill:  4    Order Specific Question:   Supervising Provider    Answer:   Judyann Munson [[id number]]     Follow-up: Return in about 2 months (around 05/10/2023), or if symptoms worsen or fail to improve.   Marcos Eke, MSN, FNP-C Nurse Practitioner [facility] Main number: [phone]

## 2023-03-10 NOTE — Patient Instructions (Signed)
Nice to see you.  We will check your lab work today.  Continue to take your medication daily as prescribed.  Refills have been sent to the pharmacy.  Plan for follow up in 2 months or sooner if needed with lab work on the same day.  Have a great day and stay safe!  

## 2023-03-10 NOTE — Assessment & Plan Note (Signed)
Not currently on medication/hormones.  Once HIV is well-controlled we will consider additional hormones if interested.

## 2023-03-10 NOTE — Assessment & Plan Note (Addendum)
Ms. Gabehart continues to use cocaine, ecstasy, alcohol, and marijuana.  Working on cutting back as she is attempting to get into a shelter as well as seek employment.  Remains at high risk for lapses in medication with continued use.  Encouraged cessation. Discussed with case Production designer, theatre/television/film.

## 2023-03-11 LAB — COMPLETE METABOLIC PANEL WITH GFR
AG Ratio: 0.8 (calc) — ABNORMAL LOW (ref 1.0–2.5)
Albumin: 4.2 g/dL (ref 3.6–5.1)
Alkaline phosphatase (APISO): 103 U/L (ref 36–130)
CO2: 22 mmol/L (ref 20–32)
Creat: 1.34 mg/dL — ABNORMAL HIGH (ref 0.60–1.24)
Globulin: 5 g/dL (calc) — ABNORMAL HIGH (ref 1.9–3.7)
Potassium: 3.9 mmol/L (ref 3.5–5.3)
Sodium: 140 mmol/L (ref 135–146)
Total Bilirubin: 0.4 mg/dL (ref 0.2–1.2)

## 2023-03-12 LAB — HIV-1 RNA QUANT-NO REFLEX-BLD: HIV 1 RNA Quant: 172 Copies/mL — ABNORMAL HIGH

## 2023-03-13 LAB — COMPLETE METABOLIC PANEL WITH GFR
ALT: 39 U/L (ref 9–46)
AST: 31 U/L (ref 10–40)
BUN/Creatinine Ratio: 14 (calc) (ref 6–22)
BUN: 19 mg/dL (ref 7–25)
Calcium: 9.5 mg/dL (ref 8.6–10.3)
Chloride: 109 mmol/L (ref 98–110)
Glucose, Bld: 43 mg/dL — ABNORMAL LOW (ref 65–99)
Total Protein: 9.2 g/dL — ABNORMAL HIGH (ref 6.1–8.1)
eGFR: 74 mL/min/{1.73_m2} (ref >=60)

## 2023-03-13 LAB — T-HELPER CELLS (CD4) COUNT (NOT AT ARMC)
Absolute CD4: <20 cells/uL — ABNORMAL LOW (ref 490–1740)
CD4 T Helper %: 2 % — ABNORMAL LOW (ref 30–61)
Total lymphocyte count: 751 cells/uL — ABNORMAL LOW (ref 850–3900)

## 2023-03-13 LAB — HIV-1 RNA QUANT-NO REFLEX-BLD: HIV-1 RNA Quant, Log: 2.24 Log cps/mL — ABNORMAL HIGH

## 2023-05-07 ENCOUNTER — Encounter (HOSPITAL_COMMUNITY): Payer: Self-pay

## 2023-05-07 ENCOUNTER — Other Ambulatory Visit: Payer: Self-pay

## 2023-05-07 ENCOUNTER — Emergency Department (HOSPITAL_COMMUNITY): Payer: Medicare PPO

## 2023-05-07 ENCOUNTER — Emergency Department (HOSPITAL_COMMUNITY)
Admission: EM | Admit: 2023-05-07 | Discharge: 2023-05-08 | Disposition: A | Discharge status: Home / Self Care | Payer: Medicare PPO | Attending: Emergency Medicine | Admitting: Emergency Medicine

## 2023-05-07 DIAGNOSIS — N451 Epididymitis: Secondary | ICD-10-CM | POA: Insufficient documentation

## 2023-05-07 DIAGNOSIS — N50812 Left testicular pain: Secondary | ICD-10-CM | POA: Insufficient documentation

## 2023-05-07 DIAGNOSIS — Z21 Asymptomatic human immunodeficiency virus [HIV] infection status: Secondary | ICD-10-CM | POA: Diagnosis not present

## 2023-05-07 DIAGNOSIS — N5089 Other specified disorders of the male genital organs: Secondary | ICD-10-CM | POA: Diagnosis not present

## 2023-05-07 DIAGNOSIS — R609 Edema, unspecified: Secondary | ICD-10-CM | POA: Diagnosis not present

## 2023-05-07 DIAGNOSIS — N5082 Scrotal pain: Secondary | ICD-10-CM | POA: Diagnosis not present

## 2023-05-07 DIAGNOSIS — N492 Inflammatory disorders of scrotum: Secondary | ICD-10-CM

## 2023-05-07 DIAGNOSIS — R1900 Intra-abdominal and pelvic swelling, mass and lump, unspecified site: Secondary | ICD-10-CM | POA: Insufficient documentation

## 2023-05-07 DIAGNOSIS — N50811 Right testicular pain: Secondary | ICD-10-CM | POA: Insufficient documentation

## 2023-05-07 DIAGNOSIS — N3289 Other specified disorders of bladder: Secondary | ICD-10-CM | POA: Diagnosis not present

## 2023-05-07 LAB — COMPREHENSIVE METABOLIC PANEL
ALT: 15 U/L (ref 0–44)
AST: 17 U/L (ref 15–41)
Albumin: 3.2 g/dL — ABNORMAL LOW (ref 3.5–5.0)
Alkaline Phosphatase: 79 U/L (ref 38–126)
Anion gap: 11 (ref 5–15)
BUN: 16 mg/dL (ref 6–20)
CO2: 21 mmol/L — ABNORMAL LOW (ref 22–32)
Calcium: 8.9 mg/dL (ref 8.9–10.3)
Chloride: 100 mmol/L (ref 98–111)
Creatinine, Ser: 1.25 mg/dL — ABNORMAL HIGH (ref 0.61–1.24)
GFR, Estimated: >60 mL/min (ref >60)
Glucose, Bld: 136 mg/dL — ABNORMAL HIGH (ref 70–99)
Potassium: 4.1 mmol/L (ref 3.5–5.1)
Sodium: 132 mmol/L — ABNORMAL LOW (ref 135–145)
Total Bilirubin: 0.4 mg/dL (ref 0.3–1.2)
Total Protein: 9 g/dL — ABNORMAL HIGH (ref 6.5–8.1)

## 2023-05-07 LAB — CBC WITH DIFFERENTIAL/PLATELET
Abs Immature Granulocytes: 0.02 10*3/uL (ref 0.00–0.07)
Basophils Absolute: 0 10*3/uL (ref 0.0–0.1)
Basophils Relative: 1 %
Eosinophils Absolute: 0.7 10*3/uL — ABNORMAL HIGH (ref 0.0–0.5)
Eosinophils Relative: 8 %
HCT: 37.1 % — ABNORMAL LOW (ref 39.0–52.0)
Hemoglobin: 12 g/dL — ABNORMAL LOW (ref 13.0–17.0)
Immature Granulocytes: 0 %
Lymphocytes Relative: 16 %
Lymphs Abs: 1.3 10*3/uL (ref 0.7–4.0)
MCH: 33.6 pg (ref 26.0–34.0)
MCHC: 32.3 g/dL (ref 30.0–36.0)
MCV: 103.9 fL — ABNORMAL HIGH (ref 80.0–100.0)
Monocytes Absolute: 0.5 10*3/uL (ref 0.1–1.0)
Monocytes Relative: 6 %
Neutro Abs: 5.5 10*3/uL (ref 1.7–7.7)
Neutrophils Relative %: 69 %
Platelets: 362 10*3/uL (ref 150–400)
RBC: 3.57 MIL/uL — ABNORMAL LOW (ref 4.22–5.81)
RDW: 12.1 % (ref 11.5–15.5)
WBC: 8 10*3/uL (ref 4.0–10.5)
nRBC: 0 % (ref 0.0–0.2)

## 2023-05-07 LAB — I-STAT CG4 LACTIC ACID, ED
Lactic Acid, Venous: 1.1 mmol/L (ref 0.5–1.9)
Lactic Acid, Venous: 0.7 mmol/L (ref 0.5–1.9)

## 2023-05-07 MED ORDER — LEVOFLOXACIN IN D5W 500 MG/100ML IV SOLN
500.0000 mg | Freq: Once | INTRAVENOUS | Status: AC
  Administered 2023-05-07: 500 mg via INTRAVENOUS
  Filled 2023-05-07: qty 100

## 2023-05-07 MED ORDER — SODIUM CHLORIDE 0.9 % IV BOLUS
1000.0000 mL | Freq: Once | INTRAVENOUS | Status: AC
  Administered 2023-05-07: 1000 mL via INTRAVENOUS

## 2023-05-07 MED ORDER — SODIUM CHLORIDE 0.9 % IV SOLN
1.0000 g | Freq: Once | INTRAVENOUS | Status: AC
  Administered 2023-05-07: 1 g via INTRAVENOUS
  Filled 2023-05-07: qty 10

## 2023-05-07 MED ORDER — MORPHINE SULFATE (PF) 4 MG/ML IV SOLN
4.0000 mg | Freq: Once | INTRAVENOUS | Status: AC
  Administered 2023-05-07: 4 mg via INTRAVENOUS
  Filled 2023-05-07: qty 1

## 2023-05-07 NOTE — ED Provider Triage Note (Signed)
Emergency Medicine Provider Triage Evaluation Note  KRUZ GARR , a 27 y.o. adult  was evaluated in triage.  Pt complains of scrotal pain. Pain and swelling to scrotum x 2 weeks.  Attempted to squeeze it and think he may have popped an abscess internally. Now having more pain. Denies fever, chills.  HIV not well controlled.  No dysuria or penile discharge  Review of Systems  Positive: As above Negative: As above  Physical Exam  BP 138/78 (BP Location: Right Arm)   Pulse 84   Temp 97.8 F (36.6 C) (Oral)   Resp 19   Ht 6\' 10"  (2.083 m)   Wt 74.8 kg   SpO2 98%   BMI 17.25 kg/m  Gen:   Awake, no distress   Resp:  Normal effort  MSK:   Moves extremities without difficulty  Other:    Medical Decision Making  Medically screening exam initiated at 8:46 PM.  Appropriate orders placed.  [name] was informed that the remainder of the evaluation will be completed by another provider, this initial triage assessment does not replace that evaluation, and the importance of remaining in the ED until their evaluation is complete.     Fayrene Helper, PA-C 05/07/23 2047

## 2023-05-07 NOTE — ED Provider Notes (Signed)
[place] EMERGENCY DEPARTMENT AT [facility] Provider Note   CSN: [id number] Arrival date & time: 05/07/23  2014     History  Chief Complaint  Patient presents with   Groin Swelling    [name] [name] is a 27 y.o. adult.  Patient is a 27 year old male senting for testicular pain and swelling.  Patient states that over the last 2 weeks they have had increased pain and swelling in the testes.  They state that today they had an area that they thought they could pop and drain on the testes but states they could feel the drainage internally without external drainage.  They admit to significant increase in pain and swelling in bilateral testicles over the last 24 hours.  Denies any fevers, chills, nausea, vomiting.  History positive for HIV that is poorly controlled.  The history is provided by the patient. No language interpreter was used.       Home Medications Prior to Admission medications   Medication Sig Start Date End Date Taking? Authorizing Provider  sulfamethoxazole-trimethoprim (BACTRIM DS) 800-160 MG tablet Take 1 tablet by mouth daily. 03/10/23   Veryl Speak, FNP  SYMTUZA 800-150-200-10 MG TABS Take 1 tablet by mouth daily. 03/10/23   Veryl Speak, FNP      Allergies    Patient has no known allergies.    Review of Systems   Review of Systems  Constitutional:  Negative for chills and fever.  HENT:  Negative for ear pain and sore throat.   Eyes:  Negative for pain and visual disturbance.  Respiratory:  Negative for cough and shortness of breath.   Cardiovascular:  Negative for chest pain and palpitations.  Gastrointestinal:  Negative for abdominal pain and vomiting.  Genitourinary:  Positive for scrotal swelling and testicular pain. Negative for dysuria, hematuria, penile pain and penile swelling.  Musculoskeletal:  Negative for arthralgias and back pain.  Skin:  Negative for color change and rash.  Neurological:  Negative for seizures and  syncope.  All other systems reviewed and are negative.   Physical Exam Updated Vital Signs BP 138/78 (BP Location: Right Arm)   Pulse 84   Temp 97.8 F (36.6 C) (Oral)   Resp 19   Ht 6\' 10"  (2.083 m)   Wt 74.8 kg   SpO2 98%   BMI 17.25 kg/m  Physical Exam Vitals and nursing note reviewed. Exam conducted with a chaperone present.  Constitutional:      General: She is not in acute distress.    Appearance: She is well-developed.  HENT:     Head: Normocephalic and atraumatic.  Eyes:     Conjunctiva/sclera: Conjunctivae normal.  Cardiovascular:     Rate and Rhythm: Normal rate and regular rhythm.     Heart sounds: No murmur heard. Pulmonary:     Effort: Pulmonary effort is normal. No respiratory distress.     Breath sounds: Normal breath sounds.  Abdominal:     Palpations: Abdomen is soft.     Tenderness: There is no abdominal tenderness.  Genitourinary:    Penis: Normal.      Testes:        Right: Mass, tenderness and swelling present.        Left: Mass, tenderness and swelling present.  Musculoskeletal:        General: No swelling.     Cervical back: Neck supple.  Skin:    General: Skin is warm and dry.     Capillary Refill: Capillary  refill takes less than 2 seconds.  Neurological:     Mental Status: She is alert.  Psychiatric:        Mood and Affect: Mood normal.     ED Results / Procedures / Treatments   Labs (all labs ordered are listed, but only abnormal results are displayed) Labs Reviewed  COMPREHENSIVE METABOLIC PANEL - Abnormal; Notable for the following components:      Result Value   Sodium 132 (*)    CO2 21 (*)    Glucose, Bld 136 (*)    Creatinine, Ser 1.25 (*)    Total Protein 9.0 (*)    Albumin 3.2 (*)    All other components within normal limits  CBC WITH DIFFERENTIAL/PLATELET - Abnormal; Notable for the following components:   RBC 3.57 (*)    Hemoglobin 12.0 (*)    HCT 37.1 (*)    MCV 103.9 (*)    Eosinophils Absolute 0.7 (*)    All  other components within normal limits  URINALYSIS, ROUTINE W REFLEX MICROSCOPIC  RPR  T-HELPER CELLS (CD4) COUNT (NOT AT [facility])  I-STAT CG4 LACTIC ACID, ED  I-STAT CG4 LACTIC ACID, ED  GC/CHLAMYDIA PROBE AMP ([place]) NOT AT [facility]    EKG None  Radiology US SCROTUM W/DOPPLER  Result Date: 05/07/2023 CLINICAL DATA:  Scrotal pain, right greater than left EXAM: SCROTAL ULTRASOUND DOPPLER ULTRASOUND OF THE TESTICLES TECHNIQUE: Complete ultrasound examination of the testicles, epididymis, and other scrotal structures was performed. Color and spectral Doppler ultrasound were also utilized to evaluate blood flow to the testicles. COMPARISON:  None Available. FINDINGS: Right testicle Measurements: 4.6 x 2.3 x 2.2 cm. No mass or microlithiasis visualized. Left testicle Measurements: 3.8 x 2.1 x 2.6 cm. No mass or microlithiasis visualized. Right epididymis: Heterogeneous, enlarged, and hypervascular. Associated irregular parenchymal fluid collection measuring approximately 4.3 x 2.3 cm, worrisome for abscess. Left epididymis: Heterogeneous, enlarged, and hypervascular. Associated irregular parenchymal fluid collection measuring approximately 2.9 x 2.3 cm, worrisome for abscess Hydrocele:  None visualized. Varicocele:  None visualized. Pulsed Doppler interrogation of both testes demonstrates normal low resistance arterial and venous waveforms bilaterally. Additional comments: Scrotal wall thickening/edema. IMPRESSION: Bilateral epididymitis with possible epididymal abscesses, as above. Associated scrotal wall thickening/edema. No evidence of testicular torsion. Electronically Signed   By: Charline Bills M.D.   On: 05/07/2023 22:16    Procedures Procedures    Medications Ordered in ED Medications  sodium chloride 0.9 % bolus 1,000 mL (1,000 mLs Intravenous New Bag/Given 05/07/23 2201)  morphine (PF) 4 MG/ML injection 4 mg (4 mg Intravenous Given 05/07/23 2201)  cefTRIAXone (ROCEPHIN) 1 g in sodium  chloride 0.9 % 100 mL IVPB (0 g Intravenous Stopped 05/07/23 2231)  levofloxacin (LEVAQUIN) IVPB 500 mg (500 mg Intravenous New Bag/Given 05/07/23 2248)    ED Course/ Medical Decision Making/ A&P                                 Medical Decision Making Amount and/or Complexity of Data Reviewed Labs: ordered. Radiology: ordered.  Risk Prescription drug management.   27 year old male with poorly controlled HIV presenting for testicular pain and swelling after possible abscess.  Patient is alert and oriented x 3, no acute distress, afebrile, stable vital signs.  Physical exam demonstrating 2 x 2 cm region of induration in bilateral testes followed by scrotal swelling.  Concerns for cellulitis and abscess.  Ultrasound pending.  Morphine ordered for  pain. Rocephin and levoquin given for likely abscess.    Ultrasound concerning for bilateral epididymitis versus abscess.  CT pelvis ordered. Patient signed out to oncoming provider while awaiting results.         Final Clinical Impression(s) / ED Diagnoses Final diagnoses:  None    Rx / DC Orders ED Discharge Orders     None         Franne Forts, DO 05/07/23 2351

## 2023-05-07 NOTE — ED Notes (Signed)
Pt unable to void at this time. 

## 2023-05-07 NOTE — ED Notes (Signed)
PA [name] in room to assess patient

## 2023-05-07 NOTE — ED Triage Notes (Signed)
Patient BIB POV from home with complaint of scrotum pain and swelling x 2 weeks. Patient reports believes may have 3 abscess on scrotum with no drainage. Reports scrotal sack is hard on right side of testicle.

## 2023-05-08 ENCOUNTER — Emergency Department (HOSPITAL_COMMUNITY): Payer: Medicare PPO

## 2023-05-08 DIAGNOSIS — N5089 Other specified disorders of the male genital organs: Secondary | ICD-10-CM | POA: Diagnosis not present

## 2023-05-08 DIAGNOSIS — N3289 Other specified disorders of bladder: Secondary | ICD-10-CM | POA: Diagnosis not present

## 2023-05-08 MED ORDER — LEVOFLOXACIN 500 MG PO TABS: 500.0000 mg | ORAL_TABLET | Freq: Every day | ORAL | 0 refills | Status: DC

## 2023-05-08 MED ORDER — OXYCODONE-ACETAMINOPHEN 5-325 MG PO TABS
1.0000 | ORAL_TABLET | Freq: Once | ORAL | Status: AC
  Administered 2023-05-08: 1 via ORAL
  Filled 2023-05-08: qty 1

## 2023-05-08 MED ORDER — IOHEXOL 350 MG/ML SOLN
75.0000 mL | Freq: Once | INTRAVENOUS | Status: AC | PRN
  Administered 2023-05-08: 75 mL via INTRAVENOUS

## 2023-05-08 MED ORDER — CEFTRIAXONE SODIUM 500 MG IJ SOLR
500.0000 mg | Freq: Once | INTRAMUSCULAR | Status: AC
  Administered 2023-05-08: 500 mg via INTRAMUSCULAR
  Filled 2023-05-08: qty 500

## 2023-05-08 MED ORDER — MORPHINE SULFATE (PF) 4 MG/ML IV SOLN
4.0000 mg | Freq: Once | INTRAVENOUS | Status: AC
  Administered 2023-05-08: 4 mg via INTRAVENOUS
  Filled 2023-05-08: qty 1

## 2023-05-08 MED ORDER — STERILE WATER FOR INJECTION IJ SOLN
INTRAMUSCULAR | Status: AC
  Filled 2023-05-08: qty 10

## 2023-05-08 MED ORDER — OXYCODONE-ACETAMINOPHEN 5-325 MG PO TABS: 2.0000 | ORAL_TABLET | Freq: Once | ORAL | Status: DC

## 2023-05-08 MED ORDER — OXYCODONE-ACETAMINOPHEN 5-325 MG PO TABS
1.0000 | ORAL_TABLET | ORAL | 0 refills | Status: DC | PRN
Start: 2023-05-08 — End: 2023-07-10

## 2023-05-08 NOTE — ED Provider Notes (Signed)
Was reviewed there is 1 in the kit as it is small function test charger somebody for different style 12:21 AM Assumed care from Dr. Leanna Sato, please see their note for full history, physical and decision making until this point. In brief this is a 27 y.o. year old adult who presented to the ED tonight with Groin Swelling     Born male. Has groin swelling (possible abscess on scrotum from 2 weeks ago, popped it)  now with cellulitis +/- abscess. US shows possible epidydimal abscess and sctoral thickening. Induration on legs. Getting CT to eval for abscess.   Pending CT for possibility of fourniers/abscess and Urology consult but will likely need admission.   CT was also concerning for epididymal abscesses however has cellulitis and scrotal cellulitis as well.  I discussed this with Dr. Laverle Patter who states that it EMV does not really experience with it.  He reviewed the ultrasound and CT scan himself and did not feel its need surgical intervention.  He suggested antibiotics, pain control and close follow-up in their office.  The patient had significant pain control for multiple hours and had already had antibiotics.  I discussed with pharmacy and decide on send him home on Levaquin after an IM dose of Rocephin here.  Cultures are pending.  Patient is pain-free at time of discharge.  No evidence of sepsis.  Follow-up information provided.  Discharge instructions, including strict return precautions for new or worsening symptoms, given. Patient and/or family verbalized understanding and agreement with the plan as described.   Labs, studies and imaging reviewed by myself and considered in medical decision making if ordered. Imaging interpreted by radiology.  Labs Reviewed  COMPREHENSIVE METABOLIC PANEL - Abnormal; Notable for the following components:      Result Value   Sodium 132 (*)    CO2 21 (*)    Glucose, Bld 136 (*)    Creatinine, Ser 1.25 (*)    Total Protein 9.0 (*)    Albumin 3.2 (*)    All  other components within normal limits  CBC WITH DIFFERENTIAL/PLATELET - Abnormal; Notable for the following components:   RBC 3.57 (*)    Hemoglobin 12.0 (*)    HCT 37.1 (*)    MCV 103.9 (*)    Eosinophils Absolute 0.7 (*)    All other components within normal limits  URINALYSIS, ROUTINE W REFLEX MICROSCOPIC  RPR  T-HELPER CELLS (CD4) COUNT (NOT AT [facility])  I-STAT CG4 LACTIC ACID, ED  I-STAT CG4 LACTIC ACID, ED  GC/CHLAMYDIA PROBE AMP ([place]) NOT AT [facility]    US SCROTUM W/DOPPLER  Final Result    CT PELVIS W CONTRAST    (Results Pending)    No follow-ups on file.    , Barbara Cower, MD 05/09/23 [phone]

## 2023-05-11 ENCOUNTER — Ambulatory Visit (INDEPENDENT_AMBULATORY_CARE_PROVIDER_SITE_OTHER): Payer: Medicare PPO | Admitting: Family

## 2023-05-11 ENCOUNTER — Other Ambulatory Visit (HOSPITAL_COMMUNITY)
Admission: RE | Admit: 2023-05-11 | Discharge: 2023-05-11 | Disposition: A | Discharge status: Home / Self Care | Payer: Medicare PPO | Source: Ambulatory Visit | Attending: Family | Admitting: Family

## 2023-05-11 ENCOUNTER — Other Ambulatory Visit: Payer: Self-pay

## 2023-05-11 ENCOUNTER — Encounter: Payer: Self-pay | Admitting: Family

## 2023-05-11 VITALS — BP 115/76 | HR 80 | Temp 97.5°F | Ht >= 80 in | Wt 167.0 lb

## 2023-05-11 DIAGNOSIS — Z113 Encounter for screening for infections with a predominantly sexual mode of transmission: Secondary | ICD-10-CM

## 2023-05-11 DIAGNOSIS — B20 Human immunodeficiency virus [HIV] disease: Secondary | ICD-10-CM

## 2023-05-11 DIAGNOSIS — Z789 Other specified health status: Secondary | ICD-10-CM

## 2023-05-11 DIAGNOSIS — Z0000 Encounter for general adult medical examination without abnormal findings: Secondary | ICD-10-CM

## 2023-05-11 MED ORDER — SYMTUZA 800-150-200-10 MG PO TABS
1.0000 | ORAL_TABLET | Freq: Every day | ORAL | 4 refills | Status: DC
Start: 2023-05-11 — End: 2023-07-10

## 2023-05-11 MED ORDER — SULFAMETHOXAZOLE-TRIMETHOPRIM 800-160 MG PO TABS
1.0000 | ORAL_TABLET | Freq: Two times a day (BID) | ORAL | 5 refills | Status: DC
Start: 2023-05-11 — End: 2023-07-10

## 2023-05-11 NOTE — Assessment & Plan Note (Signed)
[name] [name] continues to have well controlled virus with good adherence and tolerance to Symtuza. Congratulated on achieving undetectable status and challenged to continue taking medication and remain undetectable to reduce risk of disease progression and complications. Continues to be at risk for opportunistic infection with nearly undetectable CD4 count. Hopeful CD4 count will improved with continued suppression.  Check lab work. Continue current dose of Symtuza and Bactrim. Plan for follow up in 2 months or sooner if needed with lab work on the same day.

## 2023-05-11 NOTE — Assessment & Plan Note (Signed)
Discussed importance of safe sexual practice and condom use. Condoms and STD testing offered.  Vaccines reviewed and deferred until next office visit.

## 2023-05-11 NOTE — Patient Instructions (Signed)
Mental Health Resources  988: can call or text 24/7  [facility]: Address: [address], [place], [place] [address] Open 24 hours Phone: [phone]  [facility]: Address: [address], [place], [place] [address] Phone: [phone] Appointments: [email or web address]

## 2023-05-11 NOTE — Progress Notes (Signed)
Brief Narrative   Patient ID: [name] [name], adult    DOB: 09-May-1996, 27 y.o.   MRN: [id number]  [name] [name] is a 27 y/o transgender male diagnosed with HIV disease in March 2013 with risk factor of MSM. Initial viral load was 89,700 with CD4 count of 460. No history of opportunistic infection. Genosure with M184V (emtricitabine, lamivudine) and R263K. [id number] negative. ART experienced with Complera, Prezcobix, Descovy, Biktarvy and now Symtuza.   Subjective:    Chief Complaint  Patient presents with   Follow-up    Two abscesses on groin, was given abx in ED, improving   STI testing    HPI:  [name] [name] is a 27 y.o. adult with AIDS/HIV last seen on 03/10/23 with well controlled virus and good adherence and tolerance to Symtuza and Bactrim. VIral load was undetectable and CD4 count <20. RPR was positive and stable at 1:1. Here today for follow up.   [name] [name] has been doing well since her last office visit and continues to take her Symtuza and Bactrim as prescribed with no adverse side effects or problems obtaining medication. Continues to smoke tobacco daily. Working on potential employment. No new concerns/complaints with cellulitis continuing to resolve. Condoms and STD testing offered. Reviewed vaccinations and request to defer to next office visit.   Denies fevers, chills, night sweats, headaches, changes in vision, neck pain/stiffness, nausea, diarrhea, vomiting, lesions or rashes.    No Known Allergies    Outpatient Medications Prior to Visit  Medication Sig Dispense Refill   levofloxacin (LEVAQUIN) 500 MG tablet Take 1 tablet (500 mg total) by mouth daily. X 7 days 10 tablet 0   SYMTUZA 800-150-200-10 MG TABS Take 1 tablet by mouth daily. 30 tablet 4   oxyCODONE-acetaminophen (PERCOCET) 5-325 MG tablet Take 1-2 tablets by mouth every 4 (four) hours as needed. (Patient not taking: Reported on 05/11/2023) 30 tablet 0   No facility-administered medications prior to visit.      Past Medical History:  Diagnosis Date   Acute kidney failure (HCC) 04/2017   Anal fissure 2017   C. 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 anus (lgsil) 2013   Rectal gonorrhea    multiple episodes   Seizures (HCC) 2017   "probably stress related" (05/24/2017)   Syphilis    Syphilis, secondary 11/2011   tx'ed with IM PCN x 3     Past Surgical History:  Procedure Laterality Date   ANAL EXAMINATION UNDER ANESTHESIA     INCISION AND DRAINAGE ABSCESS Left 02/16/2019   Procedure: INCISION AND DRAINAGE ABSCESS;  Surgeon: Christia Reading, MD;  Location: [facility] OR;  Service: ENT;  Laterality: Left;   WISDOM TOOTH EXTRACTION        Review of Systems  Constitutional:  Negative for appetite change, chills, diaphoresis, fatigue, fever and unexpected weight change.  Eyes:        Negative for acute change in vision  Respiratory:  Negative for chest tightness, shortness of breath and wheezing.   Cardiovascular:  Negative for chest pain.  Gastrointestinal:  Negative for diarrhea, nausea and vomiting.  Genitourinary:  Negative for dysuria, pelvic pain and  vaginal discharge.  Musculoskeletal:  Negative for neck pain and neck stiffness.  Skin:  Negative for rash.  Neurological:  Negative for seizures, syncope, weakness and headaches.  Hematological:  Negative for adenopathy. Does not bruise/bleed easily.  Psychiatric/Behavioral:  Negative for hallucinations.       Objective:    BP 115/76   Pulse 80   Temp (!) 97.5 F (36.4 C) (Temporal)   Ht 6\' 10"  (2.083 m)   Wt 167 lb (75.8 kg)   SpO2 100%   BMI 17.46 kg/m  Nursing note and vital signs reviewed.  Physical  Exam Constitutional:      General: She is not in acute distress.    Appearance: She is well-developed.  Eyes:     Conjunctiva/sclera: Conjunctivae normal.  Cardiovascular:     Rate and Rhythm: Normal rate and regular rhythm.     Heart sounds: Normal heart sounds. No murmur heard.    No friction rub. No gallop.  Pulmonary:     Effort: Pulmonary effort is normal. No respiratory distress.     Breath sounds: Normal breath sounds. No wheezing or rales.  Chest:     Chest wall: No tenderness.  Abdominal:     General: Bowel sounds are normal.     Palpations: Abdomen is soft.     Tenderness: There is no abdominal tenderness.  Musculoskeletal:     Cervical back: Neck supple.  Lymphadenopathy:     Cervical: No cervical adenopathy.  Skin:    General: Skin is warm and dry.     Findings: No rash.  Neurological:     Mental Status: She is alert and oriented to person, place, and time.  Psychiatric:        Behavior: Behavior normal.        Thought Content: Thought content normal.        Judgment: Judgment normal.         05/11/2023   10:33 AM 03/10/2023   10:37 AM 02/07/2023    9:01 AM 04/08/2019    9:29 AM 02/05/2019    3:34 PM  Depression screen PHQ 2/9  Decreased Interest 2 0 0 0 0  Down, Depressed, Hopeless 2 1 1  0 0  PHQ - 2 Score 4 1 1  0 0  Altered sleeping 3      Tired, decreased energy 2      Change in appetite 3      Feeling bad or failure about yourself  2      Trouble concentrating 3      Moving slowly or fidgety/restless 1      Suicidal thoughts 3      PHQ-9 Score 21      Difficult doing work/chores Somewhat difficult           Assessment & Plan:    Patient Active Problem List   Diagnosis Date Noted   Polysubstance use disorder 03/10/2023   Male-to-male transgender person 02/07/2023   Poor social situation 02/07/2023   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   Healthcare maintenance 06/02/2020   Axillary abscess 02/13/2020   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 Congratulated on achieving undetectable status and challenged to continue taking medication and remain undetectable to reduce risk of disease progression and complications. Continues to be at risk for opportunistic infection with nearly undetectable CD4 count. Hopeful CD4 count will improved with continued suppression.  Check lab work. Continue current dose of Symtuza and Bactrim. Plan for follow up in 2 months or sooner if needed with lab work on the same day.       Relevant Medications   SYMTUZA 800-150-200-10 MG TABS   sulfamethoxazole-trimethoprim (BACTRIM DS) 800-160 MG tablet   Other Relevant Orders   BASIC METABOLIC PANEL WITH GFR   HIV-1 RNA quant-no reflex-bld   T-helper cell (CD4)- ([facility] clinic only)   Healthcare maintenance    Discussed importance of safe sexual practice and condom use. Condoms and STD testing offered.  Vaccines reviewed and deferred until next office visit.       Male-to-male transgender person - Primary   Other Visit Diagnoses     Screening for STDs (sexually transmitted diseases)       Relevant Orders   Cytology (oral, anal, urethral) ancillary only   Cytology (oral, anal, urethral) ancillary only   Urine cytology ancillary only   RPR        I am having Clifton Custard L. Ladona Ridgel "LaShea" start on sulfamethoxazole-trimethoprim. I am also having her maintain her levofloxacin,  oxyCODONE-acetaminophen, and Symtuza.   Meds ordered this encounter  Medications   SYMTUZA 800-150-200-10 MG TABS    Sig: Take 1 tablet by mouth daily.    Dispense:  30 tablet    Refill:  4    Order Specific Question:   Supervising Provider    Answer:   Drue Second, CYNTHIA [[id number]]   sulfamethoxazole-trimethoprim (BACTRIM DS) 800-160 MG tablet    Sig: Take 1 tablet by mouth 2 (two) times daily.    Dispense:  30 tablet    Refill:  5    Order Specific Question:   Supervising Provider    Answer:   Judyann Munson [[id number]]     Follow-up: Return in about 2 months (around 07/11/2023), or if symptoms worsen or fail to improve.   Marcos Eke, MSN, FNP-C Nurse Practitioner [facility] for Infectious Disease [facility] Main number: [phone]

## 2023-05-12 LAB — CYTOLOGY, (ORAL, ANAL, URETHRAL) ANCILLARY ONLY
Chlamydia: NEGATIVE
Chlamydia: NEGATIVE
Comment: NEGATIVE
Comment: NEGATIVE
Comment: NORMAL
Comment: NORMAL
Neisseria Gonorrhea: NEGATIVE
Neisseria Gonorrhea: NEGATIVE

## 2023-05-12 LAB — URINE CYTOLOGY ANCILLARY ONLY
Chlamydia: NEGATIVE
Comment: NEGATIVE
Comment: NORMAL
Neisseria Gonorrhea: NEGATIVE

## 2023-05-12 LAB — T-HELPER CELL (CD4) - (RCID CLINIC ONLY)
CD4 % Helper T Cell: 5 % — ABNORMAL LOW (ref 33–65)
CD4 T Cell Abs: 77 /uL — ABNORMAL LOW (ref 400–1790)

## 2023-05-15 ENCOUNTER — Telehealth: Payer: Self-pay

## 2023-05-15 LAB — BASIC METABOLIC PANEL WITH GFR
BUN/Creatinine Ratio: 11 (calc) (ref 6–22)
BUN: 16 mg/dL (ref 7–25)
CO2: 24 mmol/L (ref 20–32)
Calcium: 9.5 mg/dL (ref 8.6–10.3)
Chloride: 101 mmol/L (ref 98–110)
Creat: 1.41 mg/dL — ABNORMAL HIGH (ref 0.60–1.24)
Glucose, Bld: 69 mg/dL (ref 65–99)
Potassium: 5.1 mmol/L (ref 3.5–5.3)
Sodium: 136 mmol/L (ref 135–146)
eGFR: 69 mL/min/{1.73_m2} (ref >=60)

## 2023-05-15 LAB — HIV-1 RNA QUANT-NO REFLEX-BLD
HIV 1 RNA Quant: 596 {copies}/mL — ABNORMAL HIGH
HIV-1 RNA Quant, Log: 2.78 {Log_copies}/mL — ABNORMAL HIGH

## 2023-05-15 LAB — T PALLIDUM AB: T Pallidum Abs: POSITIVE — AB

## 2023-05-15 LAB — RPR TITER: RPR Titer: 1:1 {titer} — ABNORMAL HIGH

## 2023-05-15 LAB — RPR: RPR Ser Ql: REACTIVE — AB

## 2023-05-15 NOTE — Telephone Encounter (Signed)
Transition Care Management Follow-up Telephone Call Date of discharge and from where: 05/08/2023 [facility] How have you been since you were released from the hospital? Patient stated she was feeling much better. Any questions or concerns? No  Items Reviewed: Did the pt receive and understand the discharge instructions provided? Yes  Medications obtained and verified? Yes  Other? No  Any new allergies since your discharge? No  Dietary orders reviewed? Yes Do you have support at home? Yes   Follow up appointments reviewed:  PCP Hospital f/u appt confirmed? No  Scheduled to see  on  @ . Specialist Hospital f/u appt confirmed? Yes  Scheduled to see Tama Headings. Carver Fila, FNP on 05/11/2023 @ [facility] for Infectious Disease. Are transportation arrangements needed? No  If their condition worsens, is the pt aware to call PCP or go to the Emergency Dept.? Yes Was the patient provided with contact information for the PCP's office or ED? Yes Was to pt encouraged to call back with questions or concerns? Yes  Jese Comella [facility]  [facility] Population Health Community Resource Care Guide   [email or web address]  ?? [phone]   Website: [email or web address]  [email or web address]

## 2023-07-10 ENCOUNTER — Encounter: Payer: Self-pay | Admitting: Family

## 2023-07-10 ENCOUNTER — Other Ambulatory Visit: Payer: Self-pay

## 2023-07-10 ENCOUNTER — Ambulatory Visit (INDEPENDENT_AMBULATORY_CARE_PROVIDER_SITE_OTHER): Payer: Self-pay | Admitting: Family

## 2023-07-10 VITALS — BP 121/77 | HR 77 | Temp 97.5°F | Ht >= 80 in | Wt 175.0 lb

## 2023-07-10 DIAGNOSIS — Z23 Encounter for immunization: Secondary | ICD-10-CM

## 2023-07-10 DIAGNOSIS — B20 Human immunodeficiency virus [HIV] disease: Secondary | ICD-10-CM

## 2023-07-10 DIAGNOSIS — A528 Late syphilis, latent: Secondary | ICD-10-CM

## 2023-07-10 DIAGNOSIS — Z0000 Encounter for general adult medical examination without abnormal findings: Secondary | ICD-10-CM

## 2023-07-10 MED ORDER — SULFAMETHOXAZOLE-TRIMETHOPRIM 800-160 MG PO TABS
1.0000 | ORAL_TABLET | Freq: Two times a day (BID) | ORAL | 5 refills | Status: DC
Start: 2023-07-10 — End: 2023-10-20

## 2023-07-10 MED ORDER — SYMTUZA 800-150-200-10 MG PO TABS
1.0000 | ORAL_TABLET | Freq: Every day | ORAL | 5 refills | Status: DC
Start: 2023-07-10 — End: 2023-10-20

## 2023-07-10 NOTE — Patient Instructions (Addendum)
Nice to see you.  We will check your lab work today.  Continue to take your medication daily as prescribed.  Refills have been sent to the pharmacy.  Plan for follow up in 3 months or sooner if needed with lab work on the same day.  Have a great day and stay safe!

## 2023-07-10 NOTE — Assessment & Plan Note (Signed)
Previously treated with titer of 1:64 and last RPR was 1:1 with 1 non-reactive in between. I think this is a continuation of previous titer and does not represent new infection. Check RPR and continue to monitor for now.

## 2023-07-10 NOTE — Assessment & Plan Note (Signed)
[name] [name] has improved viral control and CD4 count which is encouraging. Reviewed previous lab work and discussed plan of care, U equals U, and continued need for Bactrim for OI prophylaxis. Check lab work. Continue current dose of Symtuza and Bactrim. Will continue to work with [employer] for now. Plan for follow up in 3 months or sooner if needed with lab work on the same day.

## 2023-07-10 NOTE — Progress Notes (Signed)
Brief Narrative   Patient ID: [name] [name], adult    DOB: 04/02/1996, 27 y.o.   MRN: [id number]  [name] [name] is a 27 y/o transgender male diagnosed with HIV disease in March 2013 with risk factor of MSM. Initial viral load was 89,700 with CD4 count of 460. No history of opportunistic infection. Genosure with M184V (emtricitabine, lamivudine) and R263K. [id number] negative. ART experienced with Complera, Prezcobix, Descovy, Biktarvy and now Symtuza.   Subjective:    Chief Complaint  Patient presents with   Follow-up    HPI:  [name] [name] is a 27 y.o. adult with HIV disease last seen on 05/11/23 with improved viral control and good adherence and tolerance to Symtuza and Bactrim.  Viral load was 596 and CD4 count 77. Kidney function, liver function and electrolytes within normal ranges. RPR at 1:1 from previous non-reactive with prior treatment at 1:64. Here today for routine follow up.  [name] [name] has been doing okay since her last office visit. Has been taking her Symtuza and Bactrim with a "few" missed doses and no problems obtaining medication from the pharmacy. Started a job at [employer] however was too much and now starting a new job at [employer]. Continues to work with [employer] from [facility] and is focused on completing paperwork for food assistance and housing. Condoms and STD testing offered. Vaccinations reviewed.   Denies fevers, chills, night sweats, headaches, changes in vision, neck pain/stiffness, nausea, diarrhea, vomiting, lesions or rashes.  Lab Results  Component Value Date   CD4TCELL 5 (L) 05/11/2023   CD4TABS 77 (L) 05/11/2023   Lab Results  Component Value Date   HIV1RNAQUANT 596 (H) 05/11/2023     No Known Allergies    Outpatient Medications Prior to Visit  Medication Sig Dispense Refill   sulfamethoxazole-trimethoprim (BACTRIM DS) 800-160 MG tablet Take 1 tablet by mouth 2 (two) times daily. 30 tablet 5   SYMTUZA 800-150-200-10 MG TABS Take 1 tablet  by mouth daily. 30 tablet 4   levofloxacin (LEVAQUIN) 500 MG tablet Take 1 tablet (500 mg total) by mouth daily. X 7 days (Patient not taking: Reported on 07/10/2023) 10 tablet 0   oxyCODONE-acetaminophen (PERCOCET) 5-325 MG tablet Take 1-2 tablets by mouth every 4 (four) hours as needed. (Patient not taking: Reported on 05/11/2023) 30 tablet 0   No facility-administered medications prior to visit.     Past Medical History:  Diagnosis Date   Acute kidney failure (HCC) 04/2017   Anal fissure 2017   C. 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 anus (lgsil) 2013   Rectal gonorrhea    multiple episodes   Seizures (HCC) 2017   "probably stress related" (05/24/2017)   Syphilis    Syphilis, secondary 11/2011   tx'ed with IM PCN x 3     Past Surgical History:  Procedure Laterality Date   ANAL EXAMINATION UNDER ANESTHESIA     INCISION AND DRAINAGE ABSCESS Left 02/16/2019   Procedure: INCISION AND DRAINAGE ABSCESS;  Surgeon: Christia Reading, MD;  Location: [facility] OR;  Service: ENT;  Laterality: Left;   WISDOM TOOTH EXTRACTION  Review of Systems  Constitutional:  Negative for appetite change, chills, diaphoresis, fatigue, fever and unexpected weight change.  Eyes:        Negative for acute change in vision  Respiratory:  Negative for chest tightness, shortness of breath and wheezing.   Cardiovascular:  Negative for chest pain.  Gastrointestinal:  Negative for diarrhea, nausea and vomiting.  Genitourinary:  Negative for dysuria, pelvic pain and vaginal discharge.  Musculoskeletal:  Negative for neck pain and neck stiffness.  Skin:  Negative for rash.  Neurological:  Negative for  seizures, syncope, weakness and headaches.  Hematological:  Negative for adenopathy. Does not bruise/bleed easily.  Psychiatric/Behavioral:  Negative for hallucinations.       Objective:    BP 121/77   Pulse 77   Temp (!) 97.5 F (36.4 C) (Temporal)   Ht 6\' 10"  (2.083 m)   Wt 175 lb (79.4 kg)   SpO2 100%   BMI 18.30 kg/m  Nursing note and vital signs reviewed.  Physical Exam Constitutional:      General: She is not in acute distress.    Appearance: She is well-developed.  Eyes:     Conjunctiva/sclera: Conjunctivae normal.  Cardiovascular:     Rate and Rhythm: Normal rate and regular rhythm.     Heart sounds: Normal heart sounds. No murmur heard.    No friction rub. No gallop.  Pulmonary:     Effort: Pulmonary effort is normal. No respiratory distress.     Breath sounds: Normal breath sounds. No wheezing or rales.  Chest:     Chest wall: No tenderness.  Abdominal:     General: Bowel sounds are normal.     Palpations: Abdomen is soft.     Tenderness: There is no abdominal tenderness.  Musculoskeletal:     Cervical back: Neck supple.  Lymphadenopathy:     Cervical: No cervical adenopathy.  Skin:    General: Skin is warm and dry.     Findings: No rash.  Neurological:     Mental Status: She is alert and oriented to person, place, and time.  Psychiatric:        Behavior: Behavior normal.        Thought Content: Thought content normal.        Judgment: Judgment normal.         07/10/2023   10:54 AM 05/11/2023   10:33 AM 03/10/2023   10:37 AM 02/07/2023    9:01 AM 04/08/2019    9:29 AM  Depression screen PHQ 2/9  Decreased Interest 0 2 0 0 0  Down, Depressed, Hopeless 0 2 1 1  0  PHQ - 2 Score 0 4 1 1  0  Altered sleeping  3     Tired, decreased energy  2     Change in appetite  3     Feeling bad or failure about yourself   2     Trouble concentrating  3     Moving slowly or fidgety/restless  1     Suicidal thoughts  3     PHQ-9 Score  21     Difficult doing  work/chores  Somewhat difficult          Assessment & Plan:    Patient Active Problem List   Diagnosis Date Noted   Polysubstance use disorder 03/10/2023   Male-to-male transgender person 02/07/2023   Poor social situation 02/07/2023   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   Healthcare maintenance 06/02/2020   Axillary abscess 02/13/2020  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, 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   Enteropathogenic Escherichia coli infection 05/26/2017   Acute renal failure (ARF) (HCC) 05/24/2017   Abnormal brain MRI 02/07/2017   Rash 11/15/2016   Hypotension 11/05/2016   Shingles 09/14/2016   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 Reviewed previous lab work and discussed plan of care, U equals U, and continued need for Bactrim for OI prophylaxis. Check lab work. Continue current dose of Symtuza and Bactrim. Will continue to work with [employer] for now. Plan for follow up in 3 months or sooner if needed with lab work on the same day.      Relevant Medications   SYMTUZA 800-150-200-10 MG TABS   sulfamethoxazole-trimethoprim (BACTRIM DS) 800-160 MG tablet   Other Relevant Orders   COMPLETE METABOLIC PANEL WITH GFR   HIV-1 RNA quant-no reflex-bld   T-helper cell (CD4)- ([facility] clinic only)   Late latent syphilis    Previously treated with titer of 1:64 and last RPR was 1:1 with 1 non-reactive in between. I think this is a continuation of previous titer and does not represent new infection. Check RPR and continue to monitor for now.       Relevant Medications   SYMTUZA 800-150-200-10 MG TABS   sulfamethoxazole-trimethoprim (BACTRIM DS) 800-160 MG tablet   Other Relevant  Orders   RPR   Healthcare maintenance    Discussed importance of safe sexual practice and condom use. Condoms and STD testing offered.  Influenza and Covid vaccinations updated.  Due for routine dental care and will work with [facility] to schedule.       Other Visit Diagnoses     Encounter for immunization       Relevant Orders   Flu vaccine trivalent PF, 6mos and older(Flulaval,Afluria,Fluarix,Fluzone) (Completed)   Pfizer Comirnaty Covid-19 Vaccine 66yrs & older (Completed)        I have discontinued Clifton Custard L. Osika "[name] [name]"'s levofloxacin and oxyCODONE-acetaminophen. I am also having her maintain her Symtuza and sulfamethoxazole-trimethoprim.   Meds ordered this encounter  Medications   SYMTUZA 800-150-200-10 MG TABS    Sig: Take 1 tablet by mouth daily.    Dispense:  30 tablet    Refill:  5    Order Specific Question:   Supervising Provider    Answer:   Drue Second, CYNTHIA [[id number]]   sulfamethoxazole-trimethoprim (BACTRIM DS) 800-160 MG tablet    Sig: Take 1 tablet by mouth 2 (two) times daily.    Dispense:  30 tablet    Refill:  5    Order Specific Question:   Supervising Provider    Answer:   Judyann Munson [[id number]]     Follow-up: Return in about 3 months (around 10/10/2023), or if symptoms worsen or fail to improve. or sooner if needed.    Marcos Eke, MSN, FNP-C Nurse Practitioner [facility] for Infectious Disease [facility] Main number: [phone]

## 2023-07-10 NOTE — Assessment & Plan Note (Signed)
Discussed importance of safe sexual practice and condom use. Condoms and STD testing offered.  Influenza and Covid vaccinations updated.  Due for routine dental care and will work with [facility] to schedule.

## 2023-07-11 LAB — T-HELPER CELL (CD4) - (RCID CLINIC ONLY)
CD4 % Helper T Cell: 6 % — ABNORMAL LOW (ref 33–65)
CD4 T Cell Abs: 78 /uL — ABNORMAL LOW (ref 400–1790)

## 2023-07-12 ENCOUNTER — Telehealth: Payer: Medicaid Other | Admitting: Physician Assistant

## 2023-07-12 DIAGNOSIS — R0602 Shortness of breath: Secondary | ICD-10-CM

## 2023-07-12 DIAGNOSIS — L02213 Cutaneous abscess of chest wall: Secondary | ICD-10-CM

## 2023-07-12 LAB — RPR: RPR Ser Ql: NONREACTIVE

## 2023-07-12 LAB — COMPLETE METABOLIC PANEL WITH GFR
AG Ratio: 0.7 (calc) — ABNORMAL LOW (ref 1.0–2.5)
ALT: 8 U/L — ABNORMAL LOW (ref 9–46)
AST: 13 U/L (ref 10–40)
Albumin: 3.8 g/dL (ref 3.6–5.1)
Alkaline phosphatase (APISO): 96 U/L (ref 36–130)
BUN: 12 mg/dL (ref 7–25)
CO2: 27 mmol/L (ref 20–32)
Calcium: 9.2 mg/dL (ref 8.6–10.3)
Chloride: 103 mmol/L (ref 98–110)
Creat: 1.1 mg/dL (ref 0.60–1.24)
Globulin: 5.4 g/dL — ABNORMAL HIGH (ref 1.9–3.7)
Glucose, Bld: 83 mg/dL (ref 65–99)
Potassium: 3.9 mmol/L (ref 3.5–5.3)
Sodium: 139 mmol/L (ref 135–146)
Total Bilirubin: 0.3 mg/dL (ref 0.2–1.2)
Total Protein: 9.2 g/dL — ABNORMAL HIGH (ref 6.1–8.1)
eGFR: 93 mL/min/{1.73_m2} (ref >=60)

## 2023-07-12 LAB — HIV-1 RNA QUANT-NO REFLEX-BLD
HIV 1 RNA Quant: 263 {copies}/mL — ABNORMAL HIGH
HIV-1 RNA Quant, Log: 2.42 {Log} — ABNORMAL HIGH

## 2023-07-12 NOTE — Progress Notes (Signed)
Because of difficulty breathing raising concern for a more complicated infection and need for examination, I feel your condition warrants further evaluation and I recommend that you be seen in a face to face visit.   NOTE: There will be NO CHARGE for this eVisit   If you are having a true medical emergency please call 911.      For an urgent face to face visit, [place] has eight urgent care centers for your convenience:   NEW!! [facility] at [facility] Get Driving Directions [phone] [address] [place], [address]    [facility] Health Urgent Care Center at [facility] Get Driving Directions [phone] [address] [place], [place] [address]   [facility] Urgent Care Center [facility]) Get Driving Directions [phone] [address] [place], [place] [address]  [facility] Health Urgent Care Center [facility]) Get Driving Directions [phone] [address] [place],  [place]  [address]  [facility] Health Urgent Care Center [facility] - at [employer]  [phone] [id number] [place],  [place] [address]   [facility] Health Urgent Care at [facility] Get Driving Directions [phone] [address] [place], [place] [address]   [facility] Health Urgent Care at [facility] Get Driving Directions  [phone] [address] [place], [place] [address]   [facility] Health Urgent Care at [facility] Directions [phone] [address], [address] [place], [place] [address]  Your MyChart E-visit questionnaire answers were reviewed by a board certified advanced clinical practitioner to complete your personal care plan based on your specific symptoms.  Thank you for using e-Visits.

## 2023-07-14 ENCOUNTER — Telehealth: Payer: Self-pay

## 2023-07-14 NOTE — Telephone Encounter (Signed)
Detectable Viral Load Intervention (DVL)  Most recent VL:  HIV 1 RNA Quant  Date Value Ref Range Status  07/10/2023 263 (H) Copies/mL Final  05/11/2023 596 (H) Copies/mL Final  03/10/2023 172 (H) Copies/mL Final    Last Clinic Visit: 07/10/23  Current ART regimen: Symtuza  Appointment status: patient has future appointment scheduled  Medication last dispensed (per chart review):   Dispensed Days Supply Quantity Provider Pharmacy  Symtuza 800 mg-150 mg-200 mg-10 mg tablet 03/31/2023 30 30 tablet Veryl Speak, FNP [facility]...   Medication Adherence   Not able to assess Barriers to Care   Not able to assess  Interventions   Called patient to discuss medication adherence and possible barriers to care. Not able to reach patient at this time. Vl has decreased since last checked. Will continue to follow. Juanita Laster, RMA

## 2023-10-15 ENCOUNTER — Other Ambulatory Visit: Payer: Self-pay

## 2023-10-15 ENCOUNTER — Encounter (HOSPITAL_COMMUNITY): Payer: Self-pay

## 2023-10-15 ENCOUNTER — Observation Stay (HOSPITAL_COMMUNITY)
Admission: EM | Admit: 2023-10-15 | Discharge: 2023-10-15 | DRG: 603 | Discharge status: Against Medical Advice | Payer: Medicaid Other | Attending: Internal Medicine | Admitting: Internal Medicine

## 2023-10-15 ENCOUNTER — Emergency Department (HOSPITAL_COMMUNITY): Payer: Medicaid Other

## 2023-10-15 DIAGNOSIS — Z5329 Procedure and treatment not carried out because of patient's decision for other reasons: Secondary | ICD-10-CM | POA: Diagnosis not present

## 2023-10-15 DIAGNOSIS — L03211 Cellulitis of face: Principal | ICD-10-CM

## 2023-10-15 DIAGNOSIS — B9561 Methicillin susceptible Staphylococcus aureus infection as the cause of diseases classified elsewhere: Secondary | ICD-10-CM | POA: Diagnosis present

## 2023-10-15 DIAGNOSIS — L0201 Cutaneous abscess of face: Principal | ICD-10-CM

## 2023-10-15 DIAGNOSIS — F17210 Nicotine dependence, cigarettes, uncomplicated: Secondary | ICD-10-CM | POA: Diagnosis present

## 2023-10-15 DIAGNOSIS — B20 Human immunodeficiency virus [HIV] disease: Secondary | ICD-10-CM | POA: Diagnosis present

## 2023-10-15 DIAGNOSIS — Z8249 Family history of ischemic heart disease and other diseases of the circulatory system: Secondary | ICD-10-CM

## 2023-10-15 DIAGNOSIS — M272 Inflammatory conditions of jaws: Secondary | ICD-10-CM | POA: Diagnosis not present

## 2023-10-15 LAB — COMPREHENSIVE METABOLIC PANEL
ALT: 13 U/L (ref 0–44)
AST: 19 U/L (ref 15–41)
Albumin: 3.6 g/dL (ref 3.5–5.0)
Alkaline Phosphatase: 74 U/L (ref 38–126)
Anion gap: 10 (ref 5–15)
BUN: 12 mg/dL (ref 6–20)
CO2: 24 mmol/L (ref 22–32)
Calcium: 9.3 mg/dL (ref 8.9–10.3)
Chloride: 100 mmol/L (ref 98–111)
Creatinine, Ser: 1.22 mg/dL (ref 0.61–1.24)
GFR, Estimated: >60 mL/min (ref >60)
Glucose, Bld: 103 mg/dL — ABNORMAL HIGH (ref 70–99)
Potassium: 3.4 mmol/L — ABNORMAL LOW (ref 3.5–5.1)
Sodium: 134 mmol/L — ABNORMAL LOW (ref 135–145)
Total Bilirubin: 0.6 mg/dL (ref 0.0–1.2)
Total Protein: 9.1 g/dL — ABNORMAL HIGH (ref 6.5–8.1)

## 2023-10-15 LAB — I-STAT CG4 LACTIC ACID, ED
Lactic Acid, Venous: 0.6 mmol/L (ref 0.5–1.9)
Lactic Acid, Venous: 0.5 mmol/L (ref 0.5–1.9)

## 2023-10-15 LAB — CBC WITH DIFFERENTIAL/PLATELET
Abs Immature Granulocytes: 0.02 10*3/uL (ref 0.00–0.07)
Basophils Absolute: 0 10*3/uL (ref 0.0–0.1)
Basophils Relative: 0 %
Eosinophils Absolute: 1.1 10*3/uL — ABNORMAL HIGH (ref 0.0–0.5)
Eosinophils Relative: 14 %
HCT: 37.2 % — ABNORMAL LOW (ref 39.0–52.0)
Hemoglobin: 12.5 g/dL — ABNORMAL LOW (ref 13.0–17.0)
Immature Granulocytes: 0 %
Lymphocytes Relative: 23 %
Lymphs Abs: 1.7 10*3/uL (ref 0.7–4.0)
MCH: 32.3 pg (ref 26.0–34.0)
MCHC: 33.6 g/dL (ref 30.0–36.0)
MCV: 96.1 fL (ref 80.0–100.0)
Monocytes Absolute: 0.4 10*3/uL (ref 0.1–1.0)
Monocytes Relative: 6 %
Neutro Abs: 4.4 10*3/uL (ref 1.7–7.7)
Neutrophils Relative %: 57 %
Platelets: 259 10*3/uL (ref 150–400)
RBC: 3.87 MIL/uL — ABNORMAL LOW (ref 4.22–5.81)
RDW: 12.1 % (ref 11.5–15.5)
WBC: 7.7 10*3/uL (ref 4.0–10.5)
nRBC: 0 % (ref 0.0–0.2)

## 2023-10-15 LAB — MRSA NEXT GEN BY PCR, NASAL: MRSA by PCR Next Gen: NOT DETECTED

## 2023-10-15 MED ORDER — ONDANSETRON HCL 4 MG PO TABS: 4.0000 mg | ORAL_TABLET | Freq: Four times a day (QID) | ORAL | Status: DC | PRN

## 2023-10-15 MED ORDER — SULFAMETHOXAZOLE-TRIMETHOPRIM 800-160 MG PO TABS
1.0000 | ORAL_TABLET | Freq: Two times a day (BID) | ORAL | Status: DC
  Administered 2023-10-15: 1 via ORAL
  Filled 2023-10-15 (×2): qty 1

## 2023-10-15 MED ORDER — VANCOMYCIN HCL IN DEXTROSE 1-5 GM/200ML-% IV SOLN: 1000.0000 mg | Freq: Once | INTRAVENOUS | Status: DC

## 2023-10-15 MED ORDER — SODIUM CHLORIDE 0.9 % IV SOLN
3.0000 g | Freq: Four times a day (QID) | INTRAVENOUS | Status: DC
  Administered 2023-10-15: 3 g via INTRAVENOUS
  Filled 2023-10-15: qty 8

## 2023-10-15 MED ORDER — DARUN-COBIC-EMTRICIT-TENOFAF 800-150-200-10 MG PO TABS
1.0000 | ORAL_TABLET | Freq: Every day | ORAL | Status: DC
  Administered 2023-10-15: 1 via ORAL
  Filled 2023-10-15: qty 1

## 2023-10-15 MED ORDER — KETOROLAC TROMETHAMINE 30 MG/ML IJ SOLN
30.0000 mg | Freq: Once | INTRAMUSCULAR | Status: AC
  Administered 2023-10-15: 30 mg via INTRAVENOUS
  Filled 2023-10-15: qty 1

## 2023-10-15 MED ORDER — IOHEXOL 350 MG/ML SOLN
50.0000 mL | Freq: Once | INTRAVENOUS | Status: AC | PRN
  Administered 2023-10-15: 50 mL via INTRAVENOUS

## 2023-10-15 MED ORDER — SODIUM CHLORIDE 0.9 % IV SOLN
2.0000 g | Freq: Once | INTRAVENOUS | Status: AC
  Administered 2023-10-15: 2 g via INTRAVENOUS
  Filled 2023-10-15: qty 12.5

## 2023-10-15 MED ORDER — ACETAMINOPHEN 650 MG RE SUPP: 650.0000 mg | Freq: Four times a day (QID) | RECTAL | Status: DC | PRN

## 2023-10-15 MED ORDER — ENOXAPARIN SODIUM 40 MG/0.4ML IJ SOSY: 40.0000 mg | PREFILLED_SYRINGE | INTRAMUSCULAR | Status: DC

## 2023-10-15 MED ORDER — DEXAMETHASONE SODIUM PHOSPHATE 10 MG/ML IJ SOLN
10.0000 mg | Freq: Once | INTRAMUSCULAR | Status: AC
  Administered 2023-10-15: 10 mg via INTRAVENOUS
  Filled 2023-10-15: qty 1

## 2023-10-15 MED ORDER — VANCOMYCIN HCL 1750 MG/350ML IV SOLN
1750.0000 mg | Freq: Once | INTRAVENOUS | Status: AC
  Administered 2023-10-15: 1750 mg via INTRAVENOUS
  Filled 2023-10-15: qty 350

## 2023-10-15 MED ORDER — ONDANSETRON HCL 4 MG/2ML IJ SOLN: 4.0000 mg | Freq: Four times a day (QID) | INTRAMUSCULAR | Status: DC | PRN

## 2023-10-15 MED ORDER — ACETAMINOPHEN 325 MG PO TABS: 650.0000 mg | ORAL_TABLET | Freq: Four times a day (QID) | ORAL | Status: DC | PRN

## 2023-10-15 MED ORDER — POTASSIUM CHLORIDE CRYS ER 20 MEQ PO TBCR
40.0000 meq | EXTENDED_RELEASE_TABLET | Freq: Once | ORAL | Status: AC
  Administered 2023-10-15: 40 meq via ORAL
  Filled 2023-10-15: qty 2

## 2023-10-15 MED ORDER — SODIUM CHLORIDE 0.9 % IV SOLN: 1.0000 g | INTRAVENOUS | Status: DC

## 2023-10-15 NOTE — Progress Notes (Signed)
Pharmacy Antibiotic Note  [name] [name] is a 28 y.o. adult admitted on 10/15/2023 with facial cellulitis/abscess. Pharmacy has been consulted for Unasyn dosing. PMH includes HIV (symtuza) and bactrim DS for OI prophylaxis  -Cefepime/Vanc x1 in the ED -WBC 7.7, sCr 1.22, afebrile -Symtuza PTA, Last CD4 77 (10/24) -Blood cultures collected  Plan: -Unasyn 3g IV every 6 hours -PTA Bactrim DS PO BID  -Symtuza PO daily -Monitor renal function -Follow up signs of clinical improvement, LOT, de-escalation of antibiotics  -Follow up HIV RNA, CD4 count  Height: 6\' 8"  (203.2 cm) Weight: 79.4 kg (175 lb) IBW/kg (Calculated) : 96  Temp (24hrs), Avg:98.6 F (37 C), Min:98.6 F (37 C), Max:98.6 F (37 C)  Recent Labs  Lab 10/15/23 0339 10/15/23 0343 10/15/23 0547  WBC 7.7  --   --   CREATININE 1.22  --   --   LATICACIDVEN  --  0.6 0.5    Estimated Creatinine Clearance (by C-G formula based on SCr of 1.22 mg/dL) Male: 16.1 mL/min Male: 100.3 mL/min    No Known Allergies  Antimicrobials this admission: Cefepime/Vanc x1  Unasyn 1/19 >>   Microbiology results: 1/19 BCx:  1/19 MRSA PCR:   Thank you for allowing pharmacy to be a part of this patient's care.  Arabella Merles, PharmD. Clinical Pharmacist 10/15/2023 6:19 AM

## 2023-10-15 NOTE — Consult Note (Addendum)
ENT CONSULT:  Reason for Consult: submental abscess and cellulitis  HPI: [name] [name] is an 28 y.o. adult  w/ h/o HIV/AIDS and prior multiple skin abscesses now with L facial and submental cellulitis and abscess. ENT consulted for possible surgical management.  History obtained from chart review and patient. 2 week history of left lower face and submental swelling, initially went down and then increased. Abx naive. No fevers, night sweats, B symptoms. Feels consistent with prior infections. No drainage. No significant pain, but reports that the tightness has improved after antibiotics given in ED. She does have a detectable viral load and has not been taking antiretrovirals consistently. Chart review shows multiple cutaneous abscesses of face prior and other parts of body, generally growing MSSA.   I did review the history, imaging, and labs interpreted these personally.   Past Medical History:  Diagnosis Date   Acute kidney failure (HCC) 04/2017   Anal fissure 2017   C. 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 anus (lgsil) 2013   Rectal gonorrhea    multiple episodes   Seizures (HCC) 2017   "probably stress related" (05/24/2017)   Syphilis    Syphilis, secondary 11/2011   tx'ed with IM PCN x 3    Past Surgical History:  Procedure Laterality Date   ANAL EXAMINATION UNDER ANESTHESIA     INCISION AND DRAINAGE ABSCESS Left 02/16/2019   Procedure: INCISION AND DRAINAGE ABSCESS;  Surgeon: Christia Reading, MD;  Location: [facility] OR;  Service: ENT;  Laterality: Left;   WISDOM TOOTH EXTRACTION      Family History  Problem Relation Age of Onset   Hypertension Maternal Grandmother      Social History:  reports that she has been smoking cigarettes. She has a 1.2 pack-year smoking history. She has never used smokeless tobacco. She reports current alcohol use of about 70.0 standard drinks of alcohol per week. She reports that she does not currently use drugs after having used the following drugs: Marijuana, Cocaine, and MDMA (Ecstacy). Frequency: 7.00 times per week.  Allergies: No Known Allergies  Medications: I have reviewed the patient's current medications.  Results for orders placed or performed during the hospital encounter of 10/15/23 (from the past 48 hours)  Comprehensive metabolic panel     Status: Abnormal   Collection Time: 10/15/23  3:39 AM  Result Value Ref Range   Sodium 134 (L) 135 - 145 mmol/L   Potassium 3.4 (L) 3.5 - 5.1 mmol/L   Chloride 100 98 - 111 mmol/L   CO2 24 22 - 32 mmol/L   Glucose, Bld 103 (H) 70 - 99 mg/dL    Comment: Glucose reference range applies only to samples taken after fasting for at least 8 hours.   BUN 12 6 - 20 mg/dL   Creatinine, Ser 5.78 0.61 - 1.24 mg/dL   Calcium 9.3 8.9 - 46.9 mg/dL   Total Protein 9.1 (H) 6.5 - 8.1 g/dL   Albumin 3.6 3.5 - 5.0 g/dL   AST 19 15 - 41 U/L   ALT 13 0 - 44 U/L   Alkaline Phosphatase 74 38 - 126 U/L   Total  Bilirubin 0.6 0.0 - 1.2 mg/dL   GFR, Estimated >86 >57 mL/min    Comment: (NOTE) Calculated using the CKD-EPI Creatinine Equation (2021)    Anion gap 10 5 - 15    Comment: Performed at [facility], [address] [place], [place] [address]  CBC with Differential     Status: Abnormal   Collection Time: 10/15/23  3:39 AM  Result Value Ref Range   WBC 7.7 4.0 - 10.5 K/uL   RBC 3.87 (L) 4.22 - 5.81 MIL/uL   Hemoglobin 12.5 (L) 13.0 - 17.0 g/dL   HCT 29.5 (L) 28.4 - 13.2 %   MCV 96.1 80.0 - 100.0 fL   MCH 32.3 26.0 - 34.0 pg   MCHC 33.6 30.0 - 36.0 g/dL   RDW 44.0 10.2 - 72.5 %   Platelets 259 150 - 400 K/uL   nRBC 0.0 0.0 - 0.2 %   Neutrophils Relative % 57 %   Neutro  Abs 4.4 1.7 - 7.7 K/uL   Lymphocytes Relative 23 %   Lymphs Abs 1.7 0.7 - 4.0 K/uL   Monocytes Relative 6 %   Monocytes Absolute 0.4 0.1 - 1.0 K/uL   Eosinophils Relative 14 %   Eosinophils Absolute 1.1 (H) 0.0 - 0.5 K/uL   Basophils Relative 0 %   Basophils Absolute 0.0 0.0 - 0.1 K/uL   Immature Granulocytes 0 %   Abs Immature Granulocytes 0.02 0.00 - 0.07 K/uL    Comment: Performed at [facility], [address] [place], [place] [address]  I-Stat Lactic Acid, ED     Status: None   Collection Time: 10/15/23  3:43 AM  Result Value Ref Range   Lactic Acid, Venous 0.6 0.5 - 1.9 mmol/L  Blood culture (routine x 2)     Status: None (Preliminary result)   Collection Time: 10/15/23  4:10 AM   Specimen: BLOOD RIGHT FOREARM  Result Value Ref Range   Specimen Description BLOOD RIGHT FOREARM    Special Requests      BOTTLES DRAWN AEROBIC AND ANAEROBIC Blood Culture results may not be optimal due to an inadequate volume of blood received in culture bottles Performed at [facility], [address], [place], [place] [address]    Culture PENDING    Report Status PENDING   Blood culture (routine x 2)     Status: None (Preliminary result)   Collection Time: 10/15/23  4:38 AM   Specimen: BLOOD RIGHT FOREARM  Result Value Ref Range   Specimen Description BLOOD RIGHT FOREARM    Special Requests      BOTTLES DRAWN AEROBIC AND ANAEROBIC Blood Culture results may not be optimal due to an inadequate volume of blood received in culture bottles Performed at [facility], [address], [place], [place] [address]    Culture PENDING    Report Status PENDING   I-Stat Lactic Acid, ED     Status: None   Collection Time: 10/15/23  5:47 AM  Result Value Ref Range   Lactic Acid, Venous 0.5 0.5 - 1.9 mmol/L  MRSA Next Gen by PCR, Nasal     Status: None   Collection Time: 10/15/23  6:51 AM   Specimen: Nasal Mucosa; Nasal Swab  Result Value Ref Range   MRSA by PCR Next Gen NOT  DETECTED NOT DETECTED    Comment: (NOTE) The GeneXpert MRSA Assay (FDA approved for NASAL specimens only), is one component of a comprehensive MRSA colonization  surveillance program. It is not intended to diagnose MRSA infection nor to guide or monitor treatment for MRSA infections. Test performance is not FDA approved in patients less than 100 years old. Performed at [facility], [address] [place], [place] [address]     CT Maxillofacial W Contrast Result Date: 10/15/2023 CLINICAL DATA:  Swelling on the left lower jaw EXAM: CT MAXILLOFACIAL WITH CONTRAST TECHNIQUE: Multidetector CT imaging of the maxillofacial structures was performed with intravenous contrast. Multiplanar CT image reconstructions were also generated. RADIATION DOSE REDUCTION: This exam was performed according to the departmental dose-optimization program which includes automated exposure control, adjustment of the mA and/or kV according to patient size and/or use of iterative reconstruction technique. CONTRAST:  50mL OMNIPAQUE IOHEXOL 350 MG/ML SOLN COMPARISON:  02/09/2019 FINDINGS: Osseous: No fracture or destructive lesion. No odontogenic source seen underlying the soft tissue inflammation. Orbits: Negative. No traumatic or inflammatory finding. Sinuses: Generalized opacification of the paranasal sinuses, mucosal thickening and fluid greatest at the left maxilla. There is atelectasis, sclerotic wall thickening, and dystrophic calcification at the right maxillary sinus. Soft tissues: Subcutaneous stranding and skin thickening around the left jaw with patchy areas of rim enhancing collection measuring up to 11 mm. No underlying odontogenic source is seen. There is chronic submental adenopathy when correlated with 2020 study, possibly related to patient's history of HIV. A small low-density area is seen in this anterior aspect of this node, presumably from the acute inflammation, but no drainable nodal collection. Limited  intracranial: Negative IMPRESSION: 1. Cellulitic changes centered on the left jaw with scattered buccal and subcutaneous collections measuring up to 11 mm. No underlying odontogenic source is seen. No floor of mouth cellulitis or mass effect. 2. Chronic submental adenopathy. 3. Active, generalized sinusitis. Electronically Signed   By: Tiburcio Pea M.D.   On: 10/15/2023 05:20    ROS: 14 pt ROS negative except for above  Blood pressure 121/86, pulse 78, temperature 98.2 F (36.8 C), temperature source Oral, resp. rate 18, height 6\' 8"  (2.032 m), weight 79.4 kg, SpO2 100%.  PHYSICAL EXAM:  CONSTITUTIONAL: well developed, nourished, no distress and alert and oriented x 3 CARDIOVASCULAR: normal rate PULMONARY/CHEST WALL: effort normal and no stridor, no stertor, no dysphonia HENT: Head : normocephalic and atraumatic Nose: nose normal and no purulence Mouth/Throat:  Mouth: uvula midline and no oral lesions; dentition fair, no dental pain Throat: oropharynx clear and moist Mucous membranes: normal EYES: conjunctiva normal, EOM normal and PERRL NECK: trachea normal and no thyromegaly  Left facial/ lower cheek swelling which is firm and indurated but no obvious fluctuance. There is also another more distinct area of swelling over inferior parasymphyseal border. No significant overlying erythema or tenderness.  Facial sensation intact; FN function intact EXCEPT difficult to assess left lower lip due to swelling but does appear to move (can't comment of symmetry); there is a prior left submandibular incision - reported due to prior abscess FOM soft, tongue with good protrusion.  Studies Reviewed: CT Neck with contrast 10/15/2023 independently reviewed and agree with read - small hypodensity (<1cm) in multiple areas over left cheek and submental/submandibular area with phlegmon change but there is no significant collection amenable to drainage currently IMPRESSION: "Soft tissues: Subcutaneous  stranding and skin thickening around the left jaw with patchy areas of rim enhancing collection measuring up to 11 mm. No underlying odontogenic source is seen. There is chronic submental adenopathy when correlated with 2020 study, possibly related to patient's history of HIV. A  small low-density area is seen in this anterior aspect of this node, presumably from the acute inflammation, but no drainable nodal collection.   Limited intracranial: Negative   IMPRESSION: 1. Cellulitic changes centered on the left jaw with scattered buccal and subcutaneous collections measuring up to 11 mm. No underlying odontogenic source is seen. No floor of mouth cellulitis or mass effect. 2. Chronic submental adenopathy. 3. Active, generalized sinusitis."  MRSA, BMP, CBC, BCX (10/15/2023) reviewed: Bcx pending, WBC 7.7, no AKI HIV RNA Quant 07/10/2023: 263; T-helper 07/10/2023: 78 2020 facial abscess Cx: MSSA only resistant to erythromycin  Assessment/Plan: 28 yo with h/o HIV and immunocompromised status now with left facial cellulitis and early phlegmonous change. Reviewed CT personally and it appears there are small hypodensity submental and left cheek but these are quite small and I do not think amenable to any drainage given no significant peripheral enhancement and size. Given that and antibiotic naive status, would recommend antibiotic trial and treatment of HIV  - Agree with IV antibiotics - would likely do for 48 hours and see how patient responds; although prior Cx negative for MRSA, would still treat for MRSA currently given immunocompromised status - Ok for PO from ent standpoint - Will peripherally follow, please page with any questions - Remainder of care per primary team Please message with any questions  Read Drivers  I have personally spent 50 minutes involved in face-to-face and non-face-to-face activities for this patient on the day of encounter  Professional time spent excludes any  procedures performed but includes the following activities, in addition to those noted in the documentation: review of outside documentation and results, performing a medically appropriate examination, counseling, documenting in the electronic health record MDM: new problem, independent review of multiple labs, notes, and independent interpretation of imaging; managing prescriptions and medication recommendations in setting of risk factors (immunocompromised status)  10/15/2023, 9:30 AM

## 2023-10-15 NOTE — Assessment & Plan Note (Signed)
Cont Symtuza Cont Bactrim DS BID Check CD4 and HIV viral load (looks like ID wanted those this month anyhow per their Oct office note).

## 2023-10-15 NOTE — Hospital Course (Addendum)
[name] [name] is a 28 y.o. adult with medical history significant of HIV/AIDS, multiple skin abscesses presented to ED with swelling of left lower face. Patient has an history of multiple MSSA cutaneous abscesses. She was started on prophylactic doses of Bactrim but she was not taking it regularly.   She was found to have left lower jaw abscess with cellulitis, EDP discussed with ENT but they were suggesting no surgical intervention and discontinue antibiotics.  She was started on Unasyn and will continue Bactrim for now. MRSA PCR came back negative.  Blood cultures pending.  CBC with no leukocytosis, CMP with mild hypokalemia at 3.4 and nonspecific mild hyponatremia.  1/19: Remained hemodynamically stable.  ENT evaluated him, no need for any incision and drainage.  Detectable viral load as he was taking his HIV medications inconsistently.  They are recommending continue IV antibiotics for another day.

## 2023-10-15 NOTE — Progress Notes (Signed)
No charge progress note.   [name] [name] is a 28 y.o. adult with medical history significant of HIV/AIDS, multiple skin abscesses presented to ED with swelling of left lower face. Patient has an history of multiple MSSA cutaneous abscesses. She was started on prophylactic doses of Bactrim but she was not taking it regularly.   She was found to have left lower jaw abscess with cellulitis, EDP discussed with ENT but they were suggesting no surgical intervention and discontinue antibiotics.  She was started on Unasyn and will continue Bactrim for now. MRSA PCR came back negative.  Blood cultures pending.  CBC with no leukocytosis, CMP with mild hypokalemia at 3.4 and nonspecific mild hyponatremia.  1/19: Remained hemodynamically stable.  ENT evaluated him, no need for any incision and drainage.  Detectable viral load as he was taking his HIV medications inconsistently.  They are recommending continue IV antibiotics for another day.  On exam patient has edema of left lower jaw and submental region.  No fluctuation.  Rest of the exam was benign.  -We will continue current management and if he continued to improve then will be discharged tomorrow on p.o. antibiotics.

## 2023-10-15 NOTE — Progress Notes (Signed)
Patient left AMA.  Secure chatted Dr. Nelson Chimes who called the room to discuss plan of care.  The patient left 1 minute before she called.    Patient stated she is not being treated correctly and we are doing nothing to help her.  I explained that we are treating her according the the MD's plan and what the IV abx schedule was.  She was also upset that we advised her not to  order [employer] for her meals.  We explained that staff is busy and we do not have time to pick up her food.  The patient refused to wait for Dr. Shanda Bumps call.   Removed PIV and had patient sign AMA form.  Patient walked to from exit.

## 2023-10-15 NOTE — ED Notes (Signed)
MD at bedside attempting to discuss care with the pt. Pt immediately got indignant with MD and states "You can look in my chart, I have been here many times before." Pt waves off MD and continues to roll eyes while MD is performing assessment.

## 2023-10-15 NOTE — ED Triage Notes (Signed)
Pt has swelling on left lower jaw. Pt has tried warm compresses w/o success. Pt states it is similar to previous abscesses he has had.

## 2023-10-15 NOTE — Plan of Care (Signed)

## 2023-10-15 NOTE — Assessment & Plan Note (Addendum)
Cellulitis and multiple abscesses of lower jaw, largest 1.1cm Extensive h/o prior abscesses of multiple locations in past, but everything I see that gets cultured in chart and care everywhere always seems to be MSSA. EDP d/w ENT: Non surgical at this point, just use ABx ENT will see and leave a note to this effect Got cefepime / vanc in ED Will leave on Unasyn + bactrim for the moment Check MRSA PCR nares Re-order / continue vanc if positive

## 2023-10-15 NOTE — H&P (Signed)
History and Physical    Patient: [name] [name]:[id number] DOB: 1996/04/30 DOA: 10/15/2023 DOS: the patient was seen and examined on 10/15/2023 PCP: Vida Rigger, MD  Patient coming from: Home  Chief Complaint:  Chief Complaint  Patient presents with   Facial Swelling   HPI: [name] [name] is a 28 y.o. adult with medical history significant of HIV/AIDS, multiple skin abscesses.  Pt in to ED with c/o swelling of L lower face.  Extensive history of cutaneous abscesses in past.  Always seems to be MSSA when ever it gets cultured from what I can see in our labs and care everywhere.  Dont see any episodes where patient grew out MRSA specifically.  Pt admits to not being entirely adherent to taking her bactrim.  Generally does take her Symtuza though has missed a few doses she reports.   Review of Systems: As mentioned in the history of present illness. All other systems reviewed and are negative. Past Medical History:  Diagnosis Date   Acute kidney failure (HCC) 04/2017   Anal fissure 2017   C. 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  She has a 1.2 pack-year smoking history. She has never used smokeless tobacco. She reports current alcohol use of about 70.0 standard drinks of alcohol per week. She reports that she does not currently use drugs after having used the following drugs: Marijuana, Cocaine, and MDMA (Ecstacy). Frequency: 7.00 times per week.  No Known Allergies  Family History  Problem Relation Age of Onset   Hypertension Maternal Grandmother     Prior to Admission medications   Medication Sig Start Date End Date Taking? Authorizing Provider  SYMTUZA 800-150-200-10 MG TABS Take 1 tablet by mouth daily. 07/10/23  Yes Veryl Speak, FNP  sulfamethoxazole-trimethoprim (BACTRIM DS) 800-160 MG tablet Take 1 tablet by mouth 2 (two) times daily. 07/10/23   Veryl Speak, FNP    Physical Exam: Vitals:   10/15/23 0329 10/15/23 0332  BP:  (!) 149/98  Pulse:  74  Resp:  18  Temp:  98.6 F (37 C)  SpO2:  99%  Weight: 79.4 kg   Height: 6\' 8"  (2.032 m)    Constitutional: NAD, calm, comfortable Respiratory: clear to auscultation bilaterally, no wheezing, no crackles. Normal respiratory effort. No accessory muscle use.  Cardiovascular: Regular rate and rhythm, no murmurs / rubs / gallops. No extremity edema. 2+ pedal pulses. No carotid bruits.  Abdomen: no tenderness, no masses palpated. No hepatosplenomegaly. Bowel sounds positive.  Skin: Swelling to  L jaw Neurologic: CN 2-12 grossly intact. Sensation intact, DTR normal. Strength 5/5 in all 4.  Psychiatric: Normal judgment and insight. Alert and oriented x 3. Normal mood.   Data Reviewed:    Labs on Admission: I have personally reviewed following labs and imaging studies  CBC: Recent Labs  Lab 10/15/23 0339  WBC 7.7  NEUTROABS 4.4  HGB 12.5*  HCT 37.2*  MCV 96.1  PLT 259   Basic Metabolic Panel: Recent Labs  Lab 10/15/23 0339  NA 134*  K 3.4*  CL 100  CO2 24  GLUCOSE 103*  BUN 12  CREATININE 1.22  CALCIUM 9.3   GFR: Estimated  Creatinine Clearance (by C-G formula based on SCr of 1.22 mg/dL) Male: 16.1 mL/min Male: 100.3 mL/min Liver Function Tests: Recent Labs  Lab 10/15/23 0339  AST 19  ALT 13  ALKPHOS 74  BILITOT 0.6  PROT 9.1*  ALBUMIN 3.6   No results for input(s): "LIPASE", "AMYLASE" in the last 168 hours. No results for input(s): "AMMONIA" in the last 168 hours. Coagulation Profile: No results for input(s): "INR", "PROTIME" in the last 168 hours. Cardiac Enzymes: No results for input(s): "CKTOTAL", "CKMB", "CKMBINDEX", "TROPONINI" in the last 168 hours. BNP (last 3 results) No results for input(s): "PROBNP" in the last 8760 hours. HbA1C: No results for input(s): "HGBA1C" in the last 72 hours. CBG: No results for input(s): "GLUCAP" in the last 168 hours. Lipid Profile: No results for input(s): "CHOL", "HDL", "LDLCALC", "TRIG", "CHOLHDL", "LDLDIRECT" in the last 72 hours. Thyroid Function Tests: No results for input(s): "TSH", "T4TOTAL", "FREET4", "T3FREE", "THYROIDAB" in the last 72 hours. Anemia Panel: No results for input(s): "VITAMINB12", "FOLATE", "FERRITIN", "TIBC", "IRON", "RETICCTPCT" in the last 72 hours. Urine analysis:    Component Value Date/Time   COLORURINE YELLOW 05/08/2023 0041   APPEARANCEUR CLEAR 05/08/2023 0041   LABSPEC 1.017 05/08/2023 0041   PHURINE 6.0 05/08/2023 0041   GLUCOSEU NEGATIVE 05/08/2023 0041   HGBUR NEGATIVE 05/08/2023 0041   BILIRUBINUR NEGATIVE 05/08/2023 0041   KETONESUR NEGATIVE 05/08/2023 0041   PROTEINUR NEGATIVE 05/08/2023 0041   NITRITE NEGATIVE 05/08/2023 0041   LEUKOCYTESUR TRACE (A) 05/08/2023 0041    Radiological Exams on Admission: CT Maxillofacial W Contrast Result Date: 10/15/2023 CLINICAL DATA:  Swelling on the left lower jaw EXAM: CT MAXILLOFACIAL WITH CONTRAST TECHNIQUE: Multidetector CT imaging of the maxillofacial structures was performed with intravenous contrast. Multiplanar CT image reconstructions were also generated.  RADIATION DOSE REDUCTION: This exam was performed according to the departmental dose-optimization program which includes automated exposure control, adjustment of the mA and/or kV according to patient size and/or use of iterative reconstruction technique. CONTRAST:  50mL OMNIPAQUE IOHEXOL 350 MG/ML SOLN COMPARISON:  02/09/2019 FINDINGS: Osseous: No fracture or destructive lesion. No odontogenic source seen underlying the soft tissue inflammation. Orbits: Negative. No traumatic or inflammatory finding. Sinuses: Generalized opacification of the paranasal sinuses, mucosal thickening and fluid greatest at the left maxilla. There is atelectasis, sclerotic wall thickening, and dystrophic calcification at the right maxillary sinus. Soft tissues: Subcutaneous stranding and skin thickening around the left jaw with patchy areas of rim enhancing collection measuring up to 11 mm. No underlying odontogenic source is seen. There is chronic submental adenopathy when correlated with 2020 study, possibly related to patient's history of HIV. A small low-density area is seen in this anterior aspect of this node, presumably from the acute inflammation, but no drainable nodal collection. Limited intracranial: Negative IMPRESSION: 1. Cellulitic changes centered on the left jaw with  scattered buccal and subcutaneous collections measuring up to 11 mm. No underlying odontogenic source is seen. No floor of mouth cellulitis or mass effect. 2. Chronic submental adenopathy. 3. Active, generalized sinusitis. Electronically Signed   By: Tiburcio Pea M.D.   On: 10/15/2023 05:20    EKG: Independently reviewed.   Assessment and Plan: * Abscess of face Cellulitis and multiple abscesses of lower jaw, largest 1.1cm Extensive h/o prior abscesses of multiple locations in past, but everything I see that gets cultured in chart and care everywhere always seems to be MSSA. EDP d/w ENT: Non surgical at this point, just use ABx ENT will see and  leave a note to this effect Got cefepime / vanc in ED Will leave on Unasyn + bactrim for the moment Check MRSA PCR nares Re-order / continue vanc if positive  AIDS (acquired immune deficiency syndrome) (HCC) Cont Symtuza Cont Bactrim DS BID Check CD4 and HIV viral load (looks like ID wanted those this month anyhow per their Oct office note).      Advance Care Planning:   Code Status: Full Code  Consults: EDP d/w ENT who will see this AM and leave a note.  Family Communication: No family in room  Severity of Illness: The appropriate patient status for this patient is OBSERVATION. Observation status is judged to be reasonable and necessary in order to provide the required intensity of service to ensure the patient's safety. The patient's presenting symptoms, physical exam findings, and initial radiographic and laboratory data in the context of their medical condition is felt to place them at decreased risk for further clinical deterioration. Furthermore, it is anticipated that the patient will be medically stable for discharge from the hospital within 2 midnights of admission.   Author: Hillary Bow., DO 10/15/2023 6:00 AM  For on call review [email or web address].

## 2023-10-15 NOTE — ED Provider Notes (Signed)
[place] EMERGENCY DEPARTMENT AT [facility] Provider Note   CSN: [id number] Arrival date & time: 10/15/23  3086     History  Chief Complaint  Patient presents with   Facial Swelling    [name] [name] is a 28 y.o. adult.  The history is provided by the patient.  Illness Location:  Left lower face onto neck Quality:  Swelling and painful Severity:  Moderate Onset quality:  Gradual Timing:  Constant Progression:  Worsening Chronicity:  Recurrent Context:  Patient with AIDS with a history of facial cellulitis and abscess Relieved by:  Nothing Worsened by:  Nothing Ineffective treatments:  None Associated symptoms: no ear pain, no fever and no rash        Home Medications Prior to Admission medications   Medication Sig Start Date End Date Taking? Authorizing Provider  SYMTUZA 800-150-200-10 MG TABS Take 1 tablet by mouth daily. 07/10/23  Yes Veryl Speak, FNP  sulfamethoxazole-trimethoprim (BACTRIM DS) 800-160 MG tablet Take 1 tablet by mouth 2 (two) times daily. 07/10/23   Veryl Speak, FNP      Allergies    Patient has no known allergies.    Review of Systems   Review of Systems  Constitutional:  Negative for fever.  HENT:  Positive for facial swelling. Negative for ear discharge and ear pain.   Eyes:  Negative for redness.  Skin:  Negative for rash.  All other systems reviewed and are negative.   Physical Exam Updated Vital Signs BP (!) 149/98   Pulse 74   Temp 98.6 F (37 C)   Resp 18   Ht 6\' 8"  (2.032 m)   Wt 79.4 kg   SpO2 99%   BMI 19.22 kg/m  Physical Exam Vitals and nursing note reviewed. Exam conducted with a chaperone present.  Constitutional:      General: She is not in acute distress.    Appearance: She is well-developed. She is not diaphoretic.  HENT:     Head: Normocephalic.      Nose: Nose normal.  Eyes:     Conjunctiva/sclera: Conjunctivae normal.     Pupils: Pupils are equal, round, and reactive to light.   Cardiovascular:     Rate and Rhythm: Normal rate and regular rhythm.     Pulses: Normal pulses.     Heart sounds: Normal heart sounds.  Pulmonary:     Effort: Pulmonary effort is normal.     Breath sounds: Normal breath sounds. No wheezing or rales.  Abdominal:     General: Bowel sounds are normal.     Palpations: Abdomen is soft.     Tenderness: There is no abdominal tenderness. There is no guarding or rebound.  Musculoskeletal:        General: Normal range of motion.     Cervical back: Normal range of motion and neck supple.  Skin:    General: Skin is warm and dry.     Capillary Refill: Capillary refill takes less than 2 seconds.  Neurological:     General: No focal deficit present.     Mental Status: She is alert and oriented to person, place, and time.     Deep Tendon Reflexes: Reflexes normal.     ED Results / Procedures / Treatments   Labs (all labs ordered are listed, but only abnormal results are displayed) Results for orders placed or performed during the hospital encounter of 10/15/23  Comprehensive metabolic panel   Collection Time: 10/15/23  3:39 AM  Result Value Ref Range   Sodium 134 (L) 135 - 145 mmol/L   Potassium 3.4 (L) 3.5 - 5.1 mmol/L   Chloride 100 98 - 111 mmol/L   CO2 24 22 - 32 mmol/L   Glucose, Bld 103 (H) 70 - 99 mg/dL   BUN 12 6 - 20 mg/dL   Creatinine, Ser 4.09 0.61 - 1.24 mg/dL   Calcium 9.3 8.9 - 81.1 mg/dL   Total Protein 9.1 (H) 6.5 - 8.1 g/dL   Albumin 3.6 3.5 - 5.0 g/dL   AST 19 15 - 41 U/L   ALT 13 0 - 44 U/L   Alkaline Phosphatase 74 38 - 126 U/L   Total Bilirubin 0.6 0.0 - 1.2 mg/dL   GFR, Estimated >91 >47 mL/min   Anion gap 10 5 - 15  CBC with Differential   Collection Time: 10/15/23  3:39 AM  Result Value Ref Range   WBC 7.7 4.0 - 10.5 K/uL   RBC 3.87 (L) 4.22 - 5.81 MIL/uL   Hemoglobin 12.5 (L) 13.0 - 17.0 g/dL   HCT 82.9 (L) 56.2 - 13.0 %   MCV 96.1 80.0 - 100.0 fL   MCH 32.3 26.0 - 34.0 pg   MCHC 33.6 30.0 - 36.0  g/dL   RDW 86.5 78.4 - 69.6 %   Platelets 259 150 - 400 K/uL   nRBC 0.0 0.0 - 0.2 %   Neutrophils Relative % 57 %   Neutro Abs 4.4 1.7 - 7.7 K/uL   Lymphocytes Relative 23 %   Lymphs Abs 1.7 0.7 - 4.0 K/uL   Monocytes Relative 6 %   Monocytes Absolute 0.4 0.1 - 1.0 K/uL   Eosinophils Relative 14 %   Eosinophils Absolute 1.1 (H) 0.0 - 0.5 K/uL   Basophils Relative 0 %   Basophils Absolute 0.0 0.0 - 0.1 K/uL   Immature Granulocytes 0 %   Abs Immature Granulocytes 0.02 0.00 - 0.07 K/uL  I-Stat Lactic Acid, ED   Collection Time: 10/15/23  3:43 AM  Result Value Ref Range   Lactic Acid, Venous 0.6 0.5 - 1.9 mmol/L  Blood culture (routine x 2)   Collection Time: 10/15/23  4:10 AM   Specimen: BLOOD RIGHT FOREARM  Result Value Ref Range   Specimen Description BLOOD RIGHT FOREARM    Special Requests      BOTTLES DRAWN AEROBIC AND ANAEROBIC Blood Culture results may not be optimal due to an inadequate volume of blood received in culture bottles Performed at [facility], [address], [place], [place] [address]    Culture PENDING    Report Status PENDING   Blood culture (routine x 2)   Collection Time: 10/15/23  4:38 AM   Specimen: BLOOD RIGHT FOREARM  Result Value Ref Range   Specimen Description BLOOD RIGHT FOREARM    Special Requests      BOTTLES DRAWN AEROBIC AND ANAEROBIC Blood Culture results may not be optimal due to an inadequate volume of blood received in culture bottles Performed at [facility], [address], [place], [place] [address]    Culture PENDING    Report Status PENDING   I-Stat Lactic Acid, ED   Collection Time: 10/15/23  5:47 AM  Result Value Ref Range   Lactic Acid, Venous 0.5 0.5 - 1.9 mmol/L   CT Maxillofacial W Contrast Result Date: 10/15/2023 CLINICAL DATA:  Swelling on the left lower jaw EXAM: CT MAXILLOFACIAL WITH CONTRAST TECHNIQUE: Multidetector CT imaging of the maxillofacial  structures was performed with intravenous contrast.  Multiplanar CT image reconstructions were also generated. RADIATION DOSE REDUCTION: This exam was performed according to the departmental dose-optimization program which includes automated exposure control, adjustment of the mA and/or kV according to patient size and/or use of iterative reconstruction technique. CONTRAST:  50mL OMNIPAQUE IOHEXOL 350 MG/ML SOLN COMPARISON:  02/09/2019 FINDINGS: Osseous: No fracture or destructive lesion. No odontogenic source seen underlying the soft tissue inflammation. Orbits: Negative. No traumatic or inflammatory finding. Sinuses: Generalized opacification of the paranasal sinuses, mucosal thickening and fluid greatest at the left maxilla. There is atelectasis, sclerotic wall thickening, and dystrophic calcification at the right maxillary sinus. Soft tissues: Subcutaneous stranding and skin thickening around the left jaw with patchy areas of rim enhancing collection measuring up to 11 mm. No underlying odontogenic source is seen. There is chronic submental adenopathy when correlated with 2020 study, possibly related to patient's history of HIV. A small low-density area is seen in this anterior aspect of this node, presumably from the acute inflammation, but no drainable nodal collection. Limited intracranial: Negative IMPRESSION: 1. Cellulitic changes centered on the left jaw with scattered buccal and subcutaneous collections measuring up to 11 mm. No underlying odontogenic source is seen. No floor of mouth cellulitis or mass effect. 2. Chronic submental adenopathy. 3. Active, generalized sinusitis. Electronically Signed   By: Tiburcio Pea M.D.   On: 10/15/2023 05:20     Radiology CT Maxillofacial W Contrast Result Date: 10/15/2023 CLINICAL DATA:  Swelling on the left lower jaw EXAM: CT MAXILLOFACIAL WITH CONTRAST TECHNIQUE: Multidetector CT imaging of the maxillofacial structures was performed with intravenous contrast. Multiplanar CT image reconstructions were also  generated. RADIATION DOSE REDUCTION: This exam was performed according to the departmental dose-optimization program which includes automated exposure control, adjustment of the mA and/or kV according to patient size and/or use of iterative reconstruction technique. CONTRAST:  50mL OMNIPAQUE IOHEXOL 350 MG/ML SOLN COMPARISON:  02/09/2019 FINDINGS: Osseous: No fracture or destructive lesion. No odontogenic source seen underlying the soft tissue inflammation. Orbits: Negative. No traumatic or inflammatory finding. Sinuses: Generalized opacification of the paranasal sinuses, mucosal thickening and fluid greatest at the left maxilla. There is atelectasis, sclerotic wall thickening, and dystrophic calcification at the right maxillary sinus. Soft tissues: Subcutaneous stranding and skin thickening around the left jaw with patchy areas of rim enhancing collection measuring up to 11 mm. No underlying odontogenic source is seen. There is chronic submental adenopathy when correlated with 2020 study, possibly related to patient's history of HIV. A small low-density area is seen in this anterior aspect of this node, presumably from the acute inflammation, but no drainable nodal collection. Limited intracranial: Negative IMPRESSION: 1. Cellulitic changes centered on the left jaw with scattered buccal and subcutaneous collections measuring up to 11 mm. No underlying odontogenic source is seen. No floor of mouth cellulitis or mass effect. 2. Chronic submental adenopathy. 3. Active, generalized sinusitis. Electronically Signed   By: Tiburcio Pea M.D.   On: 10/15/2023 05:20    Procedures Procedures    Medications Ordered in ED Medications  ceFEPIme (MAXIPIME) 2 g in sodium chloride 0.9 % 100 mL IVPB (2 g Intravenous New Bag/Given 10/15/23 0527)  vancomycin (VANCOREADY) IVPB 1750 mg/350 mL (has no administration in time range)  ketorolac (TORADOL) 30 MG/ML injection 30 mg (has no administration in time range)   Darunavir-Cobicistat-Emtricitabine-Tenofovir Alafenamide (SYMTUZA) 800-150-200-10 MG TABS 1 tablet (has no administration in time range)  sulfamethoxazole-trimethoprim (BACTRIM DS) 800-160 MG per tablet 1 tablet (has  no administration in time range)  enoxaparin (LOVENOX) injection 40 mg (has no administration in time range)  acetaminophen (TYLENOL) tablet 650 mg (has no administration in time range)    Or  acetaminophen (TYLENOL) suppository 650 mg (has no administration in time range)  ondansetron (ZOFRAN) tablet 4 mg (has no administration in time range)    Or  ondansetron (ZOFRAN) injection 4 mg (has no administration in time range)  iohexol (OMNIPAQUE) 350 MG/ML injection 50 mL (50 mLs Intravenous Contrast Given 10/15/23 0448)  dexamethasone (DECADRON) injection 10 mg (10 mg Intravenous Given 10/15/23 0522)    ED Course/ Medical Decision Making/ A&P                                 Medical Decision Making Facial cellulitis   Amount and/or Complexity of Data Reviewed External Data Reviewed: notes.    Details: Previous admission at OSH Labs: ordered.    Details: Sodium 134, potassium 3.4, normal creatinine.  Normal LFTs. Normal white count 7.7, low hemoglobin 12.5, normal platelets, blood cultures sent, normal lactate   Radiology: ordered and independent interpretation performed.    Details: Facial cellulitis by me on CT Discussion of management or test interpretation with external provider(s): Case d/w Dr. Daralene Milch, not an abscess will see in consult   Risk Prescription drug management. Decision regarding hospitalization.    Final Clinical Impression(s) / ED Diagnoses Final diagnoses:  Facial cellulitis   The patient appears reasonably stabilized for admission considering the current resources, flow, and capabilities available in the ED at this time, and I doubt any other [facility] requiring further screening and/or treatment in the ED prior to admission.  Rx / DC Orders ED  Discharge Orders     None         Unknown Schleyer, MD 10/15/23 0272

## 2023-10-16 LAB — T-HELPER CELLS (CD4) COUNT (NOT AT ARMC)
CD4 % Helper T Cell: 4 % — ABNORMAL LOW (ref 33–65)
CD4 T Cell Abs: <35 /uL — ABNORMAL LOW (ref 400–1790)

## 2023-10-16 LAB — HIV-1 RNA QUANT-NO REFLEX-BLD
HIV 1 RNA Quant: 390 {copies}/mL
LOG10 HIV-1 RNA: 2.591 {Log}

## 2023-10-20 ENCOUNTER — Other Ambulatory Visit: Payer: Self-pay

## 2023-10-20 ENCOUNTER — Encounter: Payer: Self-pay | Admitting: Family

## 2023-10-20 ENCOUNTER — Ambulatory Visit (INDEPENDENT_AMBULATORY_CARE_PROVIDER_SITE_OTHER): Payer: Medicaid Other | Admitting: Family

## 2023-10-20 VITALS — BP 120/72 | HR 82 | Temp 97.7°F | Wt 169.0 lb

## 2023-10-20 DIAGNOSIS — Z0000 Encounter for general adult medical examination without abnormal findings: Secondary | ICD-10-CM

## 2023-10-20 DIAGNOSIS — A528 Late syphilis, latent: Secondary | ICD-10-CM

## 2023-10-20 DIAGNOSIS — L03211 Cellulitis of face: Secondary | ICD-10-CM

## 2023-10-20 DIAGNOSIS — B20 Human immunodeficiency virus [HIV] disease: Secondary | ICD-10-CM

## 2023-10-20 LAB — CULTURE, BLOOD (ROUTINE X 2)
Culture: NO GROWTH
Culture: NO GROWTH

## 2023-10-20 MED ORDER — SULFAMETHOXAZOLE-TRIMETHOPRIM 800-160 MG PO TABS: 1.0000 | ORAL_TABLET | Freq: Every day | ORAL | 3 refills | Status: DC

## 2023-10-20 MED ORDER — SYMTUZA 800-150-200-10 MG PO TABS: 1.0000 | ORAL_TABLET | Freq: Every day | ORAL | 5 refills | Status: DC

## 2023-10-20 MED ORDER — AMOXICILLIN-POT CLAVULANATE 875-125 MG PO TABS
1.0000 | ORAL_TABLET | Freq: Two times a day (BID) | ORAL | 0 refills | Status: DC
  Filled 2023-10-20: qty 42, 21d supply, fill #0

## 2023-10-20 NOTE — Assessment & Plan Note (Signed)
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 Reviewed previous lab work and discussed plan of care and goal of obtaining U equals U.  Emphasized importance of continuing to take medication daily to given her immune system a chance at recovery as she remains at significantly elevated risk for opportunistic infection. With ART I do not think she needs azithromycin at this time and will monitor closely. Check lab work. Continue current dose of Symtuza and Bactrim. Continue to work with bridge counseling and follow up in 3 months or sooner if needed with lab work on the same day.

## 2023-10-20 NOTE — Assessment & Plan Note (Signed)
Previous RPR titer waxing and waning between 1:1 and non-reactive. 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 Will continue to monitor RPR.

## 2023-10-20 NOTE — Progress Notes (Signed)
Brief Narrative   Patient ID: [name] [name], adult    DOB: [birth year], 28 y.o.   MRN: [id number]  [name] [name] is a 28 y/o transgender male diagnosed with HIV disease in March 2013 with risk factor of MSM. Initial viral load was 89,700 with CD4 count of 460. No history of opportunistic infection. Genosure with M184V (emtricitabine, lamivudine) and R263K. [name] [name] negative. ART experienced with Complera, Prezcobix, Descovy, Biktarvy and now Symtuza.   Subjective:    Chief Complaint  Patient presents with   Follow-up    Interested in counseling//     HPI:  [name] [name] is a 28 y.o. adult with HIV disease last seen on 07/10/2023 with improved viral control and adherence to Symtuza supplemented with Bactrim for OI prophylaxis.  Low level viremia with viral load of 390 and CD4 count less than 35.  Remains at high risk for opportunistic infection.  In the interim was admitted to the hospital on 10/15/2023 for facial cellulitis/abscess.  CT imaging of maxillofacial shows cellulitic changes centered on the left jaw with scattered buccal and subcutaneous collections measuring up to 11 mm with no underlying odontogenic source.  He was placed Unasyn and after 24 hours left AGAINST MEDICAL ADVICE.  Here today for follow-up.  [name] [name] has been doing okay since her last office visit and continues to have a lesion/cellulitis of her left lower jaw which she has had previously and required surgical intervention.  Has previously taken clindamycin which has helped resolve the issue.  No fevers/chills.  Continues to take Symtuza with occasional missed dose and less than optimal adherence to her Bactrim.  Condoms and site-specific STD testing offered.  Healthcare maintenance reviewed.  She continues to work with bridge counseling through [facility]   Denies fevers, chills, night sweats, headaches, changes in vision, neck pain/stiffness, nausea, diarrhea, vomiting, lesions or rashes.  Lab Results  Component Value Date    CD4TCELL 4 (L) 10/15/2023   CD4TABS <35 (L) 10/15/2023   Lab Results  Component Value Date   HIV1RNAQUANT 390 10/15/2023     No Known Allergies    Outpatient Medications Prior to Visit  Medication Sig Dispense Refill   SYMTUZA 800-150-200-10 MG TABS Take 1 tablet by mouth daily. 30 tablet 5   sulfamethoxazole-trimethoprim (BACTRIM DS) 800-160 MG tablet Take 1 tablet by mouth 2 (two) times daily. (Patient not taking: Reported on 10/15/2023) 30 tablet 5   No facility-administered medications prior to visit.     Past Medical History:  Diagnosis Date   Acute kidney failure (HCC) 04/2017   Anal fissure 2017   C. 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 anus (lgsil) 2013   Rectal gonorrhea    multiple episodes   Seizures (HCC) 2017   "probably stress related" (05/24/2017)   Syphilis    Syphilis, secondary 11/2011   tx'ed with IM PCN x 3     Past Surgical History:  Procedure Laterality Date   ANAL EXAMINATION UNDER ANESTHESIA     INCISION AND DRAINAGE ABSCESS Left 02/16/2019   Procedure: INCISION AND DRAINAGE ABSCESS;  Surgeon: Christia Reading, MD;  Location: [facility] OR;  Service: ENT;  Laterality: Left;   WISDOM TOOTH EXTRACTION        Review of Systems  Constitutional:  Negative for appetite change, chills, diaphoresis, fatigue, fever and unexpected weight change.  HENT:  Positive for facial swelling.   Eyes:        Negative for acute change in vision  Respiratory:  Negative for chest tightness, shortness of breath and wheezing.   Cardiovascular:  Negative for chest pain.  Gastrointestinal:  Negative for diarrhea, nausea and vomiting.  Genitourinary:  Negative for  dysuria, pelvic pain and vaginal discharge.  Musculoskeletal:  Negative for neck pain and neck stiffness.  Skin:  Negative for rash.  Neurological:  Negative for seizures, syncope, weakness and headaches.  Hematological:  Negative for adenopathy. Does not bruise/bleed easily.  Psychiatric/Behavioral:  Negative for hallucinations.       Objective:    BP 120/72   Pulse 82   Temp 97.7 F (36.5 C) (Oral)   Wt 169 lb (76.7 kg)   SpO2 100%   BMI 18.57 kg/m  Nursing note and vital signs reviewed.  Physical Exam Constitutional:      General: She is not in acute distress.    Appearance: She is well-developed.  HENT:     Mouth/Throat:     Comments: Facial swelling of left lower jaw  Cardiovascular:     Rate and Rhythm: Normal rate and regular rhythm.     Heart sounds: Normal heart sounds.  Pulmonary:     Effort: Pulmonary effort is normal.     Breath sounds: Normal breath sounds.  Skin:    General: Skin is warm and dry.  Neurological:     Mental Status: She is alert and oriented to person, place, and time.         10/20/2023   11:19 AM 07/10/2023   10:54 AM 05/11/2023   10:33 AM 03/10/2023   10:37 AM 02/07/2023    9:01 AM  Depression screen PHQ 2/9  Decreased Interest 1 0 2 0 0  Down, Depressed, Hopeless 1 0 2 1 1   PHQ - 2 Score 2 0 4 1 1   Altered sleeping 3  3    Tired, decreased energy 2  2    Change in appetite 0  3    Feeling bad or failure about yourself  1  2    Trouble concentrating 0  3    Moving slowly or fidgety/restless 0  1    Suicidal thoughts 0  3    PHQ-9 Score 8  21    Difficult doing work/chores   Somewhat difficult         Assessment & Plan:    Patient Active Problem List   Diagnosis Date Noted   Abscess of face 10/15/2023   Facial cellulitis 10/15/2023   Polysubstance use disorder 03/10/2023   Male-to-male transgender person 02/07/2023   Poor social situation 02/07/2023   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   Healthcare maintenance  06/02/2020   Axillary abscess 02/13/2020   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, 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   Enteropathogenic Escherichia coli infection 05/26/2017   Acute renal failure (ARF) (HCC) 05/24/2017   Abnormal brain MRI 02/07/2017   Rash 11/15/2016   Hypotension 11/05/2016   Shingles 09/14/2016   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 Reviewed previous lab work and discussed plan of care and goal of obtaining U equals U.  Emphasized importance of continuing to take medication daily to given her immune system a chance at recovery as she remains at significantly elevated risk for opportunistic infection. With ART I do not think she needs azithromycin at this time and will monitor closely. Check lab work. Continue current dose of Symtuza and Bactrim. Continue to work with bridge counseling and follow up in 3 months or sooner if needed with lab work on the same day.       Relevant Medications   sulfamethoxazole-trimethoprim (BACTRIM DS) 800-160 MG tablet   SYMTUZA 800-150-200-10 MG TABS   Other Relevant Orders   COMPLETE METABOLIC PANEL WITH GFR   HIV-1 RNA quant-no reflex-bld   T-helper cells (CD4) count (not at [facility])   Late latent syphilis   Previous RPR titer waxing and waning between 1:1 and non-reactive. 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 Will continue to monitor RPR.       Relevant Medications   sulfamethoxazole-trimethoprim (BACTRIM DS) 800-160 MG tablet   SYMTUZA 800-150-200-10 MG TABS   Healthcare maintenance   Discussed importance of safe sexual practice and condom use.  Condoms and site specific STD testing offered.  Vaccinations reviewed and deferred.  Routine dental care up to date      Facial cellulitis   [name] [name] continues to have untreated facial cellulitis. Has history of MSSA and will start Augmentin with MRSA covered by Bactrim. May ultimately need surgical intervention if symptoms worsen or do not improve with antimicrobial therapy. Advised to seek further care if no improvement or worsening.         I have changed [name] [name] "[name] [name]"'s sulfamethoxazole-trimethoprim. I am also having her start on amoxicillin-clavulanate. Additionally, I am having her maintain her Symtuza.   Meds ordered this encounter  Medications   amoxicillin-clavulanate (AUGMENTIN) 875-125 MG tablet    Sig: Take 1 tablet by mouth 2 (two) times daily.    Dispense:  42 tablet    Refill:  0    Supervising Provider:   Drue Second, CYNTHIA [[id number]]   sulfamethoxazole-trimethoprim (BACTRIM DS) 800-160 MG tablet    Sig: Take 1 tablet by mouth daily.    Dispense:  30 tablet    Refill:  3    Supervising Provider:   Drue Second, CYNTHIA [[id number]]   SYMTUZA 800-150-200-10 MG TABS    Sig: Take 1 tablet by mouth daily.    Dispense:  30 tablet    Refill:  5    Supervising Provider:   Judyann Munson [phone]    Prescription Type::   Renewal     Follow-up: Return in about 3 months (around 01/18/2024), or if symptoms worsen or fail to improve. or sooner if needed.    Marcos Eke, MSN, FNP-C Nurse Practitioner [facility] Main number: [phone]

## 2023-10-20 NOTE — Patient Instructions (Signed)
Nice to see you.  Continue to take your medication daily as prescribed.  Refills have been sent to the pharmacy.  Plan for follow up in 3 months or sooner if needed with lab work on the same day.  Have a great day and stay safe!

## 2023-10-20 NOTE — Assessment & Plan Note (Signed)
Discussed importance of safe sexual practice and condom use. Condoms and site specific STD testing offered.  Vaccinations reviewed and deferred.  Routine dental care up to date

## 2023-10-20 NOTE — Assessment & Plan Note (Signed)
[name] [name] continues to have untreated facial cellulitis. Has history of MSSA and will start Augmentin with MRSA covered by Bactrim. May ultimately need surgical intervention if symptoms worsen or do not improve with antimicrobial therapy. Advised to seek further care if no improvement or worsening.

## 2023-10-24 LAB — HIV-1 RNA QUANT-NO REFLEX-BLD
HIV 1 RNA Quant: 334 {copies}/mL — ABNORMAL HIGH
HIV-1 RNA Quant, Log: 2.52 {Log} — ABNORMAL HIGH

## 2023-10-24 LAB — T-HELPER CELLS (CD4) COUNT (NOT AT ARMC)
Absolute CD4: 131 {cells}/uL — ABNORMAL LOW (ref 490–1740)
CD4 T Helper %: 8 % — ABNORMAL LOW (ref 30–61)
Total lymphocyte count: 1707 {cells}/uL (ref 850–3900)

## 2023-10-24 LAB — COMPLETE METABOLIC PANEL WITH GFR
AG Ratio: 0.9 (calc) — ABNORMAL LOW (ref 1.0–2.5)
ALT: 8 U/L — ABNORMAL LOW (ref 9–46)
AST: 11 U/L (ref 10–40)
Albumin: 3.8 g/dL (ref 3.6–5.1)
Alkaline phosphatase (APISO): 76 U/L (ref 36–130)
BUN: 17 mg/dL (ref 7–25)
CO2: 26 mmol/L (ref 20–32)
Calcium: 8.9 mg/dL (ref 8.6–10.3)
Chloride: 103 mmol/L (ref 98–110)
Creat: 1.07 mg/dL (ref 0.60–1.24)
Globulin: 4.4 g/dL — ABNORMAL HIGH (ref 1.9–3.7)
Glucose, Bld: 85 mg/dL (ref 65–99)
Potassium: 3.8 mmol/L (ref 3.5–5.3)
Sodium: 136 mmol/L (ref 135–146)
Total Bilirubin: 0.2 mg/dL (ref 0.2–1.2)
Total Protein: 8.2 g/dL — ABNORMAL HIGH (ref 6.1–8.1)
eGFR: 96 mL/min/{1.73_m2} (ref >=60)

## 2023-10-28 DIAGNOSIS — K122 Cellulitis and abscess of mouth: Secondary | ICD-10-CM | POA: Insufficient documentation

## 2023-10-29 ENCOUNTER — Other Ambulatory Visit: Payer: Self-pay

## 2023-10-29 ENCOUNTER — Emergency Department (HOSPITAL_COMMUNITY)
Admission: EM | Admit: 2023-10-29 | Discharge: 2023-10-29 | Disposition: A | Discharge status: Home / Self Care | Payer: Medicare Other | Attending: Emergency Medicine | Admitting: Emergency Medicine

## 2023-10-29 ENCOUNTER — Encounter (HOSPITAL_COMMUNITY): Payer: Self-pay | Admitting: *Deleted

## 2023-10-29 DIAGNOSIS — L0291 Cutaneous abscess, unspecified: Secondary | ICD-10-CM

## 2023-10-29 MED ORDER — LIDOCAINE-EPINEPHRINE (PF) 2 %-1:200000 IJ SOLN
10.0000 mL | Freq: Once | INTRAMUSCULAR | Status: AC
  Administered 2023-10-29: 10 mL via INTRADERMAL
  Filled 2023-10-29: qty 20

## 2023-10-29 MED ORDER — HYDROCODONE-ACETAMINOPHEN 5-325 MG PO TABS: 1.0000 | ORAL_TABLET | Freq: Four times a day (QID) | ORAL | 0 refills | Status: DC | PRN

## 2023-10-29 MED ORDER — HYDROCODONE-ACETAMINOPHEN 5-325 MG PO TABS
1.0000 | ORAL_TABLET | Freq: Once | ORAL | Status: AC
Start: 2023-10-29 — End: 2023-10-29
  Administered 2023-10-29: 1 via ORAL
  Filled 2023-10-29: qty 1

## 2023-10-29 NOTE — Discharge Instructions (Signed)
Continue taking Augmentin antibiotic as prescribed. Take Norco for pain as needed. Ibuprofen will help also.   See your doctor for recheck in 3 days, sooner if symptoms worsen.

## 2023-10-29 NOTE — ED Triage Notes (Signed)
Pt has abscess to left side of mouth for a couple of days, golf-ball size abscess noted to L side of mouth

## 2023-10-29 NOTE — ED Provider Notes (Signed)
[facility] EMERGENCY DEPARTMENT AT [facility] Provider Note   CSN: [id number] Arrival date & time: 10/28/23  2228     History  Chief Complaint  Patient presents with   Abscess    [name] [name] is a 28 y.o. adult.  Patient to ED with facial abscess that started a few days ago and now extends to below jaw. No fever. No dental issues. History of same in the past requiring I&D. He reports he was started on Augmentin 3 days ago.   The history is provided by the patient. No language interpreter was used.  Abscess      Home Medications Prior to Admission medications   Medication Sig Start Date End Date Taking? Authorizing Provider  HYDROcodone-acetaminophen (NORCO) 5-325 MG tablet Take 1 tablet by mouth every 6 (six) hours as needed for moderate pain (pain score 4-6). 10/29/23  Yes Elpidio Anis, PA-C  amoxicillin-clavulanate (AUGMENTIN) 875-125 MG tablet Take 1 tablet by mouth 2 (two) times daily. 10/20/23   Veryl Speak, FNP  sulfamethoxazole-trimethoprim (BACTRIM DS) 800-160 MG tablet Take 1 tablet by mouth daily. 10/20/23   Veryl Speak, FNP  SYMTUZA 800-150-200-10 MG TABS Take 1 tablet by mouth daily. 10/20/23   Veryl Speak, FNP      Allergies    Patient has no known allergies.    Review of Systems   Review of Systems  Physical Exam Updated Vital Signs BP 122/76   Pulse 85   Temp 98.2 F (36.8 C) (Oral)   Resp 18   SpO2 100%  Physical Exam Vitals and nursing note reviewed.  Constitutional:      General: She is not in acute distress.    Appearance: She is well-developed. She is not ill-appearing.  HENT:     Head:     Comments: Large swollen area to left anterior jaw. Tender. Fluctuant. Smaller area noted in submental area.  Pulmonary:     Effort: Pulmonary effort is normal.  Musculoskeletal:        General: Normal range of motion.     Cervical back: Normal range of motion.  Skin:    General: Skin is warm and dry.  Neurological:      Mental Status: She is alert and oriented to person, place, and time.     ED Results / Procedures / Treatments   Labs (all labs ordered are listed, but only abnormal results are displayed) Labs Reviewed - No data to display  EKG None  Radiology No results found.  Procedures .Incision and Drainage  Date/Time: 10/29/2023 4:07 AM  Performed by: Elpidio Anis, PA-C Authorized by: Elpidio Anis, PA-C   Consent:    Consent obtained:  Verbal Universal protocol:    Procedure explained and questions answered to patient or proxy's satisfaction: yes     Patient identity confirmed:  Verbally with patient Location:    Type:  Abscess   Location:  Mouth   Mouth location:  Masticator space Anesthesia:    Anesthesia method:  Local infiltration   Local anesthetic:  Lidocaine 2% WITH epi Post-procedure details:    Procedure completion:  Tolerated well, no immediate complications Comments:     Initial incision made to face and level of the mandible with patient's understanding of scarring and permission. No purulent drainage. Second incision to intraoral buccal surface with copious bloody purulent drainage. Patient feeling improved after procedure.      Medications Ordered in ED Medications  HYDROcodone-acetaminophen (NORCO/VICODIN) 5-325 MG per tablet 1  tablet (has no administration in time range)  lidocaine-EPINEPHrine (XYLOCAINE W/EPI) 2 %-1:200000 (PF) injection 10 mL (10 mLs Intradermal Given by Other 10/29/23 0355)    ED Course/ Medical Decision Making/ A&P Clinical Course as of 10/29/23 0419  Sun Oct 29, 2023  0410 Recurrent abscess at anterior mandible extending to submental area. Likely odontogenic. Already on Augmentin. I&D performed with good drainage as per procedure notation. Continue Augmentin. Pain management provided.  [SU]    Clinical Course User Index [SU] Elpidio Anis, PA-C                                 Medical Decision Making Risk Prescription drug  management.           Final Clinical Impression(s) / ED Diagnoses Final diagnoses:  Abscess    Rx / DC Orders ED Discharge Orders          Ordered    HYDROcodone-acetaminophen (NORCO) 5-325 MG tablet  Every 6 hours PRN        10/29/23 0417              Elpidio Anis, PA-C 10/29/23 0419    Gilda Crease, MD 10/29/23 [phone]

## 2023-11-27 ENCOUNTER — Other Ambulatory Visit (HOSPITAL_COMMUNITY): Payer: Self-pay

## 2024-01-19 ENCOUNTER — Other Ambulatory Visit: Payer: Self-pay

## 2024-01-19 ENCOUNTER — Encounter: Payer: Self-pay | Admitting: Family

## 2024-01-19 ENCOUNTER — Ambulatory Visit (INDEPENDENT_AMBULATORY_CARE_PROVIDER_SITE_OTHER): Payer: Medicaid Other | Admitting: Family

## 2024-01-19 VITALS — BP 132/83 | HR 67 | Temp 97.7°F | Wt 179.6 lb

## 2024-01-19 DIAGNOSIS — F419 Anxiety disorder, unspecified: Secondary | ICD-10-CM | POA: Diagnosis not present

## 2024-01-19 DIAGNOSIS — Z609 Problem related to social environment, unspecified: Secondary | ICD-10-CM

## 2024-01-19 DIAGNOSIS — J029 Acute pharyngitis, unspecified: Secondary | ICD-10-CM | POA: Diagnosis not present

## 2024-01-19 DIAGNOSIS — Z23 Encounter for immunization: Secondary | ICD-10-CM | POA: Diagnosis not present

## 2024-01-19 DIAGNOSIS — Z113 Encounter for screening for infections with a predominantly sexual mode of transmission: Secondary | ICD-10-CM

## 2024-01-19 DIAGNOSIS — A539 Syphilis, unspecified: Secondary | ICD-10-CM | POA: Diagnosis not present

## 2024-01-19 DIAGNOSIS — B20 Human immunodeficiency virus [HIV] disease: Secondary | ICD-10-CM

## 2024-01-19 DIAGNOSIS — Z0000 Encounter for general adult medical examination without abnormal findings: Secondary | ICD-10-CM | POA: Diagnosis not present

## 2024-01-19 MED ORDER — SYMTUZA 800-150-200-10 MG PO TABS: 1.0000 | ORAL_TABLET | Freq: Every day | ORAL | 5 refills | Status: DC

## 2024-01-19 MED ORDER — OMEPRAZOLE 20 MG PO CPDR: 20.0000 mg | DELAYED_RELEASE_CAPSULE | Freq: Every day | ORAL | 2 refills | Status: DC

## 2024-01-19 MED ORDER — SULFAMETHOXAZOLE-TRIMETHOPRIM 800-160 MG PO TABS: 1.0000 | ORAL_TABLET | Freq: Every day | ORAL | 3 refills | Status: DC

## 2024-01-19 NOTE — Assessment & Plan Note (Signed)
 LaShea continues to have good adherence and tolerance to Symtuza  and Bactrim .  Reviewed previous lab work and discussed plan of care and U equals U.  Social determinants of health reviewed and continuously improving.  Continue work with Paramedic.  Check blood work.  Continue current dose of Symtuza  and Bactrim .  Covered by Medicaid with no problems obtaining medication.  Plan for follow-up in 3 months or sooner if needed with lab work on the same day.

## 2024-01-19 NOTE — Assessment & Plan Note (Signed)
 Previous RPR titer nonreactive in the setting of fluctuating 1-1 titers likely reflecting testing as opposed to new infection.  Check RPR.

## 2024-01-19 NOTE — Assessment & Plan Note (Signed)
 Discussed importance of safe sexual practice and condom use. Condoms and site specific STD testing offered.   Vaccinations reviewed with tetanus and Menveo updated. Routine dental care up to date.

## 2024-01-19 NOTE — Assessment & Plan Note (Signed)
 Experiencing sore throat x 1-1/2 weeks with question of possible reflux given worsening at night and with lying down with no other symptoms.  Was sexually active and will check for gonorrhea and chlamydia.  Will trial omeprazole.  Continue over-the-counter medications as needed for symptom relief and supportive care.

## 2024-01-19 NOTE — Assessment & Plan Note (Signed)
 [name] [name] has a GAD-7 score of 12 indicating moderate anxiety. Discussed counseling and resources provided in AVS. No medications indicated at the present time.

## 2024-01-19 NOTE — Patient Instructions (Addendum)
Nice to see you.  We will check your lab work today.  Continue to take your medication daily as prescribed.  Refills have been sent to the pharmacy.  Plan for follow up in 3 months or sooner if needed with lab work on the same day.  Have a great day and stay safe!  [facility] [phone] Crisis Line [phone]

## 2024-01-19 NOTE — Progress Notes (Addendum)
 Brief Narrative   Patient ID: [name] [name], adult    DOB: 1996-05-05, 28 y.o.   MRN: [id number]  [name] [name] is a 28 y/o transgender male diagnosed with HIV disease in March 2013 with risk factor of MSM. Initial viral load was 89,700 with CD4 count of 460. No history of opportunistic infection. Genosure with M184V (emtricitabine , lamivudine) and R263K. [id number] negative. ART experienced with Complera, Prezcobix , Descovy , Biktarvy  and now Symtuza .   Subjective:    Chief Complaint  Patient presents with   Follow-up    Sore throat x1.5 weeks/ worse at night      HPI:  [name] [name] is a 28 y.o. adult with AIDS/advanced HIV disease last seen on 10/20/2023 with improved adherence and good tolerance to Symtuza  and Bactrim .  Lab work with viral load of 334 and CD4 count of 131.  Kidney function, electrolytes within normal ranges.  Here today for routine follow-up.  [name] [name] has been doing well since her last office visit and continues to take Symtuza , and Bactrim .  Has missed about 4 doses of medication in the last 2 months.  Good tolerance with no adverse side effects.  Working full-time.  Has been able to secure housing and moved in on May 6.  Transportation via buses.  Has been using [employer] for food and is grateful for any groceries available.  Has concern about a sore throat going on for about 1-1/2 weeks with dry cough that is worse at night and when lying down.  Course of symptoms has remained the same.  No burning or acid like sensations.  Has been sexually active both orally and rectally and requesting STD testing.  Routine dental care is up-to-date per recommendations.  Healthcare maintenance reviewed.  Condoms offered.  Denies fevers, chills, night sweats, headaches, changes in vision, neck pain/stiffness, nausea, diarrhea, vomiting, lesions or rashes.  Lab Results  Component Value Date   CD4TCELL 8 (L) 10/20/2023   CD4TABS <35 (L) 10/15/2023   Lab Results  Component Value  Date   HIV1RNAQUANT 334 (H) 10/20/2023     No Known Allergies    Outpatient Medications Prior to Visit  Medication Sig Dispense Refill   amoxicillin -clavulanate (AUGMENTIN ) 875-125 MG tablet Take 1 tablet by mouth 2 (two) times daily. 42 tablet 0   sulfamethoxazole -trimethoprim  (BACTRIM  DS) 800-160 MG tablet Take 1 tablet by mouth daily. 30 tablet 3   SYMTUZA  800-150-200-10 MG TABS Take 1 tablet by mouth daily. 30 tablet 5   HYDROcodone -acetaminophen  (NORCO) 5-325 MG tablet Take 1 tablet by mouth every 6 (six) hours as needed for moderate pain (pain score 4-6). (Patient not taking: Reported on 01/19/2024) 8 tablet 0   No facility-administered medications prior to visit.     Past Medical History:  Diagnosis Date   Acute kidney failure (HCC) 04/2017   Anal fissure 2017   C. 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 anus (lgsil) 2013   Rectal gonorrhea    multiple episodes   Seizures (HCC) 2017   "probably stress related" (05/24/2017)   Syphilis    Syphilis, secondary 11/2011   tx'ed with IM PCN x 3  Past Surgical History:  Procedure Laterality Date   ANAL EXAMINATION UNDER ANESTHESIA     INCISION AND DRAINAGE ABSCESS Left 02/16/2019   Procedure: INCISION AND DRAINAGE ABSCESS;  Surgeon: Virgina Grills, MD;  Location: [facility] OR;  Service: ENT;  Laterality: Left;   WISDOM TOOTH EXTRACTION        Review of Systems  Constitutional:  Negative for appetite change, chills, diaphoresis, fatigue, fever and unexpected weight change.  HENT:  Positive for sore throat. Negative for congestion, dental problem, facial swelling, postnasal drip, rhinorrhea, sinus pressure, sneezing and  voice change.   Eyes:        Negative for acute change in vision  Respiratory:  Negative for chest tightness, shortness of breath and wheezing.   Cardiovascular:  Negative for chest pain.  Gastrointestinal:  Negative for diarrhea, nausea and vomiting.  Genitourinary:  Negative for dysuria, pelvic pain and vaginal discharge.  Musculoskeletal:  Negative for neck pain and neck stiffness.  Skin:  Negative for rash.  Neurological:  Negative for seizures, syncope, weakness and headaches.  Hematological:  Negative for adenopathy. Does not bruise/bleed easily.  Psychiatric/Behavioral:  Negative for hallucinations.       Objective:    BP 132/83   Pulse 67   Temp 97.7 F (36.5 C) (Oral)   Wt 179 lb 9.6 oz (81.5 kg)   SpO2 100%   BMI 19.73 kg/m  Nursing note and vital signs reviewed.  Physical Exam Constitutional:      General: She is not in acute distress.    Appearance: She is well-developed.  Cardiovascular:     Rate and Rhythm: Normal rate and regular rhythm.     Heart sounds: Normal heart sounds.  Pulmonary:     Effort: Pulmonary effort is normal.     Breath sounds: Normal breath sounds.  Skin:    General: Skin is warm and dry.  Neurological:     Mental Status: She is alert and oriented to person, place, and time.  Psychiatric:        Mood and Affect: Mood normal.         01/19/2024   11:15 AM 10/20/2023   11:19 AM 07/10/2023   10:54 AM 05/11/2023   10:33 AM 03/10/2023   10:37 AM  Depression screen PHQ 2/9  Decreased Interest 0 1 0 2 0  Down, Depressed, Hopeless 0 1 0 2 1  PHQ - 2 Score 0 2 0 4 1  Altered sleeping 0 3  3   Tired, decreased energy  2  2   Change in appetite 0 0  3   Feeling bad or failure about yourself  0 1  2   Trouble concentrating 0 0  3   Moving slowly or fidgety/restless 0 0  1   Suicidal thoughts 0 0  3   PHQ-9 Score 0 8  21   Difficult doing work/chores Not difficult at all   Somewhat difficult         01/19/2024   11:16 AM  GAD 7 :  Generalized Anxiety Score  Nervous, Anxious, on Edge 2  Control/stop worrying 2  Worry too much - different things 2  Trouble relaxing 2  Restless 0  Easily annoyed or irritable 2  Afraid - awful might happen 2  Total GAD 7 Score 12  Anxiety Difficulty Not difficult at all     Assessment & Plan:    Patient Active Problem List   Diagnosis Date Noted   Sore throat  01/19/2024   Anxiety 01/19/2024   Abscess of face 10/15/2023   Facial cellulitis 10/15/2023   Polysubstance use disorder 03/10/2023   Male-to-male transgender person 02/07/2023   Poor social situation 02/07/2023   Syphilis 06/02/2020   Healthcare maintenance 06/02/2020   Axillary abscess 02/13/2020   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  Reviewed previous lab work and discussed plan of care and U equals U.  Social determinants of health reviewed and continuously improving.  Continue work with Paramedic.  Check blood work.  Continue current dose of Symtuza  and Bactrim .  Covered by Medicaid with no problems obtaining medication.  Plan for follow-up in 3 months or sooner if needed with lab work on the same day.      Relevant Medications   sulfamethoxazole -trimethoprim  (BACTRIM  DS) 800-160 MG tablet   SYMTUZA  800-150-200-10 MG TABS   Other Relevant Orders   HIV RNA, RTPCR W/R GT (RTI, PI,INT)   T-helper cells (CD4) count (not at [facility])    MENINGOCOCCAL MCV4O (Completed)   Tdap vaccine greater than or equal to 7yo IM (Completed)   Syphilis   Previous RPR titer nonreactive in the setting of fluctuating 1-1 titers likely reflecting testing as opposed to new infection.  Check RPR.      Relevant Medications   sulfamethoxazole -trimethoprim  (BACTRIM  DS) 800-160 MG tablet   SYMTUZA  800-150-200-10 MG TABS   Other Relevant Orders   RPR   Healthcare maintenance   Discussed importance of safe sexual practice and condom use. Condoms and site specific STD testing offered.   Vaccinations reviewed with tetanus and Menveo updated. Routine dental care up to date.      Poor social situation   Working full-time and has housing starting on May 6.  Access to food still limited and will supply groceries as able.  Congratulated on continued improvements.      Sore throat   Experiencing sore throat x 1-1/2 weeks with question of possible reflux given worsening at night and with lying down with no other symptoms.  Was sexually active and will check for gonorrhea and chlamydia.  Will trial omeprazole.  Continue over-the-counter medications as needed for symptom relief and supportive care.      Anxiety   [name] [name] has a GAD-7 score of 12 indicating moderate anxiety. Discussed counseling and resources provided in AVS. No medications indicated at the present time.      Other Visit Diagnoses       Screening for STDs (sexually transmitted diseases)       Relevant Orders   GC/CT Probe, Amp (Throat)   CT/NG RNA, TMA Rectal   C. trachomatis/N. gonorrhoeae RNA     Need for tetanus booster       Relevant Orders   Tdap vaccine greater than or equal to 7yo IM (Completed)     Need for meningococcal vaccination       Relevant Orders   MENINGOCOCCAL  MCV4O (Completed)        I have discontinued Thurston Flow L. Carolynne Citron "[name] [name]"'s amoxicillin -clavulanate. I am also having her start on omeprazole. Additionally, I am having her maintain her  HYDROcodone -acetaminophen , sulfamethoxazole -trimethoprim , and Symtuza .   Meds ordered this encounter  Medications   omeprazole (PRILOSEC) 20 MG capsule    Sig: Take 1 capsule (20 mg total) by mouth daily.    Dispense:  30 capsule    Refill:  2    Supervising Provider:   SNIDER, CYNTHIA [[id number]]   sulfamethoxazole -trimethoprim  (BACTRIM  DS) 800-160 MG tablet    Sig: Take 1 tablet by mouth daily.    Dispense:  30 tablet    Refill:  3    Supervising Provider:   Liane Redman [[id number]]   SYMTUZA  800-150-200-10 MG TABS    Sig: Take 1 tablet by mouth daily.    Dispense:  30 tablet    Refill:  5    Supervising Provider:   SNIDER, CYNTHIA [phone]    Prescription Type::   Renewal     Follow-up: Return in about 3 months (around 04/19/2024). or sooner if needed.    Marlan Silva, MSN, FNP-C Nurse Practitioner [facility] Main number: [phone]

## 2024-01-19 NOTE — Assessment & Plan Note (Signed)
 Working full-time and has housing starting on May 6.  Access to food still limited and will supply groceries as able.  Congratulated on continued improvements.

## 2024-01-20 LAB — CT/NG RNA, TMA RECTAL
Chlamydia Trachomatis RNA: NOT DETECTED
Neisseria Gonorrhoeae RNA: NOT DETECTED

## 2024-01-20 LAB — C. TRACHOMATIS/N. GONORRHOEAE RNA
C. trachomatis RNA, TMA: NOT DETECTED
N. gonorrhoeae RNA, TMA: NOT DETECTED

## 2024-01-20 LAB — GC/CHLAMYDIA PROBE, AMP (THROAT)
Chlamydia trachomatis RNA: NOT DETECTED
Neisseria gonorrhoeae RNA: NOT DETECTED

## 2024-01-23 LAB — T-HELPER CELLS (CD4) COUNT (NOT AT ARMC)
Absolute CD4: 190 {cells}/uL — ABNORMAL LOW (ref 490–1740)
CD4 T Helper %: 11 % — ABNORMAL LOW (ref 30–61)
Total lymphocyte count: 1680 {cells}/uL (ref 850–3900)

## 2024-01-23 LAB — HIV RNA, RTPCR W/R GT (RTI, PI,INT)
HIV 1 RNA Quant: <20 {copies}/mL — AB
HIV-1 RNA Quant, Log: <1.3 {Log_copies}/mL — AB

## 2024-01-23 LAB — RPR: RPR Ser Ql: NONREACTIVE

## 2024-01-24 ENCOUNTER — Other Ambulatory Visit: Payer: Self-pay

## 2024-01-24 ENCOUNTER — Encounter (HOSPITAL_COMMUNITY): Payer: Self-pay

## 2024-01-24 ENCOUNTER — Emergency Department (HOSPITAL_COMMUNITY)
Admission: EM | Admit: 2024-01-24 | Discharge: 2024-01-24 | Disposition: A | Discharge status: Home / Self Care | Attending: Emergency Medicine | Admitting: Emergency Medicine

## 2024-01-24 DIAGNOSIS — H5789 Other specified disorders of eye and adnexa: Secondary | ICD-10-CM | POA: Diagnosis present

## 2024-01-24 DIAGNOSIS — H1032 Unspecified acute conjunctivitis, left eye: Secondary | ICD-10-CM | POA: Diagnosis not present

## 2024-01-24 MED ORDER — CEPHALEXIN 500 MG PO CAPS
500.0000 mg | ORAL_CAPSULE | Freq: Once | ORAL | Status: AC
  Administered 2024-01-24: 500 mg via ORAL
  Filled 2024-01-24: qty 1

## 2024-01-24 MED ORDER — ERYTHROMYCIN 5 MG/GM OP OINT: TOPICAL_OINTMENT | OPHTHALMIC | 0 refills | Status: DC

## 2024-01-24 MED ORDER — CEPHALEXIN 500 MG PO CAPS: 500.0000 mg | ORAL_CAPSULE | Freq: Three times a day (TID) | ORAL | 0 refills | Status: AC

## 2024-01-24 MED ORDER — ERYTHROMYCIN 5 MG/GM OP OINT
TOPICAL_OINTMENT | Freq: Once | OPHTHALMIC | Status: AC
  Filled 2024-01-24: qty 3.5

## 2024-01-24 NOTE — ED Notes (Signed)
 Pt endorses he does not feel safe at home. States he will be moving out of this "bad situation" next week. 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 Pt states if he is unable to get the drugs, he is "not one to do anything for it."

## 2024-01-24 NOTE — ED Provider Notes (Signed)
 [place] EMERGENCY DEPARTMENT AT [facility] Provider Note   CSN: [id number] Arrival date & time: 01/24/24  1219     History  CC: Eye pain  [name] [name] (Prefers She/Her "Hays Lipschutz") is a 28 y.o. adult with history of HIV presenting to ED with left eye discharge.  He reports this ongoing for 2 days.  She says she thinks her make-up brushes may have contaminated her eye.  She does not wear contacts.  She says she is under a lot of stressors at home.  She works at [employer]  HPI     Home Medications Prior to Admission medications   Medication Sig Start Date End Date Taking? Authorizing Provider  cephALEXin (KEFLEX) 500 MG capsule Take 1 capsule (500 mg total) by mouth 3 (three) times daily for 7 days. 01/24/24 01/31/24 Yes Liel Rudden, Janalyn Me, MD  erythromycin  ophthalmic ointment Place a 1/2 inch ribbon of ointment into the lower left eyelid four times daily for 7 days 01/24/24  Yes Tamiko Leopard, Janalyn Me, MD  omeprazole (PRILOSEC) 20 MG capsule Take 1 capsule (20 mg total) by mouth daily. 01/19/24   Calone, Gregory D, FNP  sulfamethoxazole -trimethoprim  (BACTRIM  DS) 800-160 MG tablet Take 1 tablet by mouth daily. 01/19/24   Calone, Gregory D, FNP  SYMTUZA  800-150-200-10 MG TABS Take 1 tablet by mouth daily. 01/19/24   Calone, Gregory D, FNP      Allergies    Patient has no known allergies.    Review of Systems   Review of Systems  Physical Exam Updated Vital Signs BP 106/81 (BP Location: Left Arm)   Pulse (!) 105   Temp 97.9 F (36.6 C) (Oral)   Resp 16   SpO2 94%  Physical Exam Constitutional:      General: She is not in acute distress. HENT:     Head: Normocephalic and atraumatic.  Eyes:     Conjunctiva/sclera: Conjunctivae normal.     Comments: Purulent drainage of left external eye, conjunctiva injected, no hypopion, surrounding erythema and edema of the left eye with no proptosis  Cardiovascular:     Rate and Rhythm: Regular rhythm. Tachycardia present.   Pulmonary:     Effort: Pulmonary effort is normal. No respiratory distress.  Abdominal:     General: There is no distension.     Tenderness: There is no abdominal tenderness.  Skin:    General: Skin is warm and dry.  Neurological:     General: No focal deficit present.     Mental Status: She is alert. Mental status is at baseline.  Psychiatric:        Mood and Affect: Mood normal.        Behavior: Behavior normal.     ED Results / Procedures / Treatments   Labs (all labs ordered are listed, but only abnormal results are displayed) Labs Reviewed - No data to display  EKG None  Radiology No results found.  Procedures Procedures    Medications Ordered in ED Medications  cephALEXin (KEFLEX) capsule 500 mg (500 mg Oral Given 01/24/24 1403)  erythromycin  ophthalmic ointment ( Left Eye Given 01/24/24 1402)    ED Course/ Medical Decision Making/ A&P                                 Medical Decision Making Risk Prescription drug management.   Patient is here with left eye infection consistent with bacterial conjunctivitis.  This  is likely from contaminated make-up brush.  Patient will be treated with Romycin ointment as well as Keflex for potential preseptal cellulitis.  I do not see evidence otherwise of orbital cellulitis, endophthalmitis, hypopyon.  I do not see evidence of abscess or dental infection on my exam.  Patient will need close follow-up with ophthalmology and this information was provided to her  There is no indication for hospitalization at this time.  The patient reports that he is in a "bad situation" at home, and shelter resources can be provided, but this is not grounds for hospitalization.  She is not needing involuntary commitment.        Final Clinical Impression(s) / ED Diagnoses Final diagnoses:  Acute bacterial conjunctivitis of left eye    Rx / DC Orders ED Discharge Orders          Ordered    cephALEXin (KEFLEX) 500 MG capsule  3  times daily        01/24/24 1355    erythromycin  ophthalmic ointment        01/24/24 1355              Arvilla Birmingham, MD 01/24/24 1444

## 2024-01-24 NOTE — Discharge Instructions (Addendum)
 Please wash your hands thoroughly anytime you touch your eye or your face.  Conjunctivitis can be contagious and spread to others.  I recommend you stay out of work for the period of time listed below, after you finish treatment.  I also recommend you follow-up with an eye specialist.  Please call the number above to schedule an appointment with the eye doctor.

## 2024-03-06 ENCOUNTER — Encounter (HOSPITAL_COMMUNITY): Payer: Self-pay

## 2024-03-06 ENCOUNTER — Other Ambulatory Visit: Payer: Self-pay

## 2024-03-06 ENCOUNTER — Emergency Department (HOSPITAL_COMMUNITY)
Admission: EM | Admit: 2024-03-06 | Discharge: 2024-03-06 | Disposition: A | Discharge status: Home / Self Care | Attending: Emergency Medicine | Admitting: Emergency Medicine

## 2024-03-06 DIAGNOSIS — L02811 Cutaneous abscess of head [any part, except face]: Secondary | ICD-10-CM | POA: Diagnosis not present

## 2024-03-06 DIAGNOSIS — Z21 Asymptomatic human immunodeficiency virus [HIV] infection status: Secondary | ICD-10-CM | POA: Diagnosis not present

## 2024-03-06 DIAGNOSIS — R220 Localized swelling, mass and lump, head: Secondary | ICD-10-CM | POA: Diagnosis present

## 2024-03-06 MED ORDER — HYDROCODONE-ACETAMINOPHEN 5-325 MG PO TABS
1.0000 | ORAL_TABLET | Freq: Once | ORAL | Status: AC
  Administered 2024-03-06: 1 via ORAL
  Filled 2024-03-06: qty 1

## 2024-03-06 MED ORDER — DOXYCYCLINE HYCLATE 100 MG PO CAPS: 100.0000 mg | ORAL_CAPSULE | Freq: Two times a day (BID) | ORAL | 0 refills | Status: DC

## 2024-03-06 MED ORDER — LIDOCAINE-EPINEPHRINE (PF) 2 %-1:200000 IJ SOLN
10.0000 mL | Freq: Once | INTRAMUSCULAR | Status: AC
  Administered 2024-03-06: 10 mL via INTRADERMAL
  Filled 2024-03-06: qty 20

## 2024-03-06 MED ORDER — IBUPROFEN 600 MG PO TABS: 600.0000 mg | ORAL_TABLET | Freq: Four times a day (QID) | ORAL | 0 refills | Status: DC | PRN

## 2024-03-06 NOTE — Discharge Instructions (Signed)
 You have an abscess to the back of your scalp that was incised and drained in the ER.  Take antibiotic as prescribed.  Apply warm moist compress to affected area several times daily to aid with healing.  Take ibuprofen as needed for pain.

## 2024-03-06 NOTE — ED Triage Notes (Signed)
 Pt states she has abscess on the back of her neck for the past week, states she scratched it this morning on accident.

## 2024-03-06 NOTE — ED Provider Notes (Signed)
 [place] EMERGENCY DEPARTMENT AT [facility] Provider Note   CSN: [id number] Arrival date & time: 03/06/24  1325     History  Chief Complaint  Patient presents with   Abscess    [name] [name] is a 28 y.o. adult.  The history is provided by the patient and medical records. No language interpreter was used.  Abscess Associated symptoms: no fever      28 year old transgender male history of HIV/AIDS presenting with concerns of rash.  Patient report progressive worsening pain and swelling to the back of his head ongoing for little bit over a week.  He mention he initially scratched a bump in the back of his head when the symptoms first started.  He now endorsed increasing pain but denies any fever and denies any trauma.  He is unsure his last tetanus status but states that he should be up-to-date.  He is unable to recall his last CD4 count or viral load but was told that he is improving.  He is compliant with his medication.  Home Medications Prior to Admission medications   Medication Sig Start Date End Date Taking? Authorizing Provider  erythromycin  ophthalmic ointment Place a 1/2 inch ribbon of ointment into the lower left eyelid four times daily for 7 days 01/24/24   Arvilla Birmingham, MD  omeprazole  (PRILOSEC) 20 MG capsule Take 1 capsule (20 mg total) by mouth daily. 01/19/24   Calone, Gregory D, FNP  sulfamethoxazole -trimethoprim  (BACTRIM  DS) 800-160 MG tablet Take 1 tablet by mouth daily. 01/19/24   Calone, Gregory D, FNP  SYMTUZA  800-150-200-10 MG TABS Take 1 tablet by mouth daily. 01/19/24   Calone, Gregory D, FNP      Allergies    Patient has no known allergies.    Review of Systems   Review of Systems  Constitutional:  Negative for fever.  Skin:  Positive for wound.    Physical Exam Updated Vital Signs BP (!) 142/93 (BP Location: Right Arm)   Pulse 71   Temp 97.9 F (36.6 C) (Oral)   Resp 15   SpO2 100%  Physical Exam Constitutional:       General: She is not in acute distress.    Appearance: She is well-developed.  HENT:     Head: Atraumatic.     Comments: There is an area of induration approximately 5 cm in diameter noted to the occipital scalp with a small overlying scabbed.  Area is tender to palpation.  No warmth or erythema noted. adjacent posterior right cervical shotty lymphadenopathy noted Eyes:     Conjunctiva/sclera: Conjunctivae normal.  Musculoskeletal:     Cervical back: Normal range of motion and neck supple.  Skin:    Findings: No rash.  Neurological:     Mental Status: She is alert.     ED Results / Procedures / Treatments   Labs (all labs ordered are listed, but only abnormal results are displayed) Labs Reviewed - No data to display  EKG None  Radiology No results found.  Procedures .Incision and Drainage  Date/Time: 03/06/2024 3:18 PM  Performed by: Debbra Fairy, PA-C Authorized by: Debbra Fairy, PA-C   Consent:    Consent obtained:  Verbal   Consent given by:  Patient   Risks discussed:  Bleeding, incomplete drainage, pain and damage to other organs   Alternatives discussed:  No treatment Universal protocol:    Procedure explained and questions answered to patient or proxy's satisfaction: yes     Relevant documents present  and verified: yes     Test results available : yes     Imaging studies available: yes     Required blood products, implants, devices, and special equipment available: yes     Site/side marked: yes     Immediately prior to procedure, a time out was called: yes     Patient identity confirmed:  Verbally with patient Location:    Type:  Abscess   Size:  5   Location:  Head   Head location:  Scalp Pre-procedure details:    Skin preparation:  Betadine Sedation:    Sedation type:  None Anesthesia:    Anesthesia method:  Local infiltration   Local anesthetic:  Lidocaine  2% WITH epi Procedure type:    Complexity:  Simple Procedure details:    Incision types:   Single straight   Incision depth:  Subcutaneous   Wound management:  Probed and deloculated, irrigated with saline and extensive cleaning   Drainage:  Purulent   Drainage amount:  Moderate   Packing materials:  None Post-procedure details:    Procedure completion:  Tolerated well, no immediate complications     Medications Ordered in ED Medications  lidocaine -EPINEPHrine  (XYLOCAINE  W/EPI) 2 %-1:200000 (PF) injection 10 mL (has no administration in time range)    ED Course/ Medical Decision Making/ A&P                                 Medical Decision Making Risk Prescription drug management.   BP (!) 142/93 (BP Location: Right Arm)   Pulse 71   Temp 97.9 F (36.6 C) (Oral)   Resp 15   SpO2 100%   27:34 PM  28 year old transgender male history of HIV/AIDS presenting with concerns of rash.  Patient report progressive worsening pain and swelling to the back of his head ongoing for over a week.  He mention he initially scratched a bump in the back of his head when the symptoms first started.  He now endorsed increasing pain but denies any fever and denies any trauma.  He is unsure his last tetanus status but states that he should be up-to-date.  He is unable to recall his last CD4 count or viral load but was told that he is improving by his ID specialist when he saw him last month.  He is compliant with his medication.  On exam patient is resting comfortably appears to be in no acute discomfort.  He has an area of induration to the occipital scalp consistent with a cutaneous abscess amenable for I&D.  He has some adjacent posterior cervical lymphadenopathy.  Successful incision and drainage of an abscess to the occipital scalp.  Dressing placed.  No packing was placed.  Patient discharged home with doxycycline  and pain medication with appropriate wound care instruction.  Return precaution given.  DDx: Abscess, cellulitis, kerion, hematoma, malignancy        Final Clinical  Impression(s) / ED Diagnoses Final diagnoses:  Abscess of scalp    Rx / DC Orders ED Discharge Orders          Ordered    doxycycline  (VIBRAMYCIN ) 100 MG capsule  2 times daily        03/06/24 1525    ibuprofen (ADVIL) 600 MG tablet  Every 6 hours PRN        03/06/24 1525              Lamia Mariner, PA-C  03/06/24 1526    Sueellen Emery, MD 03/06/24 1529

## 2024-04-19 ENCOUNTER — Other Ambulatory Visit: Payer: Self-pay

## 2024-04-19 ENCOUNTER — Ambulatory Visit: Admitting: Family

## 2024-04-19 ENCOUNTER — Encounter: Payer: Self-pay | Admitting: Family

## 2024-04-19 VITALS — BP 117/78 | HR 89 | Temp 98.2°F | Ht >= 80 in | Wt 172.0 lb

## 2024-04-19 DIAGNOSIS — Z113 Encounter for screening for infections with a predominantly sexual mode of transmission: Secondary | ICD-10-CM

## 2024-04-19 DIAGNOSIS — Z0000 Encounter for general adult medical examination without abnormal findings: Secondary | ICD-10-CM

## 2024-04-19 DIAGNOSIS — B20 Human immunodeficiency virus [HIV] disease: Secondary | ICD-10-CM

## 2024-04-19 MED ORDER — SYMTUZA 800-150-200-10 MG PO TABS: 1.0000 | ORAL_TABLET | Freq: Every day | ORAL | 5 refills | Status: DC

## 2024-04-19 MED ORDER — SULFAMETHOXAZOLE-TRIMETHOPRIM 800-160 MG PO TABS: 1.0000 | ORAL_TABLET | Freq: Every day | ORAL | 3 refills | Status: DC

## 2024-04-19 NOTE — Patient Instructions (Addendum)
 Nice to see you.  We will check your lab work today.  Continue to take your medication daily as prescribed.  Refills have been sent to the pharmacy.  Plan for follow up in 6 weeks or sooner if needed with lab work on the same day.  Have a great day and stay safe!   Smoking Cessation: QuitlineNC 1-800-QUIT-NOW 787-034-4163); Espaol: 1-855-Djelo-Ya (1-(313)808-8778) http://carroll-castaneda.info/

## 2024-04-19 NOTE — Assessment & Plan Note (Signed)
 Discussed importance of safe sexual practice and condom use. Condoms and site specific STD testing offered.  Vaccinations reviewed and deferred following counseling.  Dental care up to date.

## 2024-04-19 NOTE — Assessment & Plan Note (Addendum)
 [name] [name] likely has poorly controlled virus secondary to being off medication for the last 3 weeks. Reviewed previous lab work and discussed plan of care, U equals U and importance of taking medication regularly to prevent disease progression and complications in the future. Had a discussion regarding LAI medications and with he improved attendance I think it would be okay to consider transition to [place]enuva. Reviewed the importance of follow up, potential side effects, and scheduling.Check lab work today including genotype to determine any pre-existing resistance (none noted on previous genotype). Continue current dose of Symtuza . Will need to restart Bactrim  for now given low CD4 count and likely detectable viral load. Plan for follow up in 6 weeks or sooner if needed with lab work on the same day.

## 2024-04-19 NOTE — Progress Notes (Signed)
 Brief Narrative   Patient ID: [name] [name] [place], adult    DOB: May 03, 1996, 28 y.o.   MRN: [id number]  [name] [name] is a 28 y/o transgender male diagnosed with HIV disease in March 2013 with risk factor of MSM. Initial viral load was 89,700 with CD4 count of 460. History of cryptosporidium infection and candia. Genosure with M184V (emtricitabine , lamivudine) and R263K. HLAB5701 negative. ART experienced with Complera, Prezcobix , Descovy , Biktarvy  and now Symtuza .   Subjective:   Chief Complaint  Patient presents with   Follow-up    HPI:  [name] [name] is a 28 y.o. adult with HIV disease last seen on 01/19/24 with well controlled virus and good adherence and tolerance to Symtuza  and Bactrim . Viral load was undetectable and CD4 count 190. STD testing was negative. Here today for follow up.  [name] [name] has been doing okay since her last office visit and is here today with her [employer]. Has been off medication for the past 3 weeks and not taking Bactrim  secondary to inconsistent work schedules. Committed to improving her treatment adherence and now back on medication with a set work schedule. No problems obtaining medication from the pharmacy and covered by Medicaid. Housing and access to food are stable. Transportation via bus. Interested in pursuing LAI medication because of the challenges with taking pills on a daily basis. Currently sexually active with condoms and site specific STD testing offered. Healthcare maintenance reviewed.   Denies fevers, chills, night sweats, headaches, changes in vision, neck pain/stiffness, nausea, diarrhea, vomiting, lesions or rashes.  Lab Results  Component Value Date   CD4TCELL 11 (L) 01/19/2024   CD4TABS <35 (L) 10/15/2023   Lab Results  Component Value Date   HIV1RNAQUANT <20 DETECTED (A) 01/19/2024     No Known Allergies    Outpatient Medications Prior to Visit  Medication Sig Dispense Refill   ibuprofen  (ADVIL ) 600 MG tablet Take 1  tablet (600 mg total) by mouth every 6 (six) hours as needed. 30 tablet 0   SYMTUZA  800-150-200-10 MG TABS Take 1 tablet by mouth daily. 30 tablet 5   doxycycline  (VIBRAMYCIN ) 100 MG capsule Take 1 capsule (100 mg total) by mouth 2 (two) times daily. (Patient not taking: Reported on 04/19/2024) 20 capsule 0   erythromycin  ophthalmic ointment Place a 1/2 inch ribbon of ointment into the lower left eyelid four times daily for 7 days (Patient not taking: Reported on 04/19/2024) 3.5 g 0   omeprazole  (PRILOSEC) 20 MG capsule Take 1 capsule (20 mg total) by mouth daily. (Patient not taking: Reported on 04/19/2024) 30 capsule 2   sulfamethoxazole -trimethoprim  (BACTRIM  DS) 800-160 MG tablet Take 1 tablet by mouth daily. (Patient not taking: Reported on 04/19/2024) 30 tablet 3   No facility-administered medications prior to visit.     Past Medical History:  Diagnosis Date   Acute kidney failure (HCC) 04/2017   Anal fissure 2017   C. 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 anus (lgsil) 2013   Rectal gonorrhea    multiple episodes   Seizures (HCC) 2017   probably stress related (05/24/2017)   Syphilis    Syphilis, secondary  11/2011   tx'ed with IM PCN x 3     Past Surgical History:  Procedure Laterality Date   ANAL EXAMINATION UNDER ANESTHESIA     INCISION AND DRAINAGE ABSCESS Left 02/16/2019   Procedure: INCISION AND DRAINAGE ABSCESS;  Surgeon: Carlie Clark, MD;  Location: [facility] OR;  Service: ENT;  Laterality: Left;   WISDOM TOOTH EXTRACTION          Review of Systems  Constitutional:  Negative for appetite change, chills, diaphoresis, fatigue, fever and unexpected weight change.  Eyes:         Negative for acute change in vision  Respiratory:  Negative for chest tightness, shortness of breath and wheezing.   Cardiovascular:  Negative for chest pain.  Gastrointestinal:  Negative for diarrhea, nausea and vomiting.  Genitourinary:  Negative for dysuria, pelvic pain and vaginal discharge.  Musculoskeletal:  Negative for neck pain and neck stiffness.  Skin:  Negative for rash.  Neurological:  Negative for seizures, syncope, weakness and headaches.  Hematological:  Negative for adenopathy. Does not bruise/bleed easily.  Psychiatric/Behavioral:  Negative for hallucinations.      Objective:   BP 117/78   Pulse 89   Temp 98.2 F (36.8 C) (Oral)   Ht 6' 8 (2.032 m)   Wt 172 lb (78 kg)   SpO2 99%   BMI 18.90 kg/m  Nursing note and vital signs reviewed.  Physical Exam Constitutional:      General: She is not in acute distress.    Appearance: She is well-developed.  Eyes:     Conjunctiva/sclera: Conjunctivae normal.  Cardiovascular:     Rate and Rhythm: Normal rate and regular rhythm.     Heart sounds: Normal heart sounds. No murmur heard.    No friction rub. No gallop.  Pulmonary:     Effort: Pulmonary effort is normal. No respiratory distress.     Breath sounds: Normal breath sounds. No wheezing or rales.  Chest:     Chest wall: No tenderness.  Musculoskeletal:     Cervical back: Neck supple.  Lymphadenopathy:     Cervical: No cervical adenopathy.  Skin:    General: Skin is warm and dry.     Findings: No rash.  Neurological:     Mental Status: She is alert.  Psychiatric:        Mood and Affect: Mood normal.          01/19/2024   11:15 AM 10/20/2023   11:19 AM 07/10/2023   10:54 AM 05/11/2023   10:33 AM 03/10/2023   10:37 AM  Depression screen PHQ 2/9  Decreased Interest 0 1 0 2 0  Down, Depressed, Hopeless 0 1 0 2 1  PHQ - 2 Score 0 2 0 4 1  Altered sleeping 0 3  3   Tired, decreased energy  2  2   Change in appetite 0 0  3   Feeling bad or  failure about yourself  0 1  2   Trouble concentrating 0 0  3   Moving slowly or fidgety/restless 0 0  1   Suicidal thoughts 0 0  3   PHQ-9 Score 0 8  21   Difficult doing work/chores Not difficult at all   Somewhat difficult         01/19/2024   11:16 AM  GAD 7 : Generalized Anxiety Score  Nervous, Anxious, on Edge 2  Control/stop worrying 2  Worry too much - different things 2  Trouble relaxing 2  Restless 0  Easily annoyed or irritable 2  Afraid - awful might happen 2  Total GAD 7 Score 12  Anxiety Difficulty Not difficult at all     The ASCVD Risk score (Arnett DK, et al., 2019) failed to calculate for the following reasons:   The 2019 ASCVD risk score is only valid for ages 40 to 90      Assessment & Plan:    Patient Active Problem List   Diagnosis Date Noted   Sore throat 01/19/2024   Anxiety 01/19/2024   Abscess of face 10/15/2023   Facial cellulitis 10/15/2023   Polysubstance use disorder 03/10/2023   Male-to-male transgender person 02/07/2023   Poor social situation 02/07/2023   Syphilis 06/02/2020   Healthcare maintenance 06/02/2020   Axillary abscess 02/13/2020   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, 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   Enteropathogenic Escherichia coli infection 05/26/2017   Acute renal failure (ARF) (HCC) 05/24/2017   Abnormal brain MRI 02/07/2017   Rash 11/15/2016   Hypotension 11/05/2016   Shingles 09/14/2016   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 Reviewed previous lab work and discussed plan of care, U equals U and importance of taking medication regularly to prevent disease progression and complications in the future. Had  a discussion regarding LAI medications and with he improved attendance I think it would be okay to consider transition to [place]enuva. Reviewed the importance of follow up, potential side effects, and scheduling.Check lab work today including genotype to determine any pre-existing resistance (none noted on previous genotype). Continue current dose of Symtuza . Will need to restart Bactrim  for now given low CD4 count and likely detectable viral load. Plan for follow up in 6 weeks or sooner if needed with lab work on the same day.       Relevant Medications   sulfamethoxazole -trimethoprim  (BACTRIM  DS) 800-160 MG tablet   SYMTUZA  800-150-200-10 MG TABS   Healthcare maintenance   Discussed importance of safe sexual practice and condom use. Condoms and site specific STD testing offered.  Vaccinations reviewed and deferred following counseling.  Dental care up to date.       Other Visit Diagnoses       HIV disease (HCC)    -  Primary   Relevant Medications   sulfamethoxazole -trimethoprim  (BACTRIM  DS) 800-160 MG tablet   SYMTUZA  800-150-200-10 MG TABS   Other Relevant Orders   COMPLETE METABOLIC PANEL WITHOUT GFR   T-helper cells (CD4) count (not at [facility])   HIV RNA, RTPCR W/R GT (RTI, PI,INT)     Screening for venereal disease       Relevant Orders   GC/CT Probe, Amp (Throat)   CT/NG RNA, TMA Rectal   C. trachomatis/N. gonorrhoeae RNA   RPR        I am having [name] L. Waddell Mason maintain her omeprazole , erythromycin , doxycycline , ibuprofen , sulfamethoxazole -trimethoprim , and Symtuza .   Meds ordered this encounter  Medications   sulfamethoxazole -trimethoprim  (BACTRIM  DS) 800-160 MG tablet    Sig: Take 1 tablet by mouth daily.    Dispense:  30 tablet    Refill:  3    Supervising Provider:   SNIDER, CYNTHIA [[id number]]  SYMTUZA  800-150-200-10 MG TABS    Sig: Take 1 tablet by mouth daily.    Dispense:  30 tablet    Refill:  5    Supervising Provider:   SNIDER, CYNTHIA [phone]     Prescription Type::   Renewal     Follow-up: Return in about 6 weeks (around 05/31/2024). or sooner if needed.    Cathlyn July, MSN, FNP-C Nurse Practitioner [facility] Main number: [phone]

## 2024-04-20 LAB — CT/NG RNA, TMA RECTAL
Chlamydia Trachomatis RNA: NOT DETECTED
Neisseria Gonorrhoeae RNA: NOT DETECTED

## 2024-04-20 LAB — GC/CHLAMYDIA PROBE, AMP (THROAT)
Chlamydia trachomatis RNA: NOT DETECTED
Neisseria gonorrhoeae RNA: NOT DETECTED

## 2024-04-20 LAB — C. TRACHOMATIS/N. GONORRHOEAE RNA
C. trachomatis RNA, TMA: NOT DETECTED
N. gonorrhoeae RNA, TMA: NOT DETECTED

## 2024-04-30 LAB — COMPLETE METABOLIC PANEL WITHOUT GFR
AG Ratio: 0.8 (calc) — ABNORMAL LOW (ref 1.0–2.5)
ALT: 7 U/L — ABNORMAL LOW (ref 9–46)
AST: 13 U/L (ref 10–40)
Albumin: 4.2 g/dL (ref 3.6–5.1)
Alkaline phosphatase (APISO): 82 U/L (ref 36–130)
BUN: 15 mg/dL (ref 7–25)
CO2: 27 mmol/L (ref 20–32)
Calcium: 9.3 mg/dL (ref 8.6–10.3)
Chloride: 101 mmol/L (ref 98–110)
Creat: 1.21 mg/dL (ref 0.60–1.26)
Globulin: 5.2 g/dL — ABNORMAL HIGH (ref 1.9–3.7)
Glucose, Bld: 81 mg/dL (ref 65–99)
Potassium: 4.2 mmol/L (ref 3.5–5.3)
Sodium: 134 mmol/L — ABNORMAL LOW (ref 135–146)
Total Bilirubin: 0.4 mg/dL (ref 0.2–1.2)
Total Protein: 9.4 g/dL — ABNORMAL HIGH (ref 6.1–8.1)

## 2024-04-30 LAB — T-HELPER CELLS (CD4) COUNT (NOT AT ARMC)
Absolute CD4: 137 {cells}/uL — ABNORMAL LOW (ref 490–1740)
CD4 T Helper %: 10 % — ABNORMAL LOW (ref 30–61)
Total lymphocyte count: 1398 {cells}/uL (ref 850–3900)

## 2024-04-30 LAB — HIV-1 INTEGRASE GENOTYPE

## 2024-04-30 LAB — HIV-1 GENOTYPE: HIV-1 Genotype: DETECTED — AB

## 2024-04-30 LAB — HIV RNA, RTPCR W/R GT (RTI, PI,INT)
HIV 1 RNA Quant: 303000 {copies}/mL — ABNORMAL HIGH
HIV-1 RNA Quant, Log: 5.48 {Log_copies}/mL — ABNORMAL HIGH

## 2024-04-30 LAB — RPR TITER: RPR Titer: 1:1024 {titer} — ABNORMAL HIGH

## 2024-04-30 LAB — T PALLIDUM AB: T Pallidum Abs: POSITIVE — AB

## 2024-04-30 LAB — RPR: RPR Ser Ql: REACTIVE — AB

## 2024-05-02 ENCOUNTER — Telehealth: Payer: Self-pay

## 2024-05-02 NOTE — Telephone Encounter (Signed)
 Patient positive for syphilis. Per Cathlyn patient will need doxycycline  100 mg BID x 14 days.  I attempted to reach the patient an no VM setup. Rosemaria Inabinet ONEIDA Ligas, CMA

## 2024-05-03 ENCOUNTER — Other Ambulatory Visit: Payer: Self-pay

## 2024-05-03 MED ORDER — DOXYCYCLINE HYCLATE 100 MG PO TABS: 100.0000 mg | ORAL_TABLET | Freq: Two times a day (BID) | ORAL | 0 refills | Status: AC

## 2024-05-29 ENCOUNTER — Ambulatory Visit (INDEPENDENT_AMBULATORY_CARE_PROVIDER_SITE_OTHER): Admitting: Family

## 2024-05-29 ENCOUNTER — Encounter: Payer: Self-pay | Admitting: Family

## 2024-05-29 ENCOUNTER — Other Ambulatory Visit: Payer: Self-pay

## 2024-05-29 VITALS — BP 102/66 | HR 94 | Temp 97.6°F | Wt 166.0 lb

## 2024-05-29 DIAGNOSIS — A539 Syphilis, unspecified: Secondary | ICD-10-CM | POA: Diagnosis not present

## 2024-05-29 DIAGNOSIS — Z23 Encounter for immunization: Secondary | ICD-10-CM | POA: Diagnosis not present

## 2024-05-29 DIAGNOSIS — B20 Human immunodeficiency virus [HIV] disease: Secondary | ICD-10-CM

## 2024-05-29 DIAGNOSIS — Z0000 Encounter for general adult medical examination without abnormal findings: Secondary | ICD-10-CM | POA: Diagnosis not present

## 2024-05-29 NOTE — Patient Instructions (Addendum)
Nice to see you.  We will check your lab work today.  Continue to take your medication daily as prescribed.  Plan for follow up in 2 months or sooner if needed with lab work on the same day.  Have a great day and stay safe!

## 2024-05-29 NOTE — Progress Notes (Unsigned)
 Brief Narrative   Patient ID: [name] [name], adult    DOB: Jul 20, 1996, 28 y.o.   MRN: [id number]  [name] [name] is a 28 y/o transgender male diagnosed with HIV disease in March 2013 with risk factor of MSM. Initial viral load was 89,700 with CD4 count of 460. History of cryptosporidium infection and candia. Genosure with M184V (emtricitabine , lamivudine) and R263K. HLAB5701 negative. ART experienced with Complera, Prezcobix , Descovy , Biktarvy  and now Symtuza .   Subjective:   Chief Complaint  Patient presents with   Follow-up    Flu shot; B20, Syphillis    HPI:  [name] [name] is a 28 y.o. adult with HIV disease last seen on 04/19/2024 with poorly controlled virus and less than optimal adherence to Symtuza .  Viral load was 303,000 with CD4 count 137.  Found to be positive for syphilis with titer of 1: 1024 and treated with 14 days of doxycycline  given secondary syphilis.  Kidney function, liver function, electrolytes within normal ranges.  Here today for follow-up.  [name] [name] has been doing okay since her last office visit and is present today with [facility].  She is no longer working at [employer] and is seeking new employment as well as possibly pursuing disability.  Has been taking her Symtuza  as prescribed along with Bactrim  for OI prophylaxis with no adverse side effects.  Housing, transportation, and access to food are stable.  No new concerns/complaints.  Healthcare maintenance reviewed.  Condoms and site-specific STD testing offered.   Denies fevers, chills, night sweats, headaches, changes in vision, neck pain/stiffness, nausea, diarrhea, vomiting, lesions or rashes.  Lab Results  Component Value Date   CD4TCELL 10 (L) 04/19/2024   CD4TABS <35 (L) 10/15/2023   Lab Results  Component Value Date   HIV1RNAQUANT 303,000 (H) 04/19/2024     No Known Allergies    Outpatient Medications Prior to Visit  Medication Sig Dispense Refill   ibuprofen  (ADVIL ) 600 MG tablet Take  1 tablet (600 mg total) by mouth every 6 (six) hours as needed. 30 tablet 0   sulfamethoxazole -trimethoprim  (BACTRIM  DS) 800-160 MG tablet Take 1 tablet by mouth daily. 30 tablet 3   SYMTUZA  800-150-200-10 MG TABS Take 1 tablet by mouth daily. 30 tablet 5   No facility-administered medications prior to visit.     Past Medical History:  Diagnosis Date   Acute kidney failure (HCC) 04/2017   Anal fissure 2017   C. 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 anus (lgsil) 2013   Rectal gonorrhea    multiple episodes   Seizures (HCC) 2017   probably stress related (05/24/2017)   Syphilis    Syphilis, secondary 11/2011   tx'ed with IM PCN x 3     Past Surgical History:  Procedure Laterality Date   ANAL EXAMINATION UNDER ANESTHESIA     INCISION AND DRAINAGE ABSCESS Left 02/16/2019   Procedure: INCISION AND DRAINAGE ABSCESS;  Surgeon: Carlie Clark, MD;  Location: [facility] OR;  Service: ENT;  Laterality: Left;   WISDOM TOOTH EXTRACTION          Review of Systems  Constitutional:  Negative for appetite change, chills,  diaphoresis, fatigue, fever and unexpected weight change.  Eyes:        Negative for acute change in vision  Respiratory:  Negative for chest tightness, shortness of breath and wheezing.   Cardiovascular:  Negative for chest pain.  Gastrointestinal:  Negative for diarrhea, nausea and vomiting.  Genitourinary:  Negative for dysuria, pelvic pain and vaginal discharge.  Musculoskeletal:  Negative for neck pain and neck stiffness.  Skin:  Negative for rash.  Neurological:  Negative for seizures, syncope, weakness and headaches.  Hematological:  Negative for adenopathy. Does  not bruise/bleed easily.  Psychiatric/Behavioral:  Negative for hallucinations.      Objective:   BP 102/66   Pulse 94   Temp 97.6 F (36.4 C) (Oral)   Wt 166 lb (75.3 kg)   SpO2 100%   BMI 18.24 kg/m  Nursing note and vital signs reviewed.  Physical Exam Constitutional:      General: She is not in acute distress.    Appearance: She is well-developed.  Eyes:     Conjunctiva/sclera: Conjunctivae normal.  Cardiovascular:     Rate and Rhythm: Normal rate and regular rhythm.     Heart sounds: Normal heart sounds. No murmur heard.    No friction rub. No gallop.  Pulmonary:     Effort: Pulmonary effort is normal. No respiratory distress.     Breath sounds: Normal breath sounds. No wheezing or rales.  Chest:     Chest wall: No tenderness.  Abdominal:     General: Bowel sounds are normal.     Palpations: Abdomen is soft.     Tenderness: There is no abdominal tenderness.  Musculoskeletal:     Cervical back: Neck supple.  Lymphadenopathy:     Cervical: No cervical adenopathy.  Skin:    General: Skin is warm and dry.     Findings: No rash.  Neurological:     Mental Status: She is alert and oriented to person, place, and time.  Psychiatric:        Behavior: Behavior normal.        Thought Content: Thought content normal.        Judgment: Judgment normal.          01/19/2024   11:15 AM 10/20/2023   11:19 AM 07/10/2023   10:54 AM 05/11/2023   10:33 AM 03/10/2023   10:37 AM  Depression screen PHQ 2/9  Decreased Interest 0 1 0 2 0  Down, Depressed, Hopeless 0 1 0 2 1  PHQ - 2 Score 0 2 0 4 1  Altered sleeping 0 3  3   Tired, decreased energy  2  2   Change in appetite 0 0  3   Feeling bad or failure about yourself  0 1  2   Trouble concentrating 0 0  3   Moving slowly or fidgety/restless 0 0  1   Suicidal thoughts 0 0  3   PHQ-9 Score 0 8  21   Difficult doing work/chores Not difficult at all   Somewhat difficult         01/19/2024   11:16 AM  GAD 7 :  Generalized Anxiety Score  Nervous, Anxious, on Edge 2  Control/stop worrying 2  Worry too much - different things 2  Trouble relaxing 2  Restless 0  Easily annoyed or irritable 2  Afraid - awful might happen 2  Total GAD 7 Score 12  Anxiety Difficulty Not difficult at all     The  ASCVD Risk score (Arnett DK, et al., 2019) failed to calculate for the following reasons:   The 2019 ASCVD risk score is only valid for ages 12 to 85      Assessment & Plan:    Patient Active Problem List   Diagnosis Date Noted   Sore throat 01/19/2024   Anxiety 01/19/2024   Abscess of face 10/15/2023   Facial cellulitis 10/15/2023   Polysubstance use disorder 03/10/2023   Male-to-male transgender person 02/07/2023   Poor social situation 02/07/2023   Syphilis 06/02/2020   Healthcare maintenance 06/02/2020   Axillary abscess 02/13/2020   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, 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   Enteropathogenic Escherichia coli infection 05/26/2017   Acute renal failure (ARF) (HCC) 05/24/2017   Abnormal brain MRI 02/07/2017   Rash 11/15/2016   Hypotension 11/05/2016   Shingles 09/14/2016   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 Breden has improved adherence with good tolerance to Symtuza .  Suspect she is on her way back to being undetectable.  Reviewed previous lab work and discussed plan of care and U equals U.  Remains at risk for opportunistic infection and will need to continue with current dose of Bactrim  for OI prophylaxis.  Working on seeking employment.  Social determinants of health reviewed with no interventions indicated and continues to work with [facility].  Check lab work.  Continue current dose of Symtuza  and Bactrim .  Plan for  follow-up in 2 months or sooner if needed with lab work on the same day.      Relevant Orders   HIV-1 RNA quant-no reflex-bld   T-helper cell (CD4)- ([facility] clinic only)   Syphilis   New positive infection with titer of 1:1024 and s/p treatment with 14 days of doxycycline  (secondary to Bicillin  shortage). Discussed nature of RPR testing. Will wait until next visit to check RPR as it has been about 1 month since completion of treatment.       Healthcare maintenance   Discussed importance of safe sexual practice and condom use. Condoms and site specific STD testing offered.  Vaccinations reviewed and following counseling influenza updated.  Dental care up to date.       Other Visit Diagnoses       Need for influenza vaccination       Relevant Orders   Flu vaccine trivalent PF, 6mos and older(Flulaval,Afluria,Fluarix,Fluzone) (Completed)        I am having [name] L. Waddell Mason maintain her ibuprofen , sulfamethoxazole -trimethoprim , and Symtuza .   Follow-up: Return in about 2 months (around 07/29/2024). or sooner if needed.    Cathlyn July, MSN, FNP-C Nurse Practitioner [facility] Main number: [phone]

## 2024-05-30 ENCOUNTER — Encounter: Payer: Self-pay | Admitting: Family

## 2024-05-30 NOTE — Assessment & Plan Note (Signed)
 New positive infection with titer of 1:1024 and s/p treatment with 14 days of doxycycline  (secondary to Bicillin  shortage). Discussed nature of RPR testing. Will wait until next visit to check RPR as it has been about 1 month since completion of treatment.

## 2024-05-30 NOTE — Assessment & Plan Note (Signed)
 [name] [name] has improved adherence with good tolerance to Symtuza .  Suspect she is on her way back to being undetectable.  Reviewed previous lab work and discussed plan of care and U equals U.  Remains at risk for opportunistic infection and will need to continue with current dose of Bactrim  for OI prophylaxis.  Working on seeking employment.  Social determinants of health reviewed with no interventions indicated and continues to work with [facility].  Check lab work.  Continue current dose of Symtuza  and Bactrim .  Plan for follow-up in 2 months or sooner if needed with lab work on the same day.

## 2024-05-30 NOTE — Assessment & Plan Note (Signed)
 Discussed importance of safe sexual practice and condom use. Condoms and site specific STD testing offered.  Vaccinations reviewed and following counseling influenza updated.  Dental care up to date.

## 2024-05-31 LAB — HIV-1 RNA QUANT-NO REFLEX-BLD
HIV 1 RNA Quant: 10500 {copies}/mL — ABNORMAL HIGH
HIV-1 RNA Quant, Log: 4.02 {Log_copies}/mL — ABNORMAL HIGH

## 2024-05-31 LAB — T-HELPER CELL (CD4) - (RCID CLINIC ONLY)
CD4 % Helper T Cell: 6 % — ABNORMAL LOW (ref 33–65)
CD4 T Cell Abs: 163 /uL — ABNORMAL LOW (ref 400–1790)

## 2024-06-03 ENCOUNTER — Ambulatory Visit: Payer: Self-pay | Admitting: Family

## 2024-07-31 ENCOUNTER — Ambulatory Visit: Admitting: Family

## 2024-08-04 ENCOUNTER — Emergency Department (HOSPITAL_COMMUNITY)
Admission: EM | Admit: 2024-08-04 | Discharge: 2024-08-05 | Disposition: A | Attending: Emergency Medicine | Admitting: Emergency Medicine

## 2024-08-04 ENCOUNTER — Other Ambulatory Visit: Payer: Self-pay

## 2024-08-04 DIAGNOSIS — R112 Nausea with vomiting, unspecified: Secondary | ICD-10-CM | POA: Diagnosis present

## 2024-08-04 DIAGNOSIS — R1013 Epigastric pain: Secondary | ICD-10-CM | POA: Diagnosis not present

## 2024-08-04 DIAGNOSIS — R42 Dizziness and giddiness: Secondary | ICD-10-CM | POA: Diagnosis not present

## 2024-08-04 DIAGNOSIS — R070 Pain in throat: Secondary | ICD-10-CM | POA: Diagnosis not present

## 2024-08-04 MED ORDER — FAMOTIDINE IN NACL 20-0.9 MG/50ML-% IV SOLN
20.0000 mg | Freq: Once | INTRAVENOUS | Status: AC
  Administered 2024-08-04: 20 mg via INTRAVENOUS
  Filled 2024-08-04: qty 50

## 2024-08-04 MED ORDER — DIPHENHYDRAMINE HCL 50 MG/ML IJ SOLN
50.0000 mg | Freq: Once | INTRAMUSCULAR | Status: AC
Start: 2024-08-04 — End: 2024-08-04
  Administered 2024-08-04: 50 mg via INTRAVENOUS
  Filled 2024-08-04: qty 1

## 2024-08-04 MED ORDER — METHYLPREDNISOLONE SODIUM SUCC 125 MG IJ SOLR
125.0000 mg | Freq: Once | INTRAMUSCULAR | Status: AC
  Administered 2024-08-04: 125 mg via INTRAVENOUS
  Filled 2024-08-04: qty 2

## 2024-08-04 NOTE — ED Notes (Signed)
 SOB, tightness in chest and taste like metal; feeling lethargic
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
## 2024-08-05 MED ORDER — ONDANSETRON 4 MG PO TBDP: 4.0000 mg | ORAL_TABLET | Freq: Three times a day (TID) | ORAL | 0 refills | Status: DC | PRN

## 2024-08-05 NOTE — Discharge Instructions (Addendum)
 Take Zofran  as needed as prescribed for nausea and vomiting. Recheck with your primary care provider. Return to the emergency room for worsening or concerning symptoms.

## 2024-08-20 ENCOUNTER — Encounter (HOSPITAL_COMMUNITY): Payer: Self-pay

## 2024-08-20 ENCOUNTER — Other Ambulatory Visit: Payer: Self-pay

## 2024-08-20 ENCOUNTER — Emergency Department (HOSPITAL_COMMUNITY)
Admission: EM | Admit: 2024-08-20 | Discharge: 2024-08-20 | Disposition: A | Discharge status: Home / Self Care | Attending: Emergency Medicine | Admitting: Emergency Medicine

## 2024-08-20 DIAGNOSIS — F15129 Other stimulant abuse with intoxication, unspecified: Secondary | ICD-10-CM | POA: Diagnosis present

## 2024-08-20 DIAGNOSIS — F19929 Other psychoactive substance use, unspecified with intoxication, unspecified: Secondary | ICD-10-CM
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
## 2024-08-20 NOTE — ED Notes (Signed)
 Patient is resting comfortably.
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
## 2024-08-27 ENCOUNTER — Other Ambulatory Visit: Payer: Self-pay

## 2024-08-27 ENCOUNTER — Telehealth: Admitting: Nurse Practitioner

## 2024-08-27 DIAGNOSIS — M7989 Other specified soft tissue disorders: Secondary | ICD-10-CM | POA: Diagnosis not present

## 2024-08-27 DIAGNOSIS — S6991XA Unspecified injury of right wrist, hand and finger(s), initial encounter: Secondary | ICD-10-CM

## 2024-08-27 MED ORDER — IBUPROFEN 800 MG PO TABS
800.0000 mg | ORAL_TABLET | Freq: Three times a day (TID) | ORAL | 0 refills | Status: DC | PRN
  Filled 2024-08-27: qty 90, 30d supply, fill #0
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
## 2024-08-29 ENCOUNTER — Encounter (HOSPITAL_COMMUNITY): Payer: Self-pay | Admitting: *Deleted

## 2024-08-29 ENCOUNTER — Emergency Department (HOSPITAL_COMMUNITY)

## 2024-08-29 ENCOUNTER — Other Ambulatory Visit: Payer: Self-pay

## 2024-08-29 ENCOUNTER — Emergency Department (HOSPITAL_COMMUNITY)
Admission: EM | Admit: 2024-08-29 | Discharge: 2024-08-29 | Discharge status: Against Medical Advice | Attending: Emergency Medicine | Admitting: Emergency Medicine

## 2024-08-29 DIAGNOSIS — Z5321 Procedure and treatment not carried out due to patient leaving prior to being seen by health care provider: Secondary | ICD-10-CM | POA: Insufficient documentation

## 2024-08-29 DIAGNOSIS — M79641 Pain in right hand: Secondary | ICD-10-CM | POA: Insufficient documentation

## 2024-08-29 NOTE — ED Notes (Signed)
 Patient's temp did not read. He stated he walked here.
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
## 2024-08-29 NOTE — ED Notes (Signed)
 Patient states she does not want to stay and will be back tomorrow. Taking her OTF.

## 2024-09-02 ENCOUNTER — Emergency Department (HOSPITAL_COMMUNITY)
Admission: EM | Admit: 2024-09-02 | Discharge: 2024-09-03 | Disposition: A | Attending: Emergency Medicine | Admitting: Emergency Medicine

## 2024-09-02 ENCOUNTER — Other Ambulatory Visit: Payer: Self-pay

## 2024-09-02 DIAGNOSIS — S62366A Nondisplaced fracture of neck of fifth metacarpal bone, right hand, initial encounter for closed fracture: Secondary | ICD-10-CM

## 2024-09-02 MED ORDER — IBUPROFEN 400 MG PO TABS
400.0000 mg | ORAL_TABLET | Freq: Once | ORAL | Status: AC | PRN
  Administered 2024-09-03: 400 mg via ORAL
  Filled 2024-09-02: qty 1
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
## 2024-09-03 ENCOUNTER — Ambulatory Visit: Admitting: Family
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
## 2024-09-03 NOTE — Progress Notes (Addendum)
 Orthopedic Tech Progress Note Patient Details:  [name] [name] Dec 10, 1995 [id number]  Ortho Devices Type of Ortho Device: Ulna gutter splint Ortho Device/Splint Location: rue Ortho Device/Splint Interventions: Ordered, Application, Adjustment  I applied the splint. The patient did well. I did splint care training. Post Interventions Patient Tolerated: Well Instructions Provided: Care of device, Adjustment of device  [name] [name] 09/03/2024, 1:53 AM
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
## 2024-09-05 ENCOUNTER — Other Ambulatory Visit: Payer: Self-pay

## 2024-09-09 ENCOUNTER — Telehealth: Payer: Self-pay

## 2024-09-09 NOTE — Telephone Encounter (Signed)
 Detectable Viral Load Intervention (DVL)  Most recent VL:  HIV 1 RNA Quant  Date Value Ref Range Status  05/29/2024 10,500 (H) NOT DETECTED copies/mL Final  04/19/2024 303,000 (H) copies/mL Final  01/19/2024 <20 DETECTED (A) copies/mL Final    Comment:    HIV-1 RNA was detected below 20 copies/mL. Viral nucleic acid detected below this level cannot be quantified by the assay. .     Last Clinic Visit: 05/29/24  Current ART regimen: Symtuza   Appointment status: patient does not have future appointment scheduled   Medication last dispensed (per chart review):   Medication Adherence   Not able to assess  Barriers to Care   Not able to assess   Interventions   Called patient to discuss medication adherence and possible barriers to care.  Not able to assess at this time. Will send mychart message to schedule appt.  Lorenda CHRISTELLA Code, RMA

## 2024-09-12 ENCOUNTER — Other Ambulatory Visit: Payer: Self-pay

## 2024-09-12 ENCOUNTER — Emergency Department (HOSPITAL_COMMUNITY)
Admission: EM | Admit: 2024-09-12 | Discharge: 2024-09-12 | Disposition: A | Discharge status: Home / Self Care | Attending: Emergency Medicine | Admitting: Emergency Medicine

## 2024-09-12 ENCOUNTER — Encounter (HOSPITAL_COMMUNITY): Payer: Self-pay | Admitting: *Deleted

## 2024-09-12 ENCOUNTER — Emergency Department (HOSPITAL_COMMUNITY)

## 2024-09-12 DIAGNOSIS — S62306S Unspecified fracture of fifth metacarpal bone, right hand, sequela: Secondary | ICD-10-CM | POA: Insufficient documentation

## 2024-09-12 DIAGNOSIS — F17200 Nicotine dependence, unspecified, uncomplicated: Secondary | ICD-10-CM | POA: Insufficient documentation

## 2024-09-12 DIAGNOSIS — S6991XS Unspecified injury of right wrist, hand and finger(s), sequela: Secondary | ICD-10-CM | POA: Diagnosis present
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
## 2024-09-12 NOTE — Progress Notes (Signed)
 Orthopedic Tech Progress Note Patient Details:  [name] [name] 04-18-1996 [id number]  Ortho Devices Type of Ortho Device: Cotton web roll, Ace wrap, Short arm splint Ortho Device/Splint Location: R 5TH METACARPAL Ortho Device/Splint Interventions: Ordered, Application, Adjustment   Post Interventions Patient Tolerated: Well Instructions Provided: Care of device  [name] L Lacreshia Bondarenko 09/12/2024, 5:05 AM

## 2024-09-12 NOTE — Discharge Instructions (Addendum)
 You were placed in a sling this evening.  It is very important that you keep the sling on and dry.  Please call orthopedic surgery at the provided number to schedule a follow-up appointment.  This injury may require operative fixation and it is important that you follow-up with hand surgery for further evaluation.  Return to the emergency department if you develop any life-threatening symptoms.
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
## 2024-09-21 ENCOUNTER — Emergency Department (HOSPITAL_COMMUNITY)

## 2024-09-21 ENCOUNTER — Inpatient Hospital Stay (HOSPITAL_COMMUNITY)
Admission: EM | Admit: 2024-09-21 | Discharge: 2024-09-27 | DRG: 975 | Disposition: A | Discharge status: Home / Self Care | Attending: Family Medicine | Admitting: Family Medicine

## 2024-09-21 ENCOUNTER — Encounter (HOSPITAL_COMMUNITY): Payer: Self-pay

## 2024-09-21 ENCOUNTER — Other Ambulatory Visit: Payer: Self-pay

## 2024-09-21 DIAGNOSIS — Z79899 Other long term (current) drug therapy: Secondary | ICD-10-CM

## 2024-09-21 DIAGNOSIS — F640 Transsexualism: Secondary | ICD-10-CM | POA: Diagnosis present

## 2024-09-21 DIAGNOSIS — Z8249 Family history of ischemic heart disease and other diseases of the circulatory system: Secondary | ICD-10-CM

## 2024-09-21 DIAGNOSIS — E538 Deficiency of other specified B group vitamins: Secondary | ICD-10-CM | POA: Diagnosis present

## 2024-09-21 DIAGNOSIS — A419 Sepsis, unspecified organism: Principal | ICD-10-CM | POA: Diagnosis present

## 2024-09-21 DIAGNOSIS — Z91148 Patient's other noncompliance with medication regimen for other reason: Secondary | ICD-10-CM

## 2024-09-21 DIAGNOSIS — D638 Anemia in other chronic diseases classified elsewhere: Secondary | ICD-10-CM | POA: Diagnosis present

## 2024-09-21 DIAGNOSIS — J188 Other pneumonia, unspecified organism: Secondary | ICD-10-CM | POA: Diagnosis present

## 2024-09-21 DIAGNOSIS — F1510 Other stimulant abuse, uncomplicated: Secondary | ICD-10-CM | POA: Diagnosis present

## 2024-09-21 DIAGNOSIS — B20 Human immunodeficiency virus [HIV] disease: Secondary | ICD-10-CM | POA: Diagnosis present

## 2024-09-21 DIAGNOSIS — Z5900 Homelessness unspecified: Secondary | ICD-10-CM

## 2024-09-21 DIAGNOSIS — E871 Hypo-osmolality and hyponatremia: Secondary | ICD-10-CM | POA: Diagnosis present

## 2024-09-21 DIAGNOSIS — D509 Iron deficiency anemia, unspecified: Secondary | ICD-10-CM | POA: Diagnosis present

## 2024-09-21 DIAGNOSIS — J189 Pneumonia, unspecified organism: Secondary | ICD-10-CM | POA: Diagnosis present

## 2024-09-21 DIAGNOSIS — Z1152 Encounter for screening for COVID-19: Secondary | ICD-10-CM

## 2024-09-21 DIAGNOSIS — B370 Candidal stomatitis: Secondary | ICD-10-CM | POA: Diagnosis present

## 2024-09-21 DIAGNOSIS — F17210 Nicotine dependence, cigarettes, uncomplicated: Secondary | ICD-10-CM | POA: Diagnosis present

## 2024-09-21 DIAGNOSIS — Z7251 High risk heterosexual behavior: Secondary | ICD-10-CM

## 2024-09-21 DIAGNOSIS — R6520 Severe sepsis without septic shock: Principal | ICD-10-CM | POA: Diagnosis present

## 2024-09-21 LAB — CBC
HCT: 31.2 % — ABNORMAL LOW (ref 39.0–52.0)
Hemoglobin: 10.1 g/dL — ABNORMAL LOW (ref 13.0–17.0)
MCH: 30.4 pg (ref 26.0–34.0)
MCHC: 32.4 g/dL (ref 30.0–36.0)
MCV: 94 fL (ref 80.0–100.0)
Platelets: 227 K/uL (ref 150–400)
RBC: 3.32 MIL/uL — ABNORMAL LOW (ref 4.22–5.81)
RDW: 12.5 % (ref 11.5–15.5)
WBC: 7.8 K/uL (ref 4.0–10.5)
nRBC: 0 % (ref 0.0–0.2)

## 2024-09-21 LAB — BASIC METABOLIC PANEL WITH GFR
Anion gap: 9 (ref 5–15)
BUN: 15 mg/dL (ref 6–20)
CO2: 25 mmol/L (ref 22–32)
Calcium: 9 mg/dL (ref 8.9–10.3)
Chloride: 98 mmol/L (ref 98–111)
Creatinine, Ser: 1.22 mg/dL (ref 0.61–1.24)
GFR, Estimated: >60 mL/min
Glucose, Bld: 108 mg/dL — ABNORMAL HIGH (ref 70–99)
Potassium: 3.8 mmol/L (ref 3.5–5.1)
Sodium: 132 mmol/L — ABNORMAL LOW (ref 135–145)

## 2024-09-21 LAB — I-STAT CG4 LACTIC ACID, ED: Lactic Acid, Venous: 1.1 mmol/L (ref 0.5–1.9)

## 2024-09-21 MED ORDER — ACETAMINOPHEN 325 MG PO TABS
650.0000 mg | ORAL_TABLET | Freq: Once | ORAL | Status: AC
  Administered 2024-09-21: 650 mg via ORAL
  Filled 2024-09-21: qty 2

## 2024-09-21 NOTE — ED Triage Notes (Signed)
 Pt reports with pain all over that started 1 hr ago. Pt states that he has shob.

## 2024-09-21 NOTE — ED Provider Triage Note (Signed)
 Emergency Medicine Provider Triage Evaluation Note  [name] [name] , a 28 y.o. adult  was evaluated in triage.  Pt complains of cough, congestion, body aches, NV started an hr ago HIV+ and does not take medications. Last HIV count 10,500 on 05/29/24  Review of Systems  Positive: See hpi Negative:   Physical Exam  There were no vitals taken for this visit. Gen:   Awake, no distress   Resp:  Normal effort  MSK:   Moves extremities without difficulty  Other:    Medical Decision Making  Medically screening exam initiated at 9:38 PM.  Appropriate orders placed.  [name] [name] was informed that the remainder of the evaluation will be completed by another provider, this initial triage assessment does not replace that evaluation, and the importance of remaining in the ED until their evaluation is complete.  Labs, lactic, cultures, cxr ordered Tylenol  for fever   [name] [name], [name] [name] 09/21/24 2140

## 2024-09-22 ENCOUNTER — Encounter (HOSPITAL_COMMUNITY): Payer: Self-pay | Admitting: Family Medicine

## 2024-09-22 DIAGNOSIS — D509 Iron deficiency anemia, unspecified: Secondary | ICD-10-CM | POA: Diagnosis present

## 2024-09-22 DIAGNOSIS — E538 Deficiency of other specified B group vitamins: Secondary | ICD-10-CM | POA: Diagnosis present

## 2024-09-22 DIAGNOSIS — J189 Pneumonia, unspecified organism: Secondary | ICD-10-CM | POA: Diagnosis present

## 2024-09-22 DIAGNOSIS — B20 Human immunodeficiency virus [HIV] disease: Secondary | ICD-10-CM

## 2024-09-22 DIAGNOSIS — Z1152 Encounter for screening for COVID-19: Secondary | ICD-10-CM | POA: Diagnosis not present

## 2024-09-22 DIAGNOSIS — J188 Other pneumonia, unspecified organism: Secondary | ICD-10-CM | POA: Diagnosis present

## 2024-09-22 DIAGNOSIS — F17210 Nicotine dependence, cigarettes, uncomplicated: Secondary | ICD-10-CM | POA: Diagnosis present

## 2024-09-22 DIAGNOSIS — Z91148 Patient's other noncompliance with medication regimen for other reason: Secondary | ICD-10-CM | POA: Diagnosis not present

## 2024-09-22 DIAGNOSIS — Z79899 Other long term (current) drug therapy: Secondary | ICD-10-CM | POA: Diagnosis not present

## 2024-09-22 DIAGNOSIS — Z7251 High risk heterosexual behavior: Secondary | ICD-10-CM | POA: Diagnosis not present

## 2024-09-22 DIAGNOSIS — Z5900 Homelessness unspecified: Secondary | ICD-10-CM | POA: Diagnosis not present

## 2024-09-22 DIAGNOSIS — Z8249 Family history of ischemic heart disease and other diseases of the circulatory system: Secondary | ICD-10-CM | POA: Diagnosis not present

## 2024-09-22 DIAGNOSIS — Q780 Osteogenesis imperfecta: Secondary | ICD-10-CM | POA: Diagnosis not present

## 2024-09-22 DIAGNOSIS — B370 Candidal stomatitis: Secondary | ICD-10-CM | POA: Diagnosis present

## 2024-09-22 DIAGNOSIS — F640 Transsexualism: Secondary | ICD-10-CM | POA: Diagnosis present

## 2024-09-22 DIAGNOSIS — R6520 Severe sepsis without septic shock: Secondary | ICD-10-CM | POA: Diagnosis present

## 2024-09-22 DIAGNOSIS — F1510 Other stimulant abuse, uncomplicated: Secondary | ICD-10-CM | POA: Diagnosis present

## 2024-09-22 DIAGNOSIS — D638 Anemia in other chronic diseases classified elsewhere: Secondary | ICD-10-CM | POA: Diagnosis present

## 2024-09-22 DIAGNOSIS — A419 Sepsis, unspecified organism: Secondary | ICD-10-CM | POA: Diagnosis present

## 2024-09-22 DIAGNOSIS — E871 Hypo-osmolality and hyponatremia: Secondary | ICD-10-CM | POA: Diagnosis present

## 2024-09-22 LAB — URINALYSIS, COMPLETE (UACMP) WITH MICROSCOPIC
Bacteria, UA: NONE SEEN
Bilirubin Urine: NEGATIVE
Glucose, UA: NEGATIVE mg/dL
Hgb urine dipstick: NEGATIVE
Ketones, ur: NEGATIVE mg/dL
Leukocytes,Ua: NEGATIVE
Nitrite: NEGATIVE
Protein, ur: NEGATIVE mg/dL
Specific Gravity, Urine: 1.019 (ref 1.005–1.030)
pH: 5 (ref 5.0–8.0)

## 2024-09-22 LAB — BASIC METABOLIC PANEL WITH GFR
Anion gap: 8 (ref 5–15)
BUN: 17 mg/dL (ref 6–20)
CO2: 24 mmol/L (ref 22–32)
Calcium: 8.2 mg/dL — ABNORMAL LOW (ref 8.9–10.3)
Chloride: 102 mmol/L (ref 98–111)
Creatinine, Ser: 1.14 mg/dL (ref 0.61–1.24)
GFR, Estimated: >60 mL/min
Glucose, Bld: 97 mg/dL (ref 70–99)
Potassium: 3.8 mmol/L (ref 3.5–5.1)
Sodium: 134 mmol/L — ABNORMAL LOW (ref 135–145)

## 2024-09-22 LAB — CBC
HCT: 28.7 % — ABNORMAL LOW (ref 39.0–52.0)
Hemoglobin: 9.3 g/dL — ABNORMAL LOW (ref 13.0–17.0)
MCH: 30.2 pg (ref 26.0–34.0)
MCHC: 32.4 g/dL (ref 30.0–36.0)
MCV: 93.2 fL (ref 80.0–100.0)
Platelets: 199 K/uL (ref 150–400)
RBC: 3.08 MIL/uL — ABNORMAL LOW (ref 4.22–5.81)
RDW: 12.6 % (ref 11.5–15.5)
WBC: 9.6 K/uL (ref 4.0–10.5)
nRBC: 0 % (ref 0.0–0.2)

## 2024-09-22 LAB — RESPIRATORY PANEL BY PCR

## 2024-09-22 LAB — STREP PNEUMONIAE URINARY ANTIGEN: Strep Pneumo Urinary Antigen: NEGATIVE

## 2024-09-22 LAB — MRSA NEXT GEN BY PCR, NASAL: MRSA by PCR Next Gen: NOT DETECTED

## 2024-09-22 LAB — RESP PANEL BY RT-PCR (RSV, FLU A&B, COVID)  RVPGX2
Influenza A by PCR: NEGATIVE
Influenza B by PCR: NEGATIVE
Resp Syncytial Virus by PCR: NEGATIVE
SARS Coronavirus 2 by RT PCR: NEGATIVE

## 2024-09-22 LAB — MAGNESIUM: Magnesium: 1.6 mg/dL — ABNORMAL LOW (ref 1.7–2.4)

## 2024-09-22 MED ORDER — SODIUM CHLORIDE 0.9 % IV SOLN
500.0000 mg | Freq: Once | INTRAVENOUS | Status: AC
  Administered 2024-09-22: 500 mg via INTRAVENOUS
  Filled 2024-09-22: qty 5

## 2024-09-22 MED ORDER — OXYCODONE HCL 5 MG PO TABS
5.0000 mg | ORAL_TABLET | ORAL | Status: DC | PRN
  Administered 2024-09-22: 5 mg via ORAL
  Filled 2024-09-22: qty 1

## 2024-09-22 MED ORDER — SODIUM CHLORIDE 0.9 % IV SOLN: 2.0000 g | Freq: Three times a day (TID) | INTRAVENOUS | Status: DC

## 2024-09-22 MED ORDER — LACTATED RINGERS IV BOLUS
1000.0000 mL | Freq: Once | INTRAVENOUS | Status: AC
  Administered 2024-09-22: 1000 mL via INTRAVENOUS

## 2024-09-22 MED ORDER — MAGNESIUM SULFATE 2 GM/50ML IV SOLN
2.0000 g | Freq: Once | INTRAVENOUS | Status: AC
  Administered 2024-09-22: 2 g via INTRAVENOUS
  Filled 2024-09-22: qty 50

## 2024-09-22 MED ORDER — HYDROMORPHONE HCL 1 MG/ML IJ SOLN: 0.5000 mg | Freq: Once | INTRAMUSCULAR | Status: DC | PRN

## 2024-09-22 MED ORDER — SODIUM CHLORIDE 0.9% FLUSH
3.0000 mL | Freq: Two times a day (BID) | INTRAVENOUS | Status: DC
  Administered 2024-09-22 – 2024-09-24 (×6): 3 mL via INTRAVENOUS

## 2024-09-22 MED ORDER — SODIUM CHLORIDE 0.9 % IV SOLN
2.0000 g | Freq: Once | INTRAVENOUS | Status: AC
  Administered 2024-09-22: 2 g via INTRAVENOUS
  Filled 2024-09-22: qty 20

## 2024-09-22 MED ORDER — LACTATED RINGERS IV SOLN: INTRAVENOUS | Status: AC

## 2024-09-22 MED ORDER — HYDROMORPHONE HCL 1 MG/ML IJ SOLN: 0.5000 mg | INTRAMUSCULAR | Status: DC | PRN

## 2024-09-22 MED ORDER — VANCOMYCIN HCL 1500 MG/300ML IV SOLN
1500.0000 mg | Freq: Once | INTRAVENOUS | Status: DC
  Administered 2024-09-22: 1500 mg via INTRAVENOUS
  Filled 2024-09-22: qty 300

## 2024-09-22 MED ORDER — LACTATED RINGERS IV SOLN: INTRAVENOUS | Status: DC

## 2024-09-22 MED ORDER — SULFAMETHOXAZOLE-TRIMETHOPRIM 400-80 MG/5ML IV SOLN
15.0000 mg/kg/d | Freq: Four times a day (QID) | INTRAVENOUS | Status: DC
  Administered 2024-09-22 – 2024-09-23 (×6): 297.76 mg via INTRAVENOUS
  Filled 2024-09-22 (×5): qty 18.61

## 2024-09-22 MED ORDER — DARUN-COBIC-EMTRICIT-TENOFAF 800-150-200-10 MG PO TABS
1.0000 | ORAL_TABLET | Freq: Every day | ORAL | Status: DC
  Administered 2024-09-22 – 2024-09-27 (×6): 1 via ORAL
  Filled 2024-09-22 (×3): qty 1

## 2024-09-22 MED ORDER — AZITHROMYCIN 500 MG PO TABS
500.0000 mg | ORAL_TABLET | Freq: Every day | ORAL | Status: AC
  Administered 2024-09-23 – 2024-09-26 (×4): 500 mg via ORAL
  Filled 2024-09-22: qty 2

## 2024-09-22 MED ORDER — SENNOSIDES-DOCUSATE SODIUM 8.6-50 MG PO TABS
1.0000 | ORAL_TABLET | Freq: Every evening | ORAL | Status: DC | PRN
  Administered 2024-09-23 – 2024-09-24 (×2): 1 via ORAL

## 2024-09-22 MED ORDER — ACETAMINOPHEN 325 MG PO TABS: 650.0000 mg | ORAL_TABLET | Freq: Four times a day (QID) | ORAL | Status: DC | PRN

## 2024-09-22 MED ORDER — OXYCODONE HCL 5 MG PO TABS: 5.0000 mg | ORAL_TABLET | ORAL | Status: DC | PRN

## 2024-09-22 MED ORDER — ONDANSETRON HCL 4 MG PO TABS: 4.0000 mg | ORAL_TABLET | Freq: Four times a day (QID) | ORAL | Status: DC | PRN

## 2024-09-22 MED ORDER — ONDANSETRON HCL 4 MG/2ML IJ SOLN: 4.0000 mg | Freq: Four times a day (QID) | INTRAMUSCULAR | Status: DC | PRN

## 2024-09-22 MED ORDER — ENOXAPARIN SODIUM 40 MG/0.4ML IJ SOSY
40.0000 mg | PREFILLED_SYRINGE | INTRAMUSCULAR | Status: DC
  Administered 2024-09-22 – 2024-09-26 (×5): 40 mg via SUBCUTANEOUS
  Filled 2024-09-22: qty 0.4

## 2024-09-22 MED ORDER — GUAIFENESIN 100 MG/5ML PO LIQD
5.0000 mL | ORAL | Status: DC | PRN
  Administered 2024-09-22: 5 mL via ORAL
  Filled 2024-09-22: qty 10

## 2024-09-22 MED ORDER — KETOROLAC TROMETHAMINE 15 MG/ML IJ SOLN
15.0000 mg | Freq: Once | INTRAMUSCULAR | Status: AC
  Administered 2024-09-22: 15 mg via INTRAVENOUS
  Filled 2024-09-22: qty 1

## 2024-09-22 MED ORDER — PREDNISONE 20 MG PO TABS
40.0000 mg | ORAL_TABLET | Freq: Two times a day (BID) | ORAL | Status: DC
  Administered 2024-09-22 – 2024-09-23 (×4): 40 mg via ORAL
  Filled 2024-09-22 (×4): qty 2

## 2024-09-22 MED ORDER — ACETAMINOPHEN 650 MG RE SUPP: 650.0000 mg | Freq: Four times a day (QID) | RECTAL | Status: DC | PRN

## 2024-09-22 MED ORDER — LACTATED RINGERS IV BOLUS (SEPSIS)
1000.0000 mL | Freq: Once | INTRAVENOUS | Status: AC
  Administered 2024-09-22: 1000 mL via INTRAVENOUS

## 2024-09-22 MED ORDER — SODIUM CHLORIDE 0.9 % IV SOLN
2.0000 g | INTRAVENOUS | Status: DC
  Administered 2024-09-23: 2 g via INTRAVENOUS

## 2024-09-22 NOTE — ED Notes (Signed)
 This RN called lab for an update on respiratory panel results. Lab states it is still in process and has 10 more minutes to result.

## 2024-09-22 NOTE — Sepsis Progress Note (Signed)
 Following for sepsis monitoring ?

## 2024-09-22 NOTE — Progress Notes (Signed)
 Pharmacy Antibiotic Note  [name] [name] is a 28 y.o. adult admitted on 09/21/2024 with sepsis.  Pharmacy has been consulted for vancomycin  dosing.  Plan: Vancomycin  1500mg  IV x 1 then 1250mg  IV a12h (AUC 503.1, Scr 1.22, TBW) Follow renal function and clinical course     Temp (24hrs), Avg:102.3 F (39.1 C), Min:100.9 F (38.3 C), Max:103.2 F (39.6 C)  Recent Labs  Lab 09/21/24 2201 09/21/24 2204  WBC  --  7.8  CREATININE  --  1.22  LATICACIDVEN 1.1  --     Estimated Creatinine Clearance (by C-G formula based on SCr of 1.22 mg/dL) Male: 15.4 mL/min Male: 99.4 mL/min    Allergies[1]  Antimicrobials this admission: 12/28 CTX x 1 12/28 azith x 1 12/28 vanc >> 12/28 cefepime  >>  Dose adjustments this admission:   Microbiology results: 12/27 BCx:   Thank you for allowing pharmacy to be a part of this patients care. Leeroy Mace RPh 09/22/2024, 2:11 AM     [1] No Known Allergies

## 2024-09-22 NOTE — H&P (Signed)
 " History and Physical    [name] [name] FMW:[id number] DOB: 1996-02-05 DOA: 09/21/2024  PCP: Scarlett Ronal Caldron, NP   Patient coming from: home   Chief Complaint: SOB, malaise   HPI: [name] [name] is a 28 y.o. adult with medical history significant for HIV/AIDS who presents with shortness of breath and general malaise.  Patient reports developing shortness of breath, fatigue, and general malaise yesterday.  They have had a nonproductive cough associated with this.  ED Course: Upon arrival to the ED, patient is found to be febrile 39.6 C and saturating low 90s on room air with normal RR, elevated HR, and stable BP.  Labs are most notable for normal GFR, normal WBC, hemoglobin 10.1, lactic acid 1.1, and negative COVID-19, RSV, and influenza PCR.  Blood cultures were collected in the ED and the patient was given 2 L LR, acetaminophen , Toradol , Rocephin , azithromycin , and vancomycin .  Review of Systems:  All other systems reviewed and apart from HPI, are negative.  Past Medical History:  Diagnosis Date   Acute kidney failure 04/2017   Anal fissure 2017   C. 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 She has a 0.6 pack-year smoking history. She has never used smokeless tobacco. She reports current alcohol use of about 70.0 standard drinks of alcohol per week. She reports that she does not currently use drugs after having used the following drugs: Marijuana, Cocaine, MDMA (Ecstacy), and Methamphetamines. Frequency: 7.00 times per week.  Allergies[1]  Family History  Problem Relation Age of Onset   Hypertension Maternal Grandmother      Prior to Admission medications  Medication Sig Start Date End Date Taking? Authorizing Provider  ibuprofen  (ADVIL ) 600 MG tablet Take 1 tablet (600 mg total) by mouth every 6 (six) hours as needed. 03/06/24   Nivia Colon, PA-C  ibuprofen  (ADVIL ) 800 MG tablet Take 1 tablet (800 mg total) by mouth every 8 (eight) hours as needed for moderate pain (pain score 4-6). 08/27/24   Kennyth Domino, FNP  ondansetron  (ZOFRAN -ODT) 4 MG disintegrating tablet Take 1 tablet (4 mg total) by mouth every 8 (eight) hours as needed for nausea or vomiting. 08/05/24   Beverley Leita LABOR, PA-C  sulfamethoxazole -trimethoprim  (BACTRIM  DS) 800-160 MG tablet Take 1 tablet by mouth daily. 04/19/24   Calone, Gregory D, FNP  SYMTUZA  800-150-200-10 MG TABS Take 1 tablet by mouth daily. 04/19/24   Philemon Cordella BIRCH, FNP    Physical Exam: Vitals:   09/22/24 0020 09/22/24 0045 09/22/24 0115 09/22/24  0130  BP:   (!) 113/51 108/63  Pulse: (!) 110 (!) 112  (!) 109  Resp:    20  Temp:      TempSrc:      SpO2: 95% 100%  100%     Constitutional: NAD, no pallor or diaphoresis   Eyes: PERTLA, lids and conjunctivae normal ENMT: Mucous membranes are moist. Posterior pharynx clear of any exudate or lesions.   Neck: supple, no masses  Respiratory: Labored respirations. Dyspnea with speech. Rales bilaterally.    Cardiovascular: Rate ~120 and regular. No extremity edema.   Abdomen: No tenderness, soft. Bowel sounds active.  Musculoskeletal:  no clubbing / cyanosis. No joint deformity upper and lower extremities.   Skin: no significant rashes, lesions, ulcers. Warm, dry, well-perfused. Neurologic: CN 2-12 grossly intact. Moving all extremities. Alert and oriented.     Labs and Imaging on Admission: I have personally reviewed following labs and imaging studies  CBC: Recent Labs  Lab 09/21/24 2204  WBC 7.8  HGB 10.1*  HCT 31.2*  MCV 94.0  PLT 227   Basic Metabolic Panel: Recent Labs  Lab 09/21/24 2204  NA 132*  K 3.8  CL 98  CO2 25  GLUCOSE 108*  BUN 15  CREATININE 1.22  CALCIUM 9.0   GFR: Estimated Creatinine Clearance (by C-G formula based on SCr of 1.22 mg/dL) Male: 15.4 mL/min Male: 99.4 mL/min Liver Function Tests: No results for input(s): AST, ALT, ALKPHOS, BILITOT, PROT, ALBUMIN in the last 168 hours. No results for input(s): LIPASE, AMYLASE in the last 168 hours. No results for input(s): AMMONIA in the last 168 hours. Coagulation Profile: No results for input(s): INR, PROTIME in the last 168 hours. Cardiac Enzymes: No results for input(s): CKTOTAL, CKMB, CKMBINDEX, TROPONINI in the last 168 hours. BNP (last 3 results) No results for input(s): PROBNP in the last 8760 hours. HbA1C: No results for input(s): HGBA1C in the last 72 hours. CBG: No results for input(s): GLUCAP in the last 168 hours. Lipid Profile: No results for input(s): CHOL, HDL, LDLCALC, TRIG, CHOLHDL, LDLDIRECT in the last 72 hours. Thyroid Function Tests: No results for input(s): TSH, T4TOTAL, FREET4, T3FREE, THYROIDAB in the last 72 hours. Anemia Panel: No results for input(s): VITAMINB12, FOLATE, FERRITIN, TIBC, IRON, RETICCTPCT in the last 72 hours. Urine analysis:    Component Value Date/Time   COLORURINE YELLOW 05/08/2023 0041   APPEARANCEUR CLEAR 05/08/2023 0041   LABSPEC 1.017 05/08/2023 0041   PHURINE 6.0 05/08/2023 0041   GLUCOSEU NEGATIVE  05/08/2023 0041   HGBUR NEGATIVE 05/08/2023 0041   BILIRUBINUR NEGATIVE 05/08/2023 0041   KETONESUR NEGATIVE 05/08/2023 0041   PROTEINUR NEGATIVE 05/08/2023 0041   NITRITE NEGATIVE 05/08/2023 0041   LEUKOCYTESUR TRACE (A) 05/08/2023 0041   Sepsis Labs: @LABRCNTIP (procalcitonin:4,lacticidven:4) ) Recent Results (from the past 240 hours)  Resp panel by RT-PCR (RSV, Flu A&B, Covid) Anterior Nasal Swab     Status: None   Collection Time: 09/21/24  9:53 PM   Specimen: Anterior Nasal Swab  Result Value Ref Range Status   SARS Coronavirus 2 by RT PCR NEGATIVE NEGATIVE Final    Comment: (NOTE) SARS-CoV-2 target nucleic acids are NOT DETECTED.  The SARS-CoV-2 RNA is generally detectable in upper respiratory specimens during the acute phase of infection. The lowest concentration of SARS-CoV-2 viral copies this assay can detect is 138 copies/mL. A negative result does not preclude SARS-Cov-2 infection and should not be used as the sole basis for treatment or other patient management decisions. A  negative result may occur with  improper specimen collection/handling, submission of specimen other than nasopharyngeal swab, presence of viral mutation(s) within the areas targeted by this assay, and inadequate number of viral copies(<138 copies/mL). A negative result must be combined with clinical observations, patient history, and epidemiological information. The expected result is Negative.  Fact Sheet for Patients:  bloggercourse.com  Fact Sheet for Healthcare Providers:  seriousbroker.it  This test is no t yet approved or cleared by the [place]  FDA and  has been authorized for detection and/or diagnosis of SARS-CoV-2 by FDA under an Emergency Use Authorization (EUA). This EUA will remain  in effect (meaning this test can be used) for the duration of the COVID-19 declaration under Section 564(b)(1) of the Act, 21 U.S.C.section  360bbb-3(b)(1), unless the authorization is terminated  or revoked sooner.       Influenza A by PCR NEGATIVE NEGATIVE Final   Influenza B by PCR NEGATIVE NEGATIVE Final    Comment: (NOTE) The Xpert Xpress SARS-CoV-2/FLU/RSV plus assay is intended as an aid in the diagnosis of influenza from Nasopharyngeal swab specimens and should not be used as a sole basis for treatment. Nasal washings and aspirates are unacceptable for Xpert Xpress SARS-CoV-2/FLU/RSV testing.  Fact Sheet for Patients: bloggercourse.com  Fact Sheet for Healthcare Providers: seriousbroker.it  This test is not yet approved or cleared by the [place]  FDA and has been authorized for detection and/or diagnosis of SARS-CoV-2 by FDA under an Emergency Use Authorization (EUA). This EUA will remain in effect (meaning this test can be used) for the duration of the COVID-19 declaration under Section 564(b)(1) of the Act, 21 U.S.C. section 360bbb-3(b)(1), unless the authorization is terminated or revoked.     Resp Syncytial Virus by PCR NEGATIVE NEGATIVE Final    Comment: (NOTE) Fact Sheet for Patients: bloggercourse.com  Fact Sheet for Healthcare Providers: seriousbroker.it  This test is not yet approved or cleared by the [place]  FDA and has been authorized for detection and/or diagnosis of SARS-CoV-2 by FDA under an Emergency Use Authorization (EUA). This EUA will remain in effect (meaning this test can be used) for the duration of the COVID-19 declaration under Section 564(b)(1) of the Act, 21 U.S.C. section 360bbb-3(b)(1), unless the authorization is terminated or revoked.  Performed at [facility], [address] [place], [place] [address]      Radiological Exams on Admission: DG Chest 2 View Result Date: 09/21/2024 EXAM: 2 VIEW(S) XRAY OF THE CHEST 09/21/2024 09:50:02 PM  COMPARISON: None available. CLINICAL HISTORY: shob FINDINGS: LUNGS AND PLEURA: Bibasilar pulmonary airspace infiltrates, left greater than right, in keeping with multifocal infection or aspiration. No pleural effusion. No pneumothorax. HEART AND MEDIASTINUM: No acute abnormality of the cardiac and mediastinal silhouettes. BONES AND SOFT TISSUES: No acute osseous abnormality. IMPRESSION: 1. Bibasilar pulmonary airspace infiltrates, left greater than right, in keeping with multifocal infection or aspiration. Electronically signed by: Dorethia Molt MD 09/21/2024 10:39 PM EST RP Workstation: [id number]    EKG: Independently reviewed. Sinus tachycardia, rate 121.   Assessment/Plan   1. Multifocal pneumonia  - Blood cultures collected in ED and antibiotics started   - Oxygen saturations decreased and supplemental O2 was started in ED  - Culture sputum, check strep pneumo and legionella urine antigens, check fungitell, check MRSA pcr, treat with Rocephin , azithromycin , Bactrim , and glucocorticoids for now    2. HIV/AIDS  - CD4 was 163 and VL 10,500 in September 2025 - Continue Symtuza ; patient has not been taking prophylactic  Bactrim , using treatment-dose for now     DVT prophylaxis: Lovenox   Code Status: Full  Level of Care: Level of care: Progressive Family Communication: None present   Disposition Plan:  Patient is from: Home  Anticipated d/c is to: Home  Anticipated d/c date is: 09/25/24  Patient currently: Pending clinical improvement  Consults called: None  Admission status: Inpatient     Evalene GORMAN Sprinkles, MD [facility]  09/22/2024, 2:45 AM       [1] No Known Allergies  "

## 2024-09-22 NOTE — Progress Notes (Signed)
 "  PROGRESS NOTE  KANAI BERRIOS FMW:[id number] DOB: 24-Mar-1996 DOA: 09/21/2024 PCP: Scarlett Ronal Caldron, NP  HPI/Recap of past 24 hours: [name] [name] is a 28 y.o. transgender male with medical history significant for HIV/AIDS who presents with SOB, cough and general malaise.  In the ED, noted to be febrile, saturating low 90s on room air, tachycardic and stable BP. Labs fairly stable with negative COVID-19, RSV, and influenza PCR.  Chest x-ray showed bibasilar pulmonary airspace infiltrates, left greater than right, likely multifocal infection or aspiration.  Patient admitted for further management.     Today, patient denies any new complaints, met patient eating breakfast.  Denies any further fevers/chills, chest pain, worsening shortness of breath, abdominal pain, nausea/vomiting, diarrhea.    Assessment/Plan: Principal Problem:   Multifocal pneumonia Active Problems:   AIDS (acquired immune deficiency syndrome) (HCC)  Sepsis 2/2 multifocal pneumonia  On admission, noted to be febrile, tachycardic COVID, RSV, influenza PCR, urine strep pneumonia negative, Legionella, sputum culture pending RVP pending Fungitell pending BC x 2 pending UA negative Chest x-ray showed bibasilar pulmonary airspace infiltrates, left greater than right, likely multifocal infection or aspiration Continue Rocephin , azithromycin , Bactrim  Continue prednisone  40 mg twice daily x 5 days (prophylaxis for PCP, as it is lowers the risk of respiratory failure and death and HIV associated PCP)  Anemia of chronic disease Baseline hemoglobin around 12 No obvious signs of bleeding Anemia panel pending Daily CBC  Hypomagnesemia Replace as needed   HIV/AIDS  CD4 was 163 and VL 10,500 in September 2025 Continue Symtuza ; patient has not been taking prophylactic Bactrim , using treatment-dose for now       Estimated body mass index is 19.22 kg/m as calculated from the following:   Height as of 09/12/24: 6' 8  (2.032 m).   Weight as of 09/12/24: 79.4 kg.     Code Status: Full  Family Communication: None at bedside  Disposition Plan: Status is: Inpatient Remains inpatient appropriate because: Level of care      Consultants: None  Procedures: None  Antimicrobials: Ceftriaxone  Azithromycin  Bactrim   DVT prophylaxis: Lovenox    Objective: Vitals:   09/22/24 0630 09/22/24 0811 09/22/24 0845 09/22/24 1201  BP:  112/73 118/74 116/76  Pulse: 90 94 89 85  Resp:  18  18  Temp:  98.7 F (37.1 C)  98.7 F (37.1 C)  TempSrc:      SpO2: 100% 100% 98% 97%    Intake/Output Summary (Last 24 hours) at 09/22/2024 1224 Last data filed at 09/22/2024 9080 Gross per 24 hour  Intake 2908.8 ml  Output --  Net 2908.8 ml   There were no vitals filed for this visit.  Exam: General: NAD  Cardiovascular: S1, S2 present Respiratory: Diminished breath sounds bilaterally Abdomen: Soft, nontender, nondistended, bowel sounds present Musculoskeletal: No bilateral pedal edema noted Skin: Normal Psychiatry: Normal mood     Data Reviewed: CBC: Recent Labs  Lab 09/21/24 2204 09/22/24 0513  WBC 7.8 9.6  HGB 10.1* 9.3*  HCT 31.2* 28.7*  MCV 94.0 93.2  PLT 227 199   Basic Metabolic Panel: Recent Labs  Lab 09/21/24 2204 09/22/24 0513  NA 132* 134*  K 3.8 3.8  CL 98 102  CO2 25 24  GLUCOSE 108* 97  BUN 15 17  CREATININE 1.22 1.14  CALCIUM 9.0 8.2*  MG  --  1.6*   GFR: Estimated Creatinine Clearance (by C-G formula based on SCr of 1.14 mg/dL) Male: 09.5 mL/min Male: 106.4 mL/min Liver Function  Tests: No results for input(s): AST, ALT, ALKPHOS, BILITOT, PROT, ALBUMIN in the last 168 hours. No results for input(s): LIPASE, AMYLASE in the last 168 hours. No results for input(s): AMMONIA in the last 168 hours. Coagulation Profile: No results for input(s): INR, PROTIME in the last 168 hours. Cardiac Enzymes: No results for input(s): CKTOTAL, CKMB,  CKMBINDEX, TROPONINI in the last 168 hours. BNP (last 3 results) No results for input(s): PROBNP in the last 8760 hours. HbA1C: No results for input(s): HGBA1C in the last 72 hours. CBG: No results for input(s): GLUCAP in the last 168 hours. Lipid Profile: No results for input(s): CHOL, HDL, LDLCALC, TRIG, CHOLHDL, LDLDIRECT in the last 72 hours. Thyroid Function Tests: No results for input(s): TSH, T4TOTAL, FREET4, T3FREE, THYROIDAB in the last 72 hours. Anemia Panel: No results for input(s): VITAMINB12, FOLATE, FERRITIN, TIBC, IRON, RETICCTPCT in the last 72 hours. Urine analysis:    Component Value Date/Time   COLORURINE YELLOW 09/22/2024 0732   APPEARANCEUR CLEAR 09/22/2024 0732   LABSPEC 1.019 09/22/2024 0732   PHURINE 5.0 09/22/2024 0732   GLUCOSEU NEGATIVE 09/22/2024 0732   HGBUR NEGATIVE 09/22/2024 0732   BILIRUBINUR NEGATIVE 09/22/2024 0732   KETONESUR NEGATIVE 09/22/2024 0732   PROTEINUR NEGATIVE 09/22/2024 0732   NITRITE NEGATIVE 09/22/2024 0732   LEUKOCYTESUR NEGATIVE 09/22/2024 0732   Sepsis Labs: @LABRCNTIP (procalcitonin:4,lacticidven:4)  ) Recent Results (from the past 240 hours)  Resp panel by RT-PCR (RSV, Flu A&B, Covid) Anterior Nasal Swab     Status: None   Collection Time: 09/21/24  9:53 PM   Specimen: Anterior Nasal Swab  Result Value Ref Range Status   SARS Coronavirus 2 by RT PCR NEGATIVE NEGATIVE Final    Comment: (NOTE) SARS-CoV-2 target nucleic acids are NOT DETECTED.  The SARS-CoV-2 RNA is generally detectable in upper respiratory specimens during the acute phase of infection. The lowest concentration of SARS-CoV-2 viral copies this assay can detect is 138 copies/mL. A negative result does not preclude SARS-Cov-2 infection and should not be used as the sole basis for treatment or other patient management decisions. A negative result may occur with  improper specimen collection/handling, submission of  specimen other than nasopharyngeal swab, presence of viral mutation(s) within the areas targeted by this assay, and inadequate number of viral copies(<138 copies/mL). A negative result must be combined with clinical observations, patient history, and epidemiological information. The expected result is Negative.  Fact Sheet for Patients:  bloggercourse.com  Fact Sheet for Healthcare Providers:  seriousbroker.it  This test is no t yet approved or cleared by the [place]  FDA and  has been authorized for detection and/or diagnosis of SARS-CoV-2 by FDA under an Emergency Use Authorization (EUA). This EUA will remain  in effect (meaning this test can be used) for the duration of the COVID-19 declaration under Section 564(b)(1) of the Act, 21 U.S.C.section 360bbb-3(b)(1), unless the authorization is terminated  or revoked sooner.       Influenza A by PCR NEGATIVE NEGATIVE Final   Influenza B by PCR NEGATIVE NEGATIVE Final    Comment: (NOTE) The Xpert Xpress SARS-CoV-2/FLU/RSV plus assay is intended as an aid in the diagnosis of influenza from Nasopharyngeal swab specimens and should not be used as a sole basis for treatment. Nasal washings and aspirates are unacceptable for Xpert Xpress SARS-CoV-2/FLU/RSV testing.  Fact Sheet for Patients: bloggercourse.com  Fact Sheet for Healthcare Providers: seriousbroker.it  This test is not yet approved or cleared by the [place]  FDA and has been authorized for  detection and/or diagnosis of SARS-CoV-2 by FDA under an Emergency Use Authorization (EUA). This EUA will remain in effect (meaning this test can be used) for the duration of the COVID-19 declaration under Section 564(b)(1) of the Act, 21 U.S.C. section 360bbb-3(b)(1), unless the authorization is terminated or revoked.     Resp Syncytial Virus by PCR NEGATIVE NEGATIVE Final     Comment: (NOTE) Fact Sheet for Patients: bloggercourse.com  Fact Sheet for Healthcare Providers: seriousbroker.it  This test is not yet approved or cleared by the [place]  FDA and has been authorized for detection and/or diagnosis of SARS-CoV-2 by FDA under an Emergency Use Authorization (EUA). This EUA will remain in effect (meaning this test can be used) for the duration of the COVID-19 declaration under Section 564(b)(1) of the Act, 21 U.S.C. section 360bbb-3(b)(1), unless the authorization is terminated or revoked.  Performed at [facility], [address], [place], [place] [address]   MRSA Next Gen by PCR, Nasal     Status: None   Collection Time: 09/22/24  7:59 AM  Result Value Ref Range Status   MRSA by PCR Next Gen NOT DETECTED NOT DETECTED Final    Comment: (NOTE) The GeneXpert MRSA Assay (FDA approved for NASAL specimens only), is one component of a comprehensive MRSA colonization surveillance program. It is not intended to diagnose MRSA infection nor to guide or monitor treatment for MRSA infections. Test performance is not FDA approved in patients less than 66 years old. Performed at [facility], [address] [place], [place] [address]       Studies: DG Chest 2 View Result Date: 09/21/2024 EXAM: 2 VIEW(S) XRAY OF THE CHEST 09/21/2024 09:50:02 PM COMPARISON: None available. CLINICAL HISTORY: shob FINDINGS: LUNGS AND PLEURA: Bibasilar pulmonary airspace infiltrates, left greater than right, in keeping with multifocal infection or aspiration. No pleural effusion. No pneumothorax. HEART AND MEDIASTINUM: No acute abnormality of the cardiac and mediastinal silhouettes. BONES AND SOFT TISSUES: No acute osseous abnormality. IMPRESSION: 1. Bibasilar pulmonary airspace infiltrates, left greater than right, in keeping with multifocal infection or aspiration. Electronically signed  by: Dorethia Molt MD 09/21/2024 10:39 PM EST RP Workstation: [id number]    Scheduled Meds:  [START ON 09/23/2024] azithromycin   500 mg Oral Daily   Darunavir -Cobicistat -Emtricitabine -Tenofovir  Alafenamide  1 tablet Oral Daily   enoxaparin  (LOVENOX ) injection  40 mg Subcutaneous Q24H   predniSONE   40 mg Oral BID   sodium chloride  flush  3 mL Intravenous Q12H    Continuous Infusions:  [START ON 09/23/2024] cefTRIAXone  (ROCEPHIN )  IV     lactated ringers  100 mL/hr at 09/22/24 0726   sulfamethoxazole -trimethoprim  Stopped (09/22/24 0710)     LOS: 0 days     Lebron JINNY Cage, MD [facility]  If 7PM-7AM, please contact night-coverage [email or web address] 09/22/2024, 12:24 PM    "

## 2024-09-22 NOTE — ED Provider Notes (Signed)
 " [place] EMERGENCY DEPARTMENT AT [facility] Provider Note   CSN: [id number] Arrival date & time: 09/21/24  2116     Patient presents with: Generalized Body Aches   [name] [name] is a 28 y.o. adult transgender male with AIDS with  noncompliance with antiretroviral therapy and prophylactic antibiotics who presents with concern for 1 day of fevers, chills, body aches, cough, shortness of breath, and left-sided rib pain.  Cough productive with phlegm.  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   HPI     Prior to Admission medications  Medication Sig Start Date End Date Taking? Authorizing Provider  ibuprofen  (ADVIL ) 600 MG tablet Take 1 tablet (600 mg total) by mouth every 6 (six) hours as needed. 03/06/24   Nivia Colon, PA-C  ibuprofen  (ADVIL ) 800 MG tablet Take 1 tablet (800 mg total) by mouth every 8 (eight) hours as needed for moderate pain (pain score 4-6). 08/27/24   Kennyth Domino, FNP  ondansetron  (ZOFRAN -ODT) 4 MG disintegrating tablet Take 1 tablet (4 mg total) by mouth every 8 (eight) hours as needed for nausea or vomiting. 08/05/24   Beverley Leita LABOR, PA-C  sulfamethoxazole -trimethoprim  (BACTRIM  DS) 800-160 MG tablet Take 1 tablet by mouth daily. 04/19/24   Calone, Gregory D, FNP  SYMTUZA  800-150-200-10 MG TABS Take 1 tablet by mouth daily. 04/19/24   Calone, Gregory D, FNP    Allergies: Patient has no known allergies.    Review of Systems  Constitutional:  Positive for activity change, appetite change, fatigue and fever.  HENT:  Positive for congestion.   Eyes: Negative.   Respiratory:  Positive for cough. Negative for chest tightness and shortness of breath.   Cardiovascular:  Positive for chest pain. Negative for palpitations and leg swelling.  Gastrointestinal: Negative.   Genitourinary: Negative.   Neurological:  Positive for headaches. Negative for light-headedness.    Updated Vital  Signs BP (!) 106/57   Pulse (!) 105   Temp (!) 100.9 F (38.3 C) (Oral)   Resp 20   SpO2 99%   Physical Exam Vitals and nursing note reviewed.  Constitutional:      Appearance: She is ill-appearing. She is not toxic-appearing.  HENT:     Head: Normocephalic and atraumatic.     Mouth/Throat:     Mouth: Mucous membranes are moist.     Pharynx: No oropharyngeal exudate or posterior oropharyngeal erythema.  Eyes:     General:        Right eye: No discharge.        Left eye: No discharge.     Extraocular Movements: Extraocular movements intact.     Conjunctiva/sclera: Conjunctivae normal.     Pupils: Pupils are equal, round, and reactive to light.  Cardiovascular:     Rate and Rhythm: Regular rhythm. Tachycardia present.     Pulses: Normal pulses.     Heart sounds: Normal heart sounds. No murmur heard. Pulmonary:     Effort: Pulmonary effort is normal. No respiratory distress.     Breath sounds: Normal breath sounds. No wheezing or rales.  Abdominal:     General: Bowel sounds are normal. There is no distension.     Palpations: Abdomen is soft.     Tenderness: There is no abdominal tenderness. There is no guarding or rebound.  Musculoskeletal:        General: No deformity.     Cervical back: Neck supple.  Right lower leg: No edema.     Left lower leg: No edema.  Skin:    General: Skin is warm and dry.     Capillary Refill: Capillary refill takes less than 2 seconds.  Neurological:     General: No focal deficit present.     Mental Status: She is alert and oriented to person, place, and time. Mental status is at baseline.  Psychiatric:        Mood and Affect: Mood normal.     (all labs ordered are listed, but only abnormal results are displayed) Labs Reviewed  BASIC METABOLIC PANEL WITH GFR - Abnormal; Notable for the following components:      Result Value   Sodium 132 (*)    Glucose, Bld 108 (*)    All other components within normal limits  CBC - Abnormal;  Notable for the following components:   RBC 3.32 (*)    Hemoglobin 10.1 (*)    HCT 31.2 (*)    All other components within normal limits  RESP PANEL BY RT-PCR (RSV, FLU A&B, COVID)  RVPGX2  CULTURE, BLOOD (ROUTINE X 2)  CULTURE, BLOOD (ROUTINE X 2)  EXPECTORATED SPUTUM ASSESSMENT W GRAM STAIN, RFLX TO RESP C  MRSA NEXT GEN BY PCR, NASAL  BASIC METABOLIC PANEL WITH GFR  MAGNESIUM   CBC  LEGIONELLA PNEUMOPHILA SEROGP 1 UR AG  STREP PNEUMONIAE URINARY ANTIGEN  FUNGITELL BETA-D-GLUCAN  I-STAT CG4 LACTIC ACID, ED    EKG: None  Radiology: DG Chest 2 View Result Date: 09/21/2024 EXAM: 2 VIEW(S) XRAY OF THE CHEST 09/21/2024 09:50:02 PM COMPARISON: None available. CLINICAL HISTORY: shob FINDINGS: LUNGS AND PLEURA: Bibasilar pulmonary airspace infiltrates, left greater than right, in keeping with multifocal infection or aspiration. No pleural effusion. No pneumothorax. HEART AND MEDIASTINUM: No acute abnormality of the cardiac and mediastinal silhouettes. BONES AND SOFT TISSUES: No acute osseous abnormality. IMPRESSION: 1. Bibasilar pulmonary airspace infiltrates, left greater than right, in keeping with multifocal infection or aspiration. Electronically signed by: Dorethia Molt MD 09/21/2024 10:39 PM EST RP Workstation: [id number]     .Critical Care  Performed by: Bobette Pleasant SAUNDERS, PA-C Authorized by: Bobette Pleasant SAUNDERS, PA-C   Critical care provider statement:    Critical care time (minutes):  45   Critical care was time spent personally by me on the following activities:  Development of treatment plan with patient or surrogate, discussions with consultants, evaluation of patient's response to treatment, examination of patient, obtaining history from patient or surrogate, ordering and performing treatments and interventions, ordering and review of laboratory studies, ordering and review of radiographic studies, pulse oximetry and re-evaluation of patient's condition     Medications Ordered in the ED  Darunavir -Cobicistat -Emtricitabine -Tenofovir  Alafenamide (SYMTUZA ) 800-150-200-10 MG TABS 1 tablet (has no administration in time range)  lactated ringers  infusion ( Intravenous New Bag/Given 09/22/24 0314)  enoxaparin  (LOVENOX ) injection 40 mg (has no administration in time range)  sodium chloride  flush (NS) 0.9 % injection 3 mL (3 mLs Intravenous Given 09/22/24 0312)  acetaminophen  (TYLENOL ) tablet 650 mg (has no administration in time range)    Or  acetaminophen  (TYLENOL ) suppository 650 mg (has no administration in time range)  oxyCODONE  (Oxy IR/ROXICODONE ) immediate release tablet 5 mg (has no administration in time range)  HYDROmorphone  (DILAUDID ) injection 0.5 mg (has no administration in time range)  senna-docusate (Senokot-S) tablet 1 tablet (has no administration in time range)  ondansetron  (ZOFRAN ) tablet 4 mg (has no administration in time range)    Or  ondansetron  (ZOFRAN ) injection 4 mg (has no administration in time range)  cefTRIAXone  (ROCEPHIN ) 2 g in sodium chloride  0.9 % 100 mL IVPB (has no administration in time range)  azithromycin  (ZITHROMAX ) tablet 500 mg (has no administration in time range)  sulfamethoxazole -trimethoprim  (BACTRIM ) 297.76 mg of trimethoprim  in dextrose  5 % 500 mL IVPB (has no administration in time range)  guaiFENesin  (ROBITUSSIN) 100 MG/5ML liquid 5 mL (5 mLs Oral Given 09/22/24 0311)  predniSONE  (DELTASONE ) tablet 40 mg (40 mg Oral Given 09/22/24 0311)  acetaminophen  (TYLENOL ) tablet 650 mg (650 mg Oral Given 09/21/24 2200)  lactated ringers  bolus 1,000 mL (0 mLs Intravenous Stopped 09/22/24 0151)  cefTRIAXone  (ROCEPHIN ) 2 g in sodium chloride  0.9 % 100 mL IVPB (0 g Intravenous Stopped 09/22/24 0053)  azithromycin  (ZITHROMAX ) 500 mg in sodium chloride  0.9 % 250 mL IVPB (0 mg Intravenous Stopped 09/22/24 0152)  lactated ringers  bolus 1,000 mL (0 mLs Intravenous Stopped 09/22/24 0312)  ketorolac  (TORADOL ) 15 MG/ML  injection 15 mg (15 mg Intravenous Given 09/22/24 0159)    Clinical Course as of 09/22/24 0356  Sun Sep 22, 2024  0148 Noncompliant with symtuza  and prophylactic bactrim . Initially treated with CAP coverage given hx / likelihood of possible PCP PNA, however per recommendations of pharmacist Leeroy, will broaden with vanc. She also recommends transition to cefepime  inpatient.  [RS]  0213 Consult to Dr. Charlton, hospitalist, who is amenable to plan for admission at this time. I appreciate his collaboration in the care of this patient.  [RS]    Clinical Course User Index [RS] Alnita Aybar, Pleasant SAUNDERS, PA-C                                 Medical Decision Making 28 year old transgender male who presents with concern for cough, fevers, body aches. Administered Patient febrile, tachycardic on intake Tylenol  with improvement in fever but with persistent tachycardia.  Additionally patient found to be hypoxic to the low 90s on room air increased work of breathing at time of my evaluation.  Placed on 2 L by this provider.   DDx includes but limited to pneumonia, PE, pleural effusion, pneumothorax, bronchitis.   Amount and/or Complexity of Data Reviewed Labs: ordered.    Details:  CBC without leukocytosis but with mild anemia with hemoglobin of 10 decrease in patient's baseline of 12.  BMP with hyponatremia 132, otherwise unremarkable.  Blood cultures pending.  Lactic acid is normal.  RVP is negative.   Radiology: ordered.    Details: Chest x-ray with bibasilar pulmonary infiltrates. ECG/medicine tests:     Details:   EKG with sinus tachycardia.    Risk OTC drugs. Prescription drug management. Decision regarding hospitalization.   Patient initiated on CAP coverage with Rocephin  and azithromycin .  Consult to hospitalist as above who is amenable to admit this patient to her service.  I appreciate his collaboration in thecare of this patient. LeShea  voiced understanding of her medical evaluation  and treatment plan.  This chart was dictated using voice recognition software, Dragon. Despite the best efforts of this provider to proofread and correct errors, errors may still occur which can change documentation meaning.      Final diagnoses:  Sepsis with acute hypoxic respiratory failure without septic shock, due to unspecified organism [facility])    ED Discharge Orders     None          Bobette Pleasant SAUNDERS, PA-C 09/22/24 9643  Griselda Norris, MD 09/22/24 [phone]  "

## 2024-09-22 NOTE — ED Notes (Signed)
 Lab called about respiratory panel results not being in system yet. Reports the machine showed invalid result so they have to run test again.

## 2024-09-23 ENCOUNTER — Inpatient Hospital Stay (HOSPITAL_COMMUNITY)

## 2024-09-23 DIAGNOSIS — F17210 Nicotine dependence, cigarettes, uncomplicated: Secondary | ICD-10-CM

## 2024-09-23 DIAGNOSIS — Q780 Osteogenesis imperfecta: Secondary | ICD-10-CM | POA: Diagnosis not present

## 2024-09-23 DIAGNOSIS — J189 Pneumonia, unspecified organism: Secondary | ICD-10-CM

## 2024-09-23 DIAGNOSIS — J188 Other pneumonia, unspecified organism: Secondary | ICD-10-CM | POA: Diagnosis not present

## 2024-09-23 DIAGNOSIS — B20 Human immunodeficiency virus [HIV] disease: Secondary | ICD-10-CM | POA: Diagnosis not present

## 2024-09-23 LAB — URINE DRUG SCREEN
Amphetamines: POSITIVE — AB
Barbiturates: NEGATIVE
Benzodiazepines: NEGATIVE
Cocaine: NEGATIVE
Fentanyl: NEGATIVE
Methadone Scn, Ur: NEGATIVE
Opiates: NEGATIVE
Tetrahydrocannabinol: NEGATIVE

## 2024-09-23 LAB — BASIC METABOLIC PANEL WITH GFR
Anion gap: 11 (ref 5–15)
BUN: 16 mg/dL (ref 6–20)
CO2: 21 mmol/L — ABNORMAL LOW (ref 22–32)
Calcium: 9 mg/dL (ref 8.9–10.3)
Chloride: 102 mmol/L (ref 98–111)
Creatinine, Ser: 1.24 mg/dL (ref 0.61–1.24)
GFR, Estimated: >60 mL/min
Glucose, Bld: 140 mg/dL — ABNORMAL HIGH (ref 70–99)
Potassium: 4.8 mmol/L (ref 3.5–5.1)
Sodium: 134 mmol/L — ABNORMAL LOW (ref 135–145)

## 2024-09-23 LAB — IRON AND TIBC
Iron: 38 ug/dL — ABNORMAL LOW (ref 45–182)
Saturation Ratios: 14 % — ABNORMAL LOW (ref 17.9–39.5)
TIBC: 262 ug/dL (ref 250–450)
UIBC: 224 ug/dL

## 2024-09-23 LAB — MAGNESIUM: Magnesium: 2.7 mg/dL — ABNORMAL HIGH (ref 1.7–2.4)

## 2024-09-23 LAB — CBC
HCT: 30.6 % — ABNORMAL LOW (ref 39.0–52.0)
Hemoglobin: 10 g/dL — ABNORMAL LOW (ref 13.0–17.0)
MCH: 30.4 pg (ref 26.0–34.0)
MCHC: 32.7 g/dL (ref 30.0–36.0)
MCV: 93 fL (ref 80.0–100.0)
Platelets: 252 K/uL (ref 150–400)
RBC: 3.29 MIL/uL — ABNORMAL LOW (ref 4.22–5.81)
RDW: 12.7 % (ref 11.5–15.5)
WBC: 12.6 K/uL — ABNORMAL HIGH (ref 4.0–10.5)
nRBC: 0 % (ref 0.0–0.2)

## 2024-09-23 LAB — LACTATE DEHYDROGENASE: LDH: 163 U/L (ref 105–235)

## 2024-09-23 LAB — FERRITIN: Ferritin: 259 ng/mL (ref 24–336)

## 2024-09-23 LAB — FOLATE: Folate: 6.6 ng/mL

## 2024-09-23 LAB — VITAMIN B12: Vitamin B-12: 306 pg/mL (ref 180–914)

## 2024-09-23 MED ORDER — VITAMIN B-12 1000 MCG PO TABS
1000.0000 ug | ORAL_TABLET | Freq: Every day | ORAL | Status: DC
  Administered 2024-09-23 – 2024-09-27 (×5): 1000 ug via ORAL
  Filled 2024-09-23 (×5): qty 1

## 2024-09-23 MED ORDER — IOHEXOL 350 MG/ML SOLN
75.0000 mL | Freq: Once | INTRAVENOUS | Status: AC | PRN
  Administered 2024-09-23: 75 mL via INTRAVENOUS

## 2024-09-23 MED ORDER — SULFAMETHOXAZOLE-TRIMETHOPRIM 400-80 MG PO TABS
1.0000 | ORAL_TABLET | Freq: Every day | ORAL | Status: DC
  Administered 2024-09-24 – 2024-09-27 (×4): 1 via ORAL
  Filled 2024-09-23 (×4): qty 1

## 2024-09-23 MED ORDER — FERROUS SULFATE 325 (65 FE) MG PO TABS
325.0000 mg | ORAL_TABLET | Freq: Every day | ORAL | Status: DC
  Administered 2024-09-24 – 2024-09-27 (×4): 325 mg via ORAL
  Filled 2024-09-23 (×4): qty 1

## 2024-09-23 NOTE — Consult Note (Addendum)
 "        [facility]    Date of Admission:  09/21/2024     Reason for Consult: pna, sepsis    Referring Provider: Donnamarie     Lines:  peripheral  Abx: 12/27-c ceftriaxone  12/27-c azith 12/27-c bactrim  pjp tx dose  Other: 12/27-c prednisone         Assessment: 28 y.o. adult noncompliant with hiv tx, aids criteria, iv drug use (meth), admitted with 1 day acute onset left sided chest pain and malaise/dyspnea   #hiv Lab Results  Component Value Date   HIV1RNAQUANT 10,500 (H) 05/29/2024   Lab Results  Component Value Date   CD4TCELL 6 (L) 05/29/2024   CD4TABS 163 (L) 05/29/2024  Sees Greg normally at [facility] Noncompliance due to not going to [facility] but seems to at one point in 2018 controlled [place] he is willing to go although did mention busy couldn't go to clinic previously Will try to do one more time initiation here and provide 30 day supply on hand.    #oi Repeat cd4 here Change bactrim  to pcp prophy dosing   #pleuritic chest pain #pna Need to R/o pe given smoking hx and acute onset pleuritic chest pain  Continue cap tx Low suspicion for pjp    #hcm Rpr & hepatitis testing    Plan: Rpr, hep b/c, cd4, hiv viral load tomorrow Stop bactrim /prednisone  Continue ceftriaxone /azith Agree restart symtuza  outpatient ART Resume bactrim  pjp prophy dosing 1 ss tab daily Please provide 1 month supply on hand ART  Maintain standard isolation precaution Discussed with primary team     ------------------------------------------------ Principal Problem:   Multifocal pneumonia Active Problems:   AIDS (acquired immune deficiency syndrome) (HCC)    HPI: [name] [name] is a 29 y.o. adult noncompliant with hiv tx, aids criteria, iv drug use (meth), admitted with 1 day acute onset left sided chest pain and malaise/dyspnea  Patient used iv meth the day prior to sx onset Smoker No hx blood clot  Hasn't been taking his hiv meds as  he didn't have time to come get his meds at [facility]  Other sx at onset include headache Coughing minimally productive of brown sputum  No other focal pain besides left pleuritic chest pain and headache the latter had resolved  On presentation: Febrile to 103 Wbc 9.6 Cxr bilateral lower lobes left >right opacity Started on cap coverage along with prednisone /bactrim  for pjp  Bcx ngtd Feeling a little better   On room air  Restarted on symtuza     Family History  Problem Relation Age of Onset   Hypertension Maternal Grandmother     Social History[1]  Allergies[2]  Review of Systems: ROS All Other ROS was negative, except mentioned above   Past Medical History:  Diagnosis Date   Acute kidney failure 04/2017   Anal fissure 2017   C. 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   Genital HSV 2017   Hepatitis B immune    HIV disease (HCC)    dx'ed in 2013   Immune deficiency disorder    Low grade squamous intraepith lesion on cytologic smear anus (lgsil) 2013   Rectal gonorrhea    multiple episodes   Seizures (HCC) 2017   probably stress related (05/24/2017)   Syphilis    Syphilis, secondary 11/2011   tx'ed with IM PCN x  3       Scheduled Meds:  azithromycin   500 mg Oral Daily   vitamin B-12  1,000 mcg Oral Daily   Darunavir -Cobicistat -Emtricitabine -Tenofovir  Alafenamide  1 tablet Oral Daily   enoxaparin  (LOVENOX ) injection  40 mg Subcutaneous Q24H   [START ON 09/24/2024] ferrous sulfate   325 mg Oral Q breakfast   sodium chloride  flush  3 mL Intravenous Q12H   Continuous Infusions:  cefTRIAXone  (ROCEPHIN )  IV     PRN Meds:.acetaminophen  **OR** acetaminophen , guaiFENesin , HYDROmorphone  (DILAUDID ) injection, ondansetron  **OR** ondansetron  (ZOFRAN ) IV, oxyCODONE , senna-docusate   OBJECTIVE: Blood pressure 111/75, pulse 83,  temperature 98.1 F (36.7 C), temperature source Oral, resp. rate 19, height 6' 8 (2.032 m), weight 82.7 kg, SpO2 98%.  Physical Exam  General/constitutional: no distress, pleasant HEENT: Normocephalic, PER, Conj Clear, EOMI, Oropharynx clear Neck supple CV: rrr no mrg Lungs: clear to auscultation, normal respiratory effort Abd: Soft, Nontender Ext: no edema Skin: No Rash Neuro: nonfocal MSK: no peripheral joint swelling/tenderness/warmth; back spines nontender      Lab Results Lab Results  Component Value Date   WBC 12.6 (H) 09/23/2024   HGB 10.0 (L) 09/23/2024   HCT 30.6 (L) 09/23/2024   MCV 93.0 09/23/2024   PLT 252 09/23/2024    Lab Results  Component Value Date   CREATININE 1.24 09/23/2024   BUN 16 09/23/2024   NA 134 (L) 09/23/2024   K 4.8 09/23/2024   CL 102 09/23/2024   CO2 21 (L) 09/23/2024    Lab Results  Component Value Date   ALT 7 (L) 04/19/2024   AST 13 04/19/2024   ALKPHOS 74 10/15/2023   BILITOT 0.4 04/19/2024      Microbiology: Recent Results (from the past 240 hours)  Blood culture (routine x 2)     Status: None (Preliminary result)   Collection Time: 09/21/24  2:55 AM   Specimen: BLOOD  Result Value Ref Range Status   Specimen Description   Final    BLOOD BLOOD RIGHT FOREARM Performed at [facility], [address] [place], [place] [address]    Special Requests   Final    BOTTLES DRAWN AEROBIC ONLY Blood Culture results may not be optimal due to an inadequate volume of blood received in culture bottles Performed at [facility], [address] [place], [place] [address]    Culture   Final    NO GROWTH < 24 HOURS Performed at [facility], [address], [place], [place] [address]    Report Status PENDING  Incomplete  Resp panel by RT-PCR (RSV, Flu A&B, Covid) Anterior Nasal Swab     Status: None   Collection Time: 09/21/24  9:53 PM   Specimen: Anterior Nasal Swab  Result Value Ref  Range Status   SARS Coronavirus 2 by RT PCR NEGATIVE NEGATIVE Final    Comment: (NOTE) SARS-CoV-2 target nucleic acids are NOT DETECTED.  The SARS-CoV-2 RNA is generally detectable in upper respiratory specimens during the acute phase of infection. The lowest concentration of SARS-CoV-2 viral copies this assay can detect is 138 copies/mL. A negative result does not preclude SARS-Cov-2 infection and should not be used as the sole basis for treatment or other patient management decisions. A negative result may occur with  improper specimen collection/handling, submission of specimen other than nasopharyngeal swab, presence of viral mutation(s) within the areas targeted by this assay, and inadequate number of viral copies(<138 copies/mL). A negative result must be combined with clinical observations,  patient history, and epidemiological information. The expected result is Negative.  Fact Sheet for Patients:  bloggercourse.com  Fact Sheet for Healthcare Providers:  seriousbroker.it  This test is no t yet approved or cleared by the [place]  FDA and  has been authorized for detection and/or diagnosis of SARS-CoV-2 by FDA under an Emergency Use Authorization (EUA). This EUA will remain  in effect (meaning this test can be used) for the duration of the COVID-19 declaration under Section 564(b)(1) of the Act, 21 U.S.C.section 360bbb-3(b)(1), unless the authorization is terminated  or revoked sooner.       Influenza A by PCR NEGATIVE NEGATIVE Final   Influenza B by PCR NEGATIVE NEGATIVE Final    Comment: (NOTE) The Xpert Xpress SARS-CoV-2/FLU/RSV plus assay is intended as an aid in the diagnosis of influenza from Nasopharyngeal swab specimens and should not be used as a sole basis for treatment. Nasal washings and aspirates are unacceptable for Xpert Xpress SARS-CoV-2/FLU/RSV testing.  Fact Sheet for  Patients: bloggercourse.com  Fact Sheet for Healthcare Providers: seriousbroker.it  This test is not yet approved or cleared by the [place]  FDA and has been authorized for detection and/or diagnosis of SARS-CoV-2 by FDA under an Emergency Use Authorization (EUA). This EUA will remain in effect (meaning this test can be used) for the duration of the COVID-19 declaration under Section 564(b)(1) of the Act, 21 U.S.C. section 360bbb-3(b)(1), unless the authorization is terminated or revoked.     Resp Syncytial Virus by PCR NEGATIVE NEGATIVE Final    Comment: (NOTE) Fact Sheet for Patients: bloggercourse.com  Fact Sheet for Healthcare Providers: seriousbroker.it  This test is not yet approved or cleared by the [place]  FDA and has been authorized for detection and/or diagnosis of SARS-CoV-2 by FDA under an Emergency Use Authorization (EUA). This EUA will remain in effect (meaning this test can be used) for the duration of the COVID-19 declaration under Section 564(b)(1) of the Act, 21 U.S.C. section 360bbb-3(b)(1), unless the authorization is terminated or revoked.  Performed at [facility], [address] [place], [place] [address]   Respiratory (~20 pathogens) panel by PCR     Status: None   Collection Time: 09/22/24  7:59 AM   Specimen: Nasal Mucosa; Respiratory  Result Value Ref Range Status   Adenovirus NOT DETECTED NOT DETECTED Final   Coronavirus 229E NOT DETECTED NOT DETECTED Final    Comment: (NOTE) The Coronavirus on the Respiratory Panel, DOES NOT test for the novel  Coronavirus (2019 nCoV)    Coronavirus HKU1 NOT DETECTED NOT DETECTED Final   Coronavirus NL63 NOT DETECTED NOT DETECTED Final   Coronavirus OC43 NOT DETECTED NOT DETECTED Final   Metapneumovirus NOT DETECTED NOT DETECTED Final   Rhinovirus / Enterovirus NOT DETECTED NOT  DETECTED Final   Influenza A NOT DETECTED NOT DETECTED Final   Influenza B NOT DETECTED NOT DETECTED Final   Parainfluenza Virus 1 NOT DETECTED NOT DETECTED Final   Parainfluenza Virus 2 NOT DETECTED NOT DETECTED Final   Parainfluenza Virus 3 NOT DETECTED NOT DETECTED Final   Parainfluenza Virus 4 NOT DETECTED NOT DETECTED Final   Respiratory Syncytial Virus NOT DETECTED NOT DETECTED Final   Bordetella pertussis NOT DETECTED NOT DETECTED Final   Bordetella Parapertussis NOT DETECTED NOT DETECTED Final   Chlamydophila pneumoniae NOT DETECTED NOT DETECTED Final   Mycoplasma pneumoniae NOT DETECTED NOT DETECTED Final    Comment: Performed at [facility], [address] [place], [place] [address]  MRSA Next Gen by PCR, Nasal     Status: None   Collection Time: 09/22/24  7:59 AM  Result Value Ref Range Status   MRSA by PCR Next Gen NOT DETECTED NOT DETECTED Final    Comment: (NOTE) The GeneXpert MRSA Assay (FDA approved for NASAL specimens only), is one component of a comprehensive MRSA colonization surveillance program. It is not intended to diagnose MRSA infection nor to guide or monitor treatment for MRSA infections. Test performance is not FDA approved in patients less than 53 years old. Performed at [facility], [address], [place], [place] [address]      Serology:    Imaging: If present, new imagings (plain films, ct scans, and mri) have been personally visualized and interpreted; radiology reports have been reviewed. Decision making incorporated into the Impression / Recommendations.  12/27 cxr 1. Bibasilar pulmonary airspace infiltrates, left greater than right, in keeping with multifocal infection or aspiration.   12/29 chest ct pending   Constance ONEIDA Passer, MD [facility] [phone] pager    09/23/2024, 7:13 PM     [1]  Social History Tobacco Use   Smoking status: Every Day     Current packs/day: 0.10    Average packs/day: 0.1 packs/day for 6.0 years (0.6 ttl pk-yrs)    Types: Cigarettes   Smokeless tobacco: Never   Tobacco comments:    1 pack per week   Vaping Use   Vaping status: Never Used  Substance Use Topics   Alcohol use: Yes    Alcohol/week: 70.0 standard drinks of alcohol    Types: 70 Shots of liquor per week    Comment: socially   Drug use: Not Currently    Frequency: 7.0 times per week    Types: Marijuana, Cocaine, MDMA (Ecstacy), Methamphetamines    Comment: cocaine, new uses meth more than 7x/week (01/24/2024) - no IVDU  [2] No Known Allergies  "

## 2024-09-23 NOTE — Progress Notes (Signed)
 "  PROGRESS NOTE  [name] [name] FMW:[id number] DOB: Mar 07, 1996 DOA: 09/21/2024 PCP: Scarlett Ronal Caldron, NP  HPI/Recap of past 24 hours: [name] [name] is a 28 y.o. transgender male with medical history significant for HIV/AIDS who presents with SOB, cough and general malaise.  In the ED, noted to be febrile, saturating low 90s on room air, tachycardic and stable BP. Labs fairly stable with negative COVID-19, RSV, and influenza PCR.  Chest x-ray showed bibasilar pulmonary airspace infiltrates, left greater than right, likely multifocal infection or aspiration.  Patient admitted for further management.     Today, patient still complaining of pleuritic left-sided chest pain    Assessment/Plan: Principal Problem:   Multifocal pneumonia Active Problems:   AIDS (acquired immune deficiency syndrome) (HCC)  Sepsis 2/2 multifocal pneumonia  On admission, noted to be febrile, tachycardic Currently afebrile, with leukocytosis (on prednisone ) COVID, RSV, influenza PCR, urine strep pneumonia negative, Legionella, sputum culture pending RVP negative Fungitell pending BC x 2 pending UA negative Chest x-ray showed bibasilar pulmonary airspace infiltrates, left greater than right, likely multifocal infection or aspiration CTA chest pending Continue Rocephin , azithromycin , stop Bactrim  ID on board, recommend stopping Bactrim  and prednisone   Anemia of chronic disease Iron deficiency anemia Vitamin B12 deficiency Baseline hemoglobin around 12 No obvious signs of bleeding Anemia panel showed iron 38, sats 14, vitamin B12 306 Start Iron and vitamin B12 supplement Daily CBC  Hypomagnesemia Replace as needed   HIV/AIDS  CD4 was 163 and VL 10,500 in September 2025 Continue Symtuza ; patient has not been taking prophylactic Bactrim , using treatment-dose for now    Homelessness TOC consulted     Estimated body mass index is 20.04 kg/m as calculated from the following:   Height as of this  encounter: 6' 8 (2.032 m).   Weight as of this encounter: 82.7 kg.     Code Status: Full  Family Communication: None at bedside  Disposition Plan: Status is: Inpatient Remains inpatient appropriate because: Level of care      Consultants: None  Procedures: None  Antimicrobials: Ceftriaxone  Azithromycin   DVT prophylaxis: Lovenox    Objective: Vitals:   09/22/24 1531 09/22/24 1947 09/23/24 0512 09/23/24 1342  BP: 121/79 128/65 123/79 111/75  Pulse: 83 (!) 101 73 83  Resp: 16  20 19   Temp: 98 F (36.7 C) 98.7 F (37.1 C) 97.8 F (36.6 C) 98.1 F (36.7 C)  TempSrc: Oral Oral Oral Oral  SpO2: 100% 95% 97% 98%  Weight:      Height:        Intake/Output Summary (Last 24 hours) at 09/23/2024 1756 Last data filed at 09/23/2024 1340 Gross per 24 hour  Intake 2556.58 ml  Output 3650 ml  Net -1093.42 ml   Filed Weights   09/22/24 1530  Weight: 82.7 kg    Exam: General: NAD  Cardiovascular: S1, S2 present Respiratory: Diminished breath sounds bilaterally Abdomen: Soft, nontender, nondistended, bowel sounds present Musculoskeletal: No bilateral pedal edema noted Skin: Normal Psychiatry: Normal mood     Data Reviewed: CBC: Recent Labs  Lab 09/21/24 2204 09/22/24 0513 09/23/24 0526  WBC 7.8 9.6 12.6*  HGB 10.1* 9.3* 10.0*  HCT 31.2* 28.7* 30.6*  MCV 94.0 93.2 93.0  PLT 227 199 252   Basic Metabolic Panel: Recent Labs  Lab 09/21/24 2204 09/22/24 0513 09/23/24 0526  NA 132* 134* 134*  K 3.8 3.8 4.8  CL 98 102 102  CO2 25 24 21*  GLUCOSE 108* 97 140*  BUN 15 17  16  CREATININE 1.22 1.14 1.24  CALCIUM 9.0 8.2* 9.0  MG  --  1.6* 2.7*   GFR: Estimated Creatinine Clearance (by C-G formula based on SCr of 1.24 mg/dL) Male: 13.3 mL/min Male: 101.9 mL/min Liver Function Tests: No results for input(s): AST, ALT, ALKPHOS, BILITOT, PROT, ALBUMIN in the last 168 hours. No results for input(s): LIPASE, AMYLASE in the last 168  hours. No results for input(s): AMMONIA in the last 168 hours. Coagulation Profile: No results for input(s): INR, PROTIME in the last 168 hours. Cardiac Enzymes: No results for input(s): CKTOTAL, CKMB, CKMBINDEX, TROPONINI in the last 168 hours. BNP (last 3 results) No results for input(s): PROBNP in the last 8760 hours. HbA1C: No results for input(s): HGBA1C in the last 72 hours. CBG: No results for input(s): GLUCAP in the last 168 hours. Lipid Profile: No results for input(s): CHOL, HDL, LDLCALC, TRIG, CHOLHDL, LDLDIRECT in the last 72 hours. Thyroid Function Tests: No results for input(s): TSH, T4TOTAL, FREET4, T3FREE, THYROIDAB in the last 72 hours. Anemia Panel: Recent Labs    09/23/24 0526  VITAMINB12 306  FOLATE 6.6  FERRITIN 259  TIBC 262  IRON 38*   Urine analysis:    Component Value Date/Time   COLORURINE YELLOW 09/22/2024 0732   APPEARANCEUR CLEAR 09/22/2024 0732   LABSPEC 1.019 09/22/2024 0732   PHURINE 5.0 09/22/2024 0732   GLUCOSEU NEGATIVE 09/22/2024 0732   HGBUR NEGATIVE 09/22/2024 0732   BILIRUBINUR NEGATIVE 09/22/2024 0732   KETONESUR NEGATIVE 09/22/2024 0732   PROTEINUR NEGATIVE 09/22/2024 0732   NITRITE NEGATIVE 09/22/2024 0732   LEUKOCYTESUR NEGATIVE 09/22/2024 0732   Sepsis Labs: @LABRCNTIP (procalcitonin:4,lacticidven:4)  ) Recent Results (from the past 240 hours)  Blood culture (routine x 2)     Status: None (Preliminary result)   Collection Time: 09/21/24  2:55 AM   Specimen: BLOOD  Result Value Ref Range Status   Specimen Description   Final    BLOOD BLOOD RIGHT FOREARM Performed at [facility], [address], [place], [place] [address]    Special Requests   Final    BOTTLES DRAWN AEROBIC ONLY Blood Culture results may not be optimal due to an inadequate volume of blood received in culture bottles Performed at [facility], [address],  [place], [place] [address]    Culture   Final    NO GROWTH < 24 HOURS Performed at [facility], [address], [place], [place] [address]    Report Status PENDING  Incomplete  Resp panel by RT-PCR (RSV, Flu A&B, Covid) Anterior Nasal Swab     Status: None   Collection Time: 09/21/24  9:53 PM   Specimen: Anterior Nasal Swab  Result Value Ref Range Status   SARS Coronavirus 2 by RT PCR NEGATIVE NEGATIVE Final    Comment: (NOTE) SARS-CoV-2 target nucleic acids are NOT DETECTED.  The SARS-CoV-2 RNA is generally detectable in upper respiratory specimens during the acute phase of infection. The lowest concentration of SARS-CoV-2 viral copies this assay can detect is 138 copies/mL. A negative result does not preclude SARS-Cov-2 infection and should not be used as the sole basis for treatment or other patient management decisions. A negative result may occur with  improper specimen collection/handling, submission of specimen other than nasopharyngeal swab, presence of viral mutation(s) within the areas targeted by this assay, and inadequate number of viral copies(<138 copies/mL). A negative result must be combined with clinical observations, patient history, and epidemiological information. The expected  result is Negative.  Fact Sheet for Patients:  bloggercourse.com  Fact Sheet for Healthcare Providers:  seriousbroker.it  This test is no t yet approved or cleared by the [place]  FDA and  has been authorized for detection and/or diagnosis of SARS-CoV-2 by FDA under an Emergency Use Authorization (EUA). This EUA will remain  in effect (meaning this test can be used) for the duration of the COVID-19 declaration under Section 564(b)(1) of the Act, 21 U.S.C.section 360bbb-3(b)(1), unless the authorization is terminated  or revoked sooner.       Influenza A by PCR NEGATIVE NEGATIVE Final   Influenza B by PCR NEGATIVE NEGATIVE  Final    Comment: (NOTE) The Xpert Xpress SARS-CoV-2/FLU/RSV plus assay is intended as an aid in the diagnosis of influenza from Nasopharyngeal swab specimens and should not be used as a sole basis for treatment. Nasal washings and aspirates are unacceptable for Xpert Xpress SARS-CoV-2/FLU/RSV testing.  Fact Sheet for Patients: bloggercourse.com  Fact Sheet for Healthcare Providers: seriousbroker.it  This test is not yet approved or cleared by the [place]  FDA and has been authorized for detection and/or diagnosis of SARS-CoV-2 by FDA under an Emergency Use Authorization (EUA). This EUA will remain in effect (meaning this test can be used) for the duration of the COVID-19 declaration under Section 564(b)(1) of the Act, 21 U.S.C. section 360bbb-3(b)(1), unless the authorization is terminated or revoked.     Resp Syncytial Virus by PCR NEGATIVE NEGATIVE Final    Comment: (NOTE) Fact Sheet for Patients: bloggercourse.com  Fact Sheet for Healthcare Providers: seriousbroker.it  This test is not yet approved or cleared by the [place]  FDA and has been authorized for detection and/or diagnosis of SARS-CoV-2 by FDA under an Emergency Use Authorization (EUA). This EUA will remain in effect (meaning this test can be used) for the duration of the COVID-19 declaration under Section 564(b)(1) of the Act, 21 U.S.C. section 360bbb-3(b)(1), unless the authorization is terminated or revoked.  Performed at [facility], [address] [place], [place] [address]   Respiratory (~20 pathogens) panel by PCR     Status: None   Collection Time: 09/22/24  7:59 AM   Specimen: Nasal Mucosa; Respiratory  Result Value Ref Range Status   Adenovirus NOT DETECTED NOT DETECTED Final   Coronavirus 229E NOT DETECTED NOT DETECTED Final    Comment: (NOTE) The Coronavirus on the  Respiratory Panel, DOES NOT test for the novel  Coronavirus (2019 nCoV)    Coronavirus HKU1 NOT DETECTED NOT DETECTED Final   Coronavirus NL63 NOT DETECTED NOT DETECTED Final   Coronavirus OC43 NOT DETECTED NOT DETECTED Final   Metapneumovirus NOT DETECTED NOT DETECTED Final   Rhinovirus / Enterovirus NOT DETECTED NOT DETECTED Final   Influenza A NOT DETECTED NOT DETECTED Final   Influenza B NOT DETECTED NOT DETECTED Final   Parainfluenza Virus 1 NOT DETECTED NOT DETECTED Final   Parainfluenza Virus 2 NOT DETECTED NOT DETECTED Final   Parainfluenza Virus 3 NOT DETECTED NOT DETECTED Final   Parainfluenza Virus 4 NOT DETECTED NOT DETECTED Final   Respiratory Syncytial Virus NOT DETECTED NOT DETECTED Final   Bordetella pertussis NOT DETECTED NOT DETECTED Final   Bordetella Parapertussis NOT DETECTED NOT DETECTED Final   Chlamydophila pneumoniae NOT DETECTED NOT DETECTED Final   Mycoplasma pneumoniae NOT DETECTED NOT DETECTED Final    Comment: Performed at [facility], [address], [place], [place] [address]  MRSA Next Gen by PCR, Nasal  Status: None   Collection Time: 09/22/24  7:59 AM  Result Value Ref Range Status   MRSA by PCR Next Gen NOT DETECTED NOT DETECTED Final    Comment: (NOTE) The GeneXpert MRSA Assay (FDA approved for NASAL specimens only), is one component of a comprehensive MRSA colonization surveillance program. It is not intended to diagnose MRSA infection nor to guide or monitor treatment for MRSA infections. Test performance is not FDA approved in patients less than 51 years old. Performed at [facility], [address], [place], [place] [address]       Studies: No results found.   Scheduled Meds:  azithromycin   500 mg Oral Daily   Darunavir -Cobicistat -Emtricitabine -Tenofovir  Alafenamide  1 tablet Oral Daily   enoxaparin  (LOVENOX ) injection  40 mg Subcutaneous Q24H   sodium chloride  flush  3 mL Intravenous Q12H     Continuous Infusions:  cefTRIAXone  (ROCEPHIN )  IV       LOS: 1 day     Eleshia Wooley J Lekeya Rollings, MD [facility]  If 7PM-7AM, please contact night-coverage [email or web address] 09/23/2024, 5:56 PM    "

## 2024-09-24 ENCOUNTER — Other Ambulatory Visit: Payer: Self-pay

## 2024-09-24 ENCOUNTER — Other Ambulatory Visit (HOSPITAL_COMMUNITY): Payer: Self-pay

## 2024-09-24 ENCOUNTER — Other Ambulatory Visit: Payer: Self-pay | Admitting: *Deleted

## 2024-09-24 DIAGNOSIS — B20 Human immunodeficiency virus [HIV] disease: Secondary | ICD-10-CM | POA: Diagnosis not present

## 2024-09-24 DIAGNOSIS — J189 Pneumonia, unspecified organism: Secondary | ICD-10-CM | POA: Diagnosis not present

## 2024-09-24 DIAGNOSIS — Q780 Osteogenesis imperfecta: Secondary | ICD-10-CM | POA: Diagnosis not present

## 2024-09-24 DIAGNOSIS — F17210 Nicotine dependence, cigarettes, uncomplicated: Secondary | ICD-10-CM | POA: Diagnosis not present

## 2024-09-24 DIAGNOSIS — J188 Other pneumonia, unspecified organism: Secondary | ICD-10-CM | POA: Diagnosis not present

## 2024-09-24 LAB — BASIC METABOLIC PANEL WITH GFR
Anion gap: 9 (ref 5–15)
BUN: 20 mg/dL (ref 6–20)
CO2: 22 mmol/L (ref 22–32)
Calcium: 8.9 mg/dL (ref 8.9–10.3)
Chloride: 105 mmol/L (ref 98–111)
Creatinine, Ser: 1.07 mg/dL (ref 0.61–1.24)
GFR, Estimated: >60 mL/min
Glucose, Bld: 94 mg/dL (ref 70–99)
Potassium: 4.1 mmol/L (ref 3.5–5.1)
Sodium: 137 mmol/L (ref 135–145)

## 2024-09-24 LAB — CBC
HCT: 29.7 % — ABNORMAL LOW (ref 39.0–52.0)
Hemoglobin: 9.9 g/dL — ABNORMAL LOW (ref 13.0–17.0)
MCH: 30.9 pg (ref 26.0–34.0)
MCHC: 33.3 g/dL (ref 30.0–36.0)
MCV: 92.8 fL (ref 80.0–100.0)
Platelets: 304 K/uL (ref 150–400)
RBC: 3.2 MIL/uL — ABNORMAL LOW (ref 4.22–5.81)
RDW: 12.7 % (ref 11.5–15.5)
WBC: 11.2 K/uL — ABNORMAL HIGH (ref 4.0–10.5)
nRBC: 0 % (ref 0.0–0.2)

## 2024-09-24 LAB — LEGIONELLA PNEUMOPHILA SEROGP 1 UR AG: L. pneumophila Serogp 1 Ur Ag: NEGATIVE

## 2024-09-24 LAB — HEPATITIS B SURFACE ANTIGEN: Hepatitis B Surface Ag: NONREACTIVE

## 2024-09-24 LAB — T-HELPER CELLS (CD4) COUNT (NOT AT ARMC)
CD4 % Helper T Cell: 4 % — ABNORMAL LOW (ref 33–65)
CD4 T Cell Abs: 50 /uL — ABNORMAL LOW (ref 400–1790)

## 2024-09-24 LAB — SYPHILIS: RPR W/REFLEX TO RPR TITER AND TREPONEMAL ANTIBODIES, TRADITIONAL SCREENING AND DIAGNOSIS ALGORITHM
RPR Ser Ql: REACTIVE — AB
RPR Titer: 1:8 {titer}

## 2024-09-24 LAB — HEPATITIS C ANTIBODY: HCV Ab: NONREACTIVE

## 2024-09-24 MED ORDER — AMOXICILLIN-POT CLAVULANATE 875-125 MG PO TABS
1.0000 | ORAL_TABLET | Freq: Two times a day (BID) | ORAL | Status: DC
  Administered 2024-09-24 – 2024-09-27 (×7): 1 via ORAL
  Filled 2024-09-24 (×7): qty 1

## 2024-09-24 MED ORDER — SYMTUZA 800-150-200-10 MG PO TABS
1.0000 | ORAL_TABLET | Freq: Every day | ORAL | 0 refills | Status: AC
  Filled 2024-09-24 – 2024-09-25 (×3): qty 30, 30d supply, fill #0

## 2024-09-24 MED ORDER — MAGIC MOUTHWASH W/LIDOCAINE
10.0000 mL | Freq: Once | ORAL | Status: AC | PRN
  Administered 2024-09-24: 10 mL via ORAL
  Filled 2024-09-24: qty 10

## 2024-09-24 NOTE — Progress Notes (Signed)
 "  PROGRESS NOTE  [name] [name] FMW:[id number] DOB: Jun 05, 1996 DOA: 09/21/2024 PCP: Scarlett Ronal Caldron, NP  HPI/Recap of past 24 hours: [name] [name] is a 28 y.o. transgender male with medical history significant for HIV/AIDS who presents with SOB, cough and general malaise.  In the ED, noted to be febrile, saturating low 90s on room air, tachycardic and stable BP. Labs fairly stable with negative COVID-19, RSV, and influenza PCR.  Chest x-ray showed bibasilar pulmonary airspace infiltrates, left greater than right, likely multifocal infection or aspiration.  Patient admitted for further management.     Patient still with pleuritic left-sided chest pain likely 2/2 pneumonia    Assessment/Plan: Principal Problem:   Multifocal pneumonia Active Problems:   AIDS (acquired immune deficiency syndrome) (HCC)  Sepsis 2/2 multifocal pneumonia  On admission, noted to be febrile, tachycardic Currently afebrile, with leukocytosis (on prednisone ) COVID, RSV, influenza PCR, urine strep pneumonia negative, Legionella, sputum culture pending RVP negative Fungitell pending BC x 2 NGTD UA negative Chest x-ray showed bibasilar pulmonary airspace infiltrates, left greater than right, likely multifocal infection or aspiration CTA chest showed no PE, noted dense consolidation within the LLL consistent with acute lobar pneumonia Continue Rocephin , azithromycin , Bactrim  ppx ID on board, appreciate rec  Anemia of chronic disease Iron deficiency anemia Vitamin B12 deficiency Baseline hemoglobin around 12 No obvious signs of bleeding Anemia panel showed iron 38, sats 14, vitamin B12 306 Continue Iron and vitamin B12 supplement Daily CBC  Hypomagnesemia Replace as needed   HIV/AIDS  CD4 was 163 and VL 10,500 in September 2025 Continue Symtuza , bactrim  ppx  Homelessness TOC consulted     Estimated body mass index is 20.04 kg/m as calculated from the following:   Height as of this encounter:  6' 8 (2.032 m).   Weight as of this encounter: 82.7 kg.     Code Status: Full  Family Communication: None at bedside  Disposition Plan: Status is: Inpatient Remains inpatient appropriate because: Level of care      Consultants: None  Procedures: None  Antimicrobials: Ceftriaxone  Azithromycin   DVT prophylaxis: Lovenox    Objective: Vitals:   09/23/24 0512 09/23/24 1342 09/23/24 2138 09/24/24 0435  BP: 123/79 111/75 119/73 129/82  Pulse: 73 83 66 66  Resp: 20 19 16 20   Temp: 97.8 F (36.6 C) 98.1 F (36.7 C) 97.8 F (36.6 C) 98.3 F (36.8 C)  TempSrc: Oral Oral Oral Oral  SpO2: 97% 98% 98% 100%  Weight:      Height:        Intake/Output Summary (Last 24 hours) at 09/24/2024 1157 Last data filed at 09/24/2024 0519 Gross per 24 hour  Intake 583 ml  Output 1050 ml  Net -467 ml   Filed Weights   09/22/24 1530  Weight: 82.7 kg    Exam: General: NAD  Cardiovascular: S1, S2 present Respiratory: Diminished breath sounds bilaterally Abdomen: Soft, nontender, nondistended, bowel sounds present Musculoskeletal: No bilateral pedal edema noted Skin: Normal Psychiatry: Normal mood     Data Reviewed: CBC: Recent Labs  Lab 09/21/24 2204 09/22/24 0513 09/23/24 0526 09/24/24 0526  WBC 7.8 9.6 12.6* 11.2*  HGB 10.1* 9.3* 10.0* 9.9*  HCT 31.2* 28.7* 30.6* 29.7*  MCV 94.0 93.2 93.0 92.8  PLT 227 199 252 304   Basic Metabolic Panel: Recent Labs  Lab 09/21/24 2204 09/22/24 0513 09/23/24 0526 09/24/24 0526  NA 132* 134* 134* 137  K 3.8 3.8 4.8 4.1  CL 98 102 102 105  CO2 25  24 21* 22  GLUCOSE 108* 97 140* 94  BUN 15 17 16 20   CREATININE 1.22 1.14 1.24 1.07  CALCIUM 9.0 8.2* 9.0 8.9  MG  --  1.6* 2.7*  --    GFR: Estimated Creatinine Clearance (by C-G formula based on SCr of 1.07 mg/dL) Male: 899.5 mL/min Male: 118.1 mL/min Liver Function Tests: No results for input(s): AST, ALT, ALKPHOS, BILITOT, PROT, ALBUMIN in the last  168 hours. No results for input(s): LIPASE, AMYLASE in the last 168 hours. No results for input(s): AMMONIA in the last 168 hours. Coagulation Profile: No results for input(s): INR, PROTIME in the last 168 hours. Cardiac Enzymes: No results for input(s): CKTOTAL, CKMB, CKMBINDEX, TROPONINI in the last 168 hours. BNP (last 3 results) No results for input(s): PROBNP in the last 8760 hours. HbA1C: No results for input(s): HGBA1C in the last 72 hours. CBG: No results for input(s): GLUCAP in the last 168 hours. Lipid Profile: No results for input(s): CHOL, HDL, LDLCALC, TRIG, CHOLHDL, LDLDIRECT in the last 72 hours. Thyroid Function Tests: No results for input(s): TSH, T4TOTAL, FREET4, T3FREE, THYROIDAB in the last 72 hours. Anemia Panel: Recent Labs    09/23/24 0526  VITAMINB12 306  FOLATE 6.6  FERRITIN 259  TIBC 262  IRON 38*   Urine analysis:    Component Value Date/Time   COLORURINE YELLOW 09/22/2024 0732   APPEARANCEUR CLEAR 09/22/2024 0732   LABSPEC 1.019 09/22/2024 0732   PHURINE 5.0 09/22/2024 0732   GLUCOSEU NEGATIVE 09/22/2024 0732   HGBUR NEGATIVE 09/22/2024 0732   BILIRUBINUR NEGATIVE 09/22/2024 0732   KETONESUR NEGATIVE 09/22/2024 0732   PROTEINUR NEGATIVE 09/22/2024 0732   NITRITE NEGATIVE 09/22/2024 0732   LEUKOCYTESUR NEGATIVE 09/22/2024 0732   Sepsis Labs: @LABRCNTIP (procalcitonin:4,lacticidven:4)  ) Recent Results (from the past 240 hours)  Blood culture (routine x 2)     Status: None (Preliminary result)   Collection Time: 09/21/24  2:55 AM   Specimen: BLOOD  Result Value Ref Range Status   Specimen Description   Final    BLOOD BLOOD RIGHT FOREARM Performed at [facility], [address], [place], [place] [address]    Special Requests   Final    BOTTLES DRAWN AEROBIC ONLY Blood Culture results may not be optimal due to an inadequate volume of blood received in culture  bottles Performed at [facility], [address] [place], [place] [address]    Culture   Final    NO GROWTH 2 DAYS Performed at [facility], [address], [place], [place] [address]    Report Status PENDING  Incomplete  Resp panel by RT-PCR (RSV, Flu A&B, Covid) Anterior Nasal Swab     Status: None   Collection Time: 09/21/24  9:53 PM   Specimen: Anterior Nasal Swab  Result Value Ref Range Status   SARS Coronavirus 2 by RT PCR NEGATIVE NEGATIVE Final    Comment: (NOTE) SARS-CoV-2 target nucleic acids are NOT DETECTED.  The SARS-CoV-2 RNA is generally detectable in upper respiratory specimens during the acute phase of infection. The lowest concentration of SARS-CoV-2 viral copies this assay can detect is 138 copies/mL. A negative result does not preclude SARS-Cov-2 infection and should not be used as the sole basis for treatment or other patient management decisions. A negative result may occur with  improper specimen collection/handling, submission of specimen other than nasopharyngeal swab, presence of viral mutation(s) within the areas targeted by this assay, and inadequate number of viral  copies(<138 copies/mL). A negative result must be combined with clinical observations, patient history, and epidemiological information. The expected result is Negative.  Fact Sheet for Patients:  bloggercourse.com  Fact Sheet for Healthcare Providers:  seriousbroker.it  This test is no t yet approved or cleared by the [place]  FDA and  has been authorized for detection and/or diagnosis of SARS-CoV-2 by FDA under an Emergency Use Authorization (EUA). This EUA will remain  in effect (meaning this test can be used) for the duration of the COVID-19 declaration under Section 564(b)(1) of the Act, 21 U.S.C.section 360bbb-3(b)(1), unless the authorization is terminated  or revoked sooner.       Influenza A  by PCR NEGATIVE NEGATIVE Final   Influenza B by PCR NEGATIVE NEGATIVE Final    Comment: (NOTE) The Xpert Xpress SARS-CoV-2/FLU/RSV plus assay is intended as an aid in the diagnosis of influenza from Nasopharyngeal swab specimens and should not be used as a sole basis for treatment. Nasal washings and aspirates are unacceptable for Xpert Xpress SARS-CoV-2/FLU/RSV testing.  Fact Sheet for Patients: bloggercourse.com  Fact Sheet for Healthcare Providers: seriousbroker.it  This test is not yet approved or cleared by the [place]  FDA and has been authorized for detection and/or diagnosis of SARS-CoV-2 by FDA under an Emergency Use Authorization (EUA). This EUA will remain in effect (meaning this test can be used) for the duration of the COVID-19 declaration under Section 564(b)(1) of the Act, 21 U.S.C. section 360bbb-3(b)(1), unless the authorization is terminated or revoked.     Resp Syncytial Virus by PCR NEGATIVE NEGATIVE Final    Comment: (NOTE) Fact Sheet for Patients: bloggercourse.com  Fact Sheet for Healthcare Providers: seriousbroker.it  This test is not yet approved or cleared by the [place]  FDA and has been authorized for detection and/or diagnosis of SARS-CoV-2 by FDA under an Emergency Use Authorization (EUA). This EUA will remain in effect (meaning this test can be used) for the duration of the COVID-19 declaration under Section 564(b)(1) of the Act, 21 U.S.C. section 360bbb-3(b)(1), unless the authorization is terminated or revoked.  Performed at [facility], [address] [place], [place] [address]   Respiratory (~20 pathogens) panel by PCR     Status: None   Collection Time: 09/22/24  7:59 AM   Specimen: Nasal Mucosa; Respiratory  Result Value Ref Range Status   Adenovirus NOT DETECTED NOT DETECTED Final   Coronavirus 229E NOT  DETECTED NOT DETECTED Final    Comment: (NOTE) The Coronavirus on the Respiratory Panel, DOES NOT test for the novel  Coronavirus (2019 nCoV)    Coronavirus HKU1 NOT DETECTED NOT DETECTED Final   Coronavirus NL63 NOT DETECTED NOT DETECTED Final   Coronavirus OC43 NOT DETECTED NOT DETECTED Final   Metapneumovirus NOT DETECTED NOT DETECTED Final   Rhinovirus / Enterovirus NOT DETECTED NOT DETECTED Final   Influenza A NOT DETECTED NOT DETECTED Final   Influenza B NOT DETECTED NOT DETECTED Final   Parainfluenza Virus 1 NOT DETECTED NOT DETECTED Final   Parainfluenza Virus 2 NOT DETECTED NOT DETECTED Final   Parainfluenza Virus 3 NOT DETECTED NOT DETECTED Final   Parainfluenza Virus 4 NOT DETECTED NOT DETECTED Final   Respiratory Syncytial Virus NOT DETECTED NOT DETECTED Final   Bordetella pertussis NOT DETECTED NOT DETECTED Final   Bordetella Parapertussis NOT DETECTED NOT DETECTED Final   Chlamydophila pneumoniae NOT DETECTED NOT DETECTED Final   Mycoplasma pneumoniae NOT DETECTED NOT DETECTED Final    Comment: Performed at [facility]  [facility], [address], [place], [place] [address]  MRSA Next Gen by PCR, Nasal     Status: None   Collection Time: 09/22/24  7:59 AM  Result Value Ref Range Status   MRSA by PCR Next Gen NOT DETECTED NOT DETECTED Final    Comment: (NOTE) The GeneXpert MRSA Assay (FDA approved for NASAL specimens only), is one component of a comprehensive MRSA colonization surveillance program. It is not intended to diagnose MRSA infection nor to guide or monitor treatment for MRSA infections. Test performance is not FDA approved in patients less than 60 years old. Performed at [facility], [address] [place], [place] [address]       Studies: CT Angio Chest Pulmonary Embolism (PE) W or WO Contrast Result Date: 09/23/2024 EXAM: CTA CHEST 09/23/2024 06:49:13 PM TECHNIQUE: CTA of the chest was performed without and with the administration of  75 mL of intravenous iohexol  (OMNIPAQUE ) 350 MG/ML injection. Multiplanar reformatted images are provided for review. MIP images are provided for review. Automated exposure control, iterative reconstruction, and/or weight based adjustment of the mA/kV was utilized to reduce the radiation dose to as low as reasonably achievable. COMPARISON: None available. CLINICAL HISTORY: Smoker, pleuritic chest pain left sided. ?PE. FINDINGS: PULMONARY ARTERIES: Pulmonary arteries are adequately opacified for evaluation. No acute pulmonary embolus. Main pulmonary artery is normal in caliber. MEDIASTINUM: The heart and pericardium demonstrate no acute abnormality. There is no acute abnormality of the thoracic aorta. LYMPH NODES: No mediastinal, hilar or axillary lymphadenopathy. LUNGS AND PLEURA: There is dense consolidation within the left lower lobe in keeping with acute lobar pneumonia in the appropriate clinical setting. Scattered airspace infiltrate is also seen within the basilar right middle lobe. Mild right basilar atelectasis. No central obstructing lesion. No pneumothorax or pleural effusion. UPPER ABDOMEN: Limited images of the upper abdomen are unremarkable. SOFT TISSUES AND BONES: No acute bone or soft tissue abnormality. IMPRESSION: 1. No pulmonary embolism. 2. Dense consolidation within the left lower lobe, consistent with acute lobar pneumonia. Electronically signed by: Dorethia Molt MD 09/23/2024 09:30 PM EST RP Workstation: [id number]     Scheduled Meds:  azithromycin   500 mg Oral Daily   vitamin B-12  1,000 mcg Oral Daily   Darunavir -Cobicistat -Emtricitabine -Tenofovir  Alafenamide  1 tablet Oral Daily   enoxaparin  (LOVENOX ) injection  40 mg Subcutaneous Q24H   ferrous sulfate   325 mg Oral Q breakfast   sodium chloride  flush  3 mL Intravenous Q12H   sulfamethoxazole -trimethoprim   1 tablet Oral Daily    Continuous Infusions:  cefTRIAXone  (ROCEPHIN )  IV Stopped (09/23/24 2133)     LOS: 2 days      Lebron JINNY Cage, MD [facility]  If 7PM-7AM, please contact night-coverage [email or web address] 09/24/2024, 11:57 AM    "

## 2024-09-24 NOTE — Plan of Care (Signed)
  Problem: Education: Goal: Knowledge of General Education information will improve Description: Including pain rating scale, medication(s)/side effects and non-pharmacologic comfort measures Outcome: Progressing   Problem: Clinical Measurements: Goal: Ability to maintain clinical measurements within normal limits will improve Outcome: Progressing   Problem: Coping: Goal: Level of anxiety will decrease Outcome: Progressing   

## 2024-09-24 NOTE — TOC Initial Note (Addendum)
 Transition of Care [facility]) - Initial/Assessment Note    Patient Details  Name: [name] [name] MRN: [id number] Date of Birth: 04/27/1996  Transition of Care [facility]) CM/SW Contact:    Bascom Service, RN Phone Number: 09/24/2024, 4:05 PM  Clinical Narrative: Spoke to patient about d/c plans-Stay in homeless shelter([facility]) during the day-Provided homeless shelter resources,transportation, Substance use resources. Will provide bus pass @ d/c.                  Expected Discharge Plan: Homeless Shelter Barriers to Discharge: Continued Medical Work up   Patient Goals and CMS Choice Patient states their goals for this hospitalization and ongoing recovery are:: shelter CMS Medicare.gov Compare Post Acute Care list provided to:: Patient Choice offered to / list presented to : [name]    Expected Discharge Plan and Services   Discharge Planning Services: CM Consult   Living arrangements for the past 2 months: Homeless                                      Prior Living Arrangements/Services Living arrangements for the past 2 months: Homeless                     Activities of Daily Living   ADL Screening (condition at time of admission) Independently performs ADLs?: Yes (appropriate for developmental age) Is the patient deaf or have difficulty hearing?: No Does the patient have difficulty seeing, even when wearing glasses/contacts?: No Does the patient have difficulty concentrating, remembering, or making decisions?: No  Permission Sought/Granted                  Emotional Assessment              Admission diagnosis:  Multifocal pneumonia [J18.8] Sepsis with acute hypoxic respiratory failure without septic shock, due to unspecified organism (HCC) [A41.9, R65.20, J96.01] Patient Active Problem List   Diagnosis Date Noted   Multifocal pneumonia 09/22/2024   Sore throat 01/19/2024    Anxiety 01/19/2024   Abscess of face 10/15/2023   Facial cellulitis 10/15/2023   Polysubstance use disorder 03/10/2023   Male-to-male transgender person 02/07/2023   Poor social situation 02/07/2023   Syphilis 06/02/2020   Healthcare maintenance 06/02/2020   Axillary abscess 02/13/2020   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, 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   Enteropathogenic Escherichia coli infection 05/26/2017   Acute renal failure (ARF) 05/24/2017   Abnormal brain MRI 02/07/2017   Rash 11/15/2016   Hypotension 11/05/2016   Shingles 09/14/2016   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 - [place] - [address] AT [facility] OF [address] [place] [address] Phone: [phone] Fax: [phone]  [facility] - [facility] [address] [place] [address] Phone: [phone]  Fax: [phone]  [facility] [address], [place] - [address] AT [facility] [address] [place] [address] Phone: [phone] Fax: [phone]  [place] [facility] [address] [place] [address] Phone: [phone] Fax: [phone]     Social Drivers of Health (SDOH) Social History: SDOH Screenings   Food Insecurity: Food Insecurity Present (09/22/2024)  Housing: High Risk (09/23/2024)  Transportation Needs: Unmet Transportation Needs (09/22/2024)   Utilities: At Risk (09/22/2024)  Depression (PHQ2-9): Low Risk (01/19/2024)  Financial Resource Strain: Medium Risk (09/09/2022)   Received from [facility]  Tobacco Use: High Risk (09/22/2024)   SDOH Interventions:     Readmission Risk Interventions     No data to display

## 2024-09-24 NOTE — Progress Notes (Signed)
 "        [facility]  Date of Admission:  09/21/2024     Lines:  peripheral   Abx: 12/27-c ceftriaxone  12/27-c azith 12/27-c bactrim  pjp tx dose   Other: 12/27-c prednisone                                                         Assessment: 28 y.o. adult noncompliant with hiv tx, aids criteria, iv drug use (meth), admitted with 1 day acute onset left sided chest pain and malaise/dyspnea     #hiv Recent Labs       Lab Results  Component Value Date    HIV1RNAQUANT 10,500 (H) 05/29/2024      Recent Labs       Lab Results  Component Value Date    CD4TCELL 6 (L) 05/29/2024    CD4TABS 163 (L) 05/29/2024    Sees Greg normally at [facility] Noncompliance due to not going to [facility] but seems to at one point in 2018 controlled [place] he is willing to go although did mention busy couldn't go to clinic previously Will try to do one more time initiation here and provide 30 day supply on hand.      #oi Repeat cd4 here Change bactrim  to pcp prophy dosing     #pleuritic chest pain #pna Need to R/o pe given smoking hx and acute onset pleuritic chest pain  Continue cap tx Low suspicion for pjp       #hcm Rpr & hepatitis testing in progress       Plan: Change ceftriaxone /azithromycin  to augmentin /azithromycin  to finish 7 day CAP tx on 09/29/2024 Continue symtuza  & bactrim   Please provide 1 month supply symtuza /bactrim  on discharge ID clinic f/u on 1/20 @ 1pm Maintain standard isolation precaution Id will sign off      Principal Problem:   Multifocal pneumonia Active Problems:   AIDS (acquired immune deficiency syndrome) (HCC)   Allergies[1]  Scheduled Meds:  amoxicillin -clavulanate  1 tablet Oral Q12H   azithromycin   500 mg Oral Daily   vitamin B-12  1,000 mcg Oral Daily   Darunavir -Cobicistat -Emtricitabine -Tenofovir  Alafenamide  1 tablet Oral Daily   enoxaparin  (LOVENOX ) injection  40 mg Subcutaneous Q24H   ferrous sulfate   325 mg  Oral Q breakfast   sodium chloride  flush  3 mL Intravenous Q12H   sulfamethoxazole -trimethoprim   1 tablet Oral Daily   Continuous Infusions: PRN Meds:.acetaminophen  **OR** acetaminophen , guaiFENesin , HYDROmorphone  (DILAUDID ) injection, ondansetron  **OR** ondansetron  (ZOFRAN ) IV, oxyCODONE , senna-docusate   SUBJECTIVE: Feels better; more energy; less chest pain Afebrile On room air  Review of Systems: ROS All other ROS was negative, except mentioned above     OBJECTIVE: Vitals:   09/23/24 0512 09/23/24 1342 09/23/24 2138 09/24/24 0435  BP: 123/79 111/75 119/73 129/82  Pulse: 73 83 66 66  Resp: 20 19 16 20   Temp: 97.8 F (36.6 C) 98.1 F (36.7 C) 97.8 F (36.6 C) 98.3 F (36.8 C)  TempSrc: Oral Oral Oral Oral  SpO2: 97% 98% 98% 100%  Weight:      Height:       Body mass index is 20.04 kg/m.  Physical Exam General/constitutional: no distress, pleasant HEENT: Normocephalic, PER, Conj Clear, EOMI, Oropharynx clear Neck supple CV: rrr no mrg Lungs: clear to auscultation, normal respiratory effort Abd:  Soft, Nontender Ext: no edema Skin: No Rash Neuro: nonfocal MSK: no peripheral joint swelling/tenderness/warmth; back spines nontender    Lab Results Lab Results  Component Value Date   WBC 11.2 (H) 09/24/2024   HGB 9.9 (L) 09/24/2024   HCT 29.7 (L) 09/24/2024   MCV 92.8 09/24/2024   PLT 304 09/24/2024    Lab Results  Component Value Date   CREATININE 1.07 09/24/2024   BUN 20 09/24/2024   NA 137 09/24/2024   K 4.1 09/24/2024   CL 105 09/24/2024   CO2 22 09/24/2024    Lab Results  Component Value Date   ALT 7 (L) 04/19/2024   AST 13 04/19/2024   ALKPHOS 74 10/15/2023   BILITOT 0.4 04/19/2024      Microbiology: Recent Results (from the past 240 hours)  Blood culture (routine x 2)     Status: None (Preliminary result)   Collection Time: 09/21/24  2:55 AM   Specimen: BLOOD  Result Value Ref Range Status   Specimen Description   Final    BLOOD  BLOOD RIGHT FOREARM Performed at [facility], [address] [place], [place] [address]    Special Requests   Final    BOTTLES DRAWN AEROBIC ONLY Blood Culture results may not be optimal due to an inadequate volume of blood received in culture bottles Performed at [facility], [address], [place], [place] [address]    Culture   Final    NO GROWTH 2 DAYS Performed at [facility], [address], [place], [place] [address]    Report Status PENDING  Incomplete  Resp panel by RT-PCR (RSV, Flu A&B, Covid) Anterior Nasal Swab     Status: None   Collection Time: 09/21/24  9:53 PM   Specimen: Anterior Nasal Swab  Result Value Ref Range Status   SARS Coronavirus 2 by RT PCR NEGATIVE NEGATIVE Final    Comment: (NOTE) SARS-CoV-2 target nucleic acids are NOT DETECTED.  The SARS-CoV-2 RNA is generally detectable in upper respiratory specimens during the acute phase of infection. The lowest concentration of SARS-CoV-2 viral copies this assay can detect is 138 copies/mL. A negative result does not preclude SARS-Cov-2 infection and should not be used as the sole basis for treatment or other patient management decisions. A negative result may occur with  improper specimen collection/handling, submission of specimen other than nasopharyngeal swab, presence of viral mutation(s) within the areas targeted by this assay, and inadequate number of viral copies(<138 copies/mL). A negative result must be combined with clinical observations, patient history, and epidemiological information. The expected result is Negative.  Fact Sheet for Patients:  bloggercourse.com  Fact Sheet for Healthcare Providers:  seriousbroker.it  This test is no t yet approved or cleared by the [place]  FDA and  has been authorized for detection and/or diagnosis of SARS-CoV-2 by FDA under an Emergency Use Authorization  (EUA). This EUA will remain  in effect (meaning this test can be used) for the duration of the COVID-19 declaration under Section 564(b)(1) of the Act, 21 U.S.C.section 360bbb-3(b)(1), unless the authorization is terminated  or revoked sooner.       Influenza A by PCR NEGATIVE NEGATIVE Final   Influenza B by PCR NEGATIVE NEGATIVE Final    Comment: (NOTE) The Xpert Xpress SARS-CoV-2/FLU/RSV plus assay is intended as an aid in the diagnosis of influenza from Nasopharyngeal swab specimens and should not be used as a sole basis for treatment. Nasal washings and aspirates  are unacceptable for Xpert Xpress SARS-CoV-2/FLU/RSV testing.  Fact Sheet for Patients: bloggercourse.com  Fact Sheet for Healthcare Providers: seriousbroker.it  This test is not yet approved or cleared by the [place]  FDA and has been authorized for detection and/or diagnosis of SARS-CoV-2 by FDA under an Emergency Use Authorization (EUA). This EUA will remain in effect (meaning this test can be used) for the duration of the COVID-19 declaration under Section 564(b)(1) of the Act, 21 U.S.C. section 360bbb-3(b)(1), unless the authorization is terminated or revoked.     Resp Syncytial Virus by PCR NEGATIVE NEGATIVE Final    Comment: (NOTE) Fact Sheet for Patients: bloggercourse.com  Fact Sheet for Healthcare Providers: seriousbroker.it  This test is not yet approved or cleared by the [place]  FDA and has been authorized for detection and/or diagnosis of SARS-CoV-2 by FDA under an Emergency Use Authorization (EUA). This EUA will remain in effect (meaning this test can be used) for the duration of the COVID-19 declaration under Section 564(b)(1) of the Act, 21 U.S.C. section 360bbb-3(b)(1), unless the authorization is terminated or revoked.  Performed at [facility], [address] [place], [place] [address]   Respiratory (~20 pathogens) panel by PCR     Status: None   Collection Time: 09/22/24  7:59 AM   Specimen: Nasal Mucosa; Respiratory  Result Value Ref Range Status   Adenovirus NOT DETECTED NOT DETECTED Final   Coronavirus 229E NOT DETECTED NOT DETECTED Final    Comment: (NOTE) The Coronavirus on the Respiratory Panel, DOES NOT test for the novel  Coronavirus (2019 nCoV)    Coronavirus HKU1 NOT DETECTED NOT DETECTED Final   Coronavirus NL63 NOT DETECTED NOT DETECTED Final   Coronavirus OC43 NOT DETECTED NOT DETECTED Final   Metapneumovirus NOT DETECTED NOT DETECTED Final   Rhinovirus / Enterovirus NOT DETECTED NOT DETECTED Final   Influenza A NOT DETECTED NOT DETECTED Final   Influenza B NOT DETECTED NOT DETECTED Final   Parainfluenza Virus 1 NOT DETECTED NOT DETECTED Final   Parainfluenza Virus 2 NOT DETECTED NOT DETECTED Final   Parainfluenza Virus 3 NOT DETECTED NOT DETECTED Final   Parainfluenza Virus 4 NOT DETECTED NOT DETECTED Final   Respiratory Syncytial Virus NOT DETECTED NOT DETECTED Final   Bordetella pertussis NOT DETECTED NOT DETECTED Final   Bordetella Parapertussis NOT DETECTED NOT DETECTED Final   Chlamydophila pneumoniae NOT DETECTED NOT DETECTED Final   Mycoplasma pneumoniae NOT DETECTED NOT DETECTED Final    Comment: Performed at [facility], [address], [place], [place] [address]  MRSA Next Gen by PCR, Nasal     Status: None   Collection Time: 09/22/24  7:59 AM  Result Value Ref Range Status   MRSA by PCR Next Gen NOT DETECTED NOT DETECTED Final    Comment: (NOTE) The GeneXpert MRSA Assay (FDA approved for NASAL specimens only), is one component of a comprehensive MRSA colonization surveillance program. It is not intended to diagnose MRSA infection nor to guide or monitor treatment for MRSA infections. Test performance is not FDA approved in patients less than 17 years old. Performed at [facility],  [address], [place], [place] [address]      Serology:   Imaging: If present, new imagings (plain films, ct scans, and mri) have been personally visualized and interpreted; radiology reports have been reviewed. Decision making incorporated into the Impression / Recommendations.  12/29 cta chest 1. No pulmonary embolism. 2. Dense consolidation within the left lower  lobe, consistent with acute lobar pneumonia.  Constance ONEIDA Passer, MD [facility] [phone] pager    09/24/2024, 3:28 PM     [1] No Known Allergies  "

## 2024-09-25 ENCOUNTER — Other Ambulatory Visit (HOSPITAL_COMMUNITY): Payer: Self-pay

## 2024-09-25 ENCOUNTER — Other Ambulatory Visit: Payer: Self-pay

## 2024-09-25 DIAGNOSIS — J188 Other pneumonia, unspecified organism: Secondary | ICD-10-CM | POA: Diagnosis not present

## 2024-09-25 DIAGNOSIS — B20 Human immunodeficiency virus [HIV] disease: Secondary | ICD-10-CM | POA: Diagnosis not present

## 2024-09-25 DIAGNOSIS — A419 Sepsis, unspecified organism: Secondary | ICD-10-CM

## 2024-09-25 LAB — CBC
HCT: 34.2 % — ABNORMAL LOW (ref 39.0–52.0)
Hemoglobin: 11.3 g/dL — ABNORMAL LOW (ref 13.0–17.0)
MCH: 30.5 pg (ref 26.0–34.0)
MCHC: 33 g/dL (ref 30.0–36.0)
MCV: 92.4 fL (ref 80.0–100.0)
Platelets: 339 K/uL (ref 150–400)
RBC: 3.7 MIL/uL — ABNORMAL LOW (ref 4.22–5.81)
RDW: 12.6 % (ref 11.5–15.5)
WBC: 6.3 K/uL (ref 4.0–10.5)
nRBC: 0 % (ref 0.0–0.2)

## 2024-09-25 LAB — BASIC METABOLIC PANEL WITH GFR
Anion gap: 12 (ref 5–15)
BUN: 22 mg/dL — ABNORMAL HIGH (ref 6–20)
CO2: 20 mmol/L — ABNORMAL LOW (ref 22–32)
Calcium: 9.2 mg/dL (ref 8.9–10.3)
Chloride: 103 mmol/L (ref 98–111)
Creatinine, Ser: 1.14 mg/dL (ref 0.61–1.24)
GFR, Estimated: >60 mL/min
Glucose, Bld: 127 mg/dL — ABNORMAL HIGH (ref 70–99)
Potassium: 4 mmol/L (ref 3.5–5.1)
Sodium: 135 mmol/L (ref 135–145)

## 2024-09-25 LAB — HIV-1 RNA QUANT-NO REFLEX-BLD
HIV 1 RNA Quant: 353000 {copies}/mL
LOG10 HIV-1 RNA: 5.548 {Log_copies}/mL

## 2024-09-25 LAB — HEPATITIS B SURFACE ANTIBODY, QUANTITATIVE: Hep B S AB Quant (Post): <3.5 m[IU]/mL — ABNORMAL LOW

## 2024-09-25 LAB — T.PALLIDUM AB, TOTAL: T Pallidum Abs: REACTIVE — AB

## 2024-09-25 LAB — FUNGITELL BETA-D-GLUCAN: Fungitell Value:: <31.25 pg/mL

## 2024-09-25 LAB — HEPATITIS B CORE ANTIBODY, TOTAL: HEP B CORE AB: NEGATIVE

## 2024-09-25 MED ORDER — FLUCONAZOLE 100 MG PO TABS: 200.0000 mg | ORAL_TABLET | Freq: Once | ORAL | Status: DC

## 2024-09-25 MED ORDER — FLUCONAZOLE 100 MG PO TABS
100.0000 mg | ORAL_TABLET | Freq: Every day | ORAL | Status: DC
  Administered 2024-09-25 – 2024-09-26 (×2): 100 mg via ORAL
  Filled 2024-09-25 (×2): qty 1

## 2024-09-25 MED ORDER — NYSTATIN 100000 UNIT/ML MT SUSP: 5.0000 mL | Freq: Four times a day (QID) | OROMUCOSAL | Status: DC

## 2024-09-25 NOTE — Progress Notes (Signed)
" °  Progress Note   Patient: [name] [name] FMW:[id number] DOB: 08-04-1996 DOA: 09/21/2024     3 DOS: the patient was seen and examined on 09/25/2024   Brief hospital course:   Assessment and Plan: Sepsis secondary to multifocal pneumonia COVID, RSV, influenza PCR, RVP negative, Legionella and strep pneumoniae urine antigen negative CT chest no PE, dense left lower lobe consolidation consistent with acute lobar pneumonia Treated with ceftriaxone , azithromycin , Bactrim  Seen by ID with recommendation to change to Augmentin /azithromycin  to finish 7-day treatment 1/4  HIV/AIDs OI prophylaxis Continue Symtuza  and Bactrim  Provide 1 month supply symtuza /bactrim  on discharge ID clinic f/u on 1/20 @ 1pm  Anemia chronic disease Iron deficiency Vitamin B12 deficiency Continue iron, vitamin B12 supplement  Substance abuse UDS positive for methamphetamine  Homelessness  Plan Home next 48 hours    Subjective:  Reports mouth pain  Physical Exam: Vitals:   09/24/24 1552 09/24/24 1942 09/25/24 0550 09/25/24 1339  BP: 117/73 124/85 111/70 106/69  Pulse: 64 70 71 78  Resp:    18  Temp: 97.7 F (36.5 C) 97.9 F (36.6 C) 98.1 F (36.7 C) 98.5 F (36.9 C)  TempSrc: Oral Oral  Oral  SpO2: 99% 96% 98% 98%  Weight:      Height:       Physical Exam Vitals reviewed.  Constitutional:      General: She is not in acute distress.    Appearance: She is not ill-appearing or toxic-appearing.  HENT:     Mouth/Throat:     Comments: White exudate on tongue Cardiovascular:     Rate and Rhythm: Normal rate and regular rhythm.     Heart sounds: No murmur heard. Pulmonary:     Effort: Pulmonary effort is normal. No respiratory distress.     Breath sounds: No wheezing, rhonchi or rales.  Neurological:     Mental Status: She is alert.  Psychiatric:        Mood and Affect: Mood normal.        Behavior: Behavior normal.     Data Reviewed: BMP noted Hemoglobin stable 11.3  Family  Communication: none  Disposition: Status is: Inpatient Remains inpatient appropriate because: pneumonia     Time spent: 35 minutes  Author: Toribio Door, MD 09/25/2024 7:26 PM  For on call review [email or web address]    "

## 2024-09-25 NOTE — Hospital Course (Signed)
 28 year old PMH HIV/AIDS presenting with shortness of breath and malaise.  Methamphetamine use.  Admitted for multifocal pneumonia complicated by HIV/AIDS.  Consultants ID  Procedures/Events

## 2024-09-26 DIAGNOSIS — J188 Other pneumonia, unspecified organism: Secondary | ICD-10-CM | POA: Diagnosis not present

## 2024-09-26 DIAGNOSIS — A419 Sepsis, unspecified organism: Secondary | ICD-10-CM

## 2024-09-26 DIAGNOSIS — B20 Human immunodeficiency virus [HIV] disease: Secondary | ICD-10-CM | POA: Diagnosis not present

## 2024-09-26 DIAGNOSIS — B370 Candidal stomatitis: Secondary | ICD-10-CM | POA: Diagnosis not present

## 2024-09-26 MED ORDER — MAGIC MOUTHWASH
15.0000 mL | Freq: Four times a day (QID) | ORAL | Status: DC
  Administered 2024-09-26 – 2024-09-27 (×4): 15 mL via ORAL
  Filled 2024-09-26 (×6): qty 15

## 2024-09-26 NOTE — Progress Notes (Signed)
" °  Progress Note   Patient: [name] [name] FMW:[id number] DOB: December 28, 1995 DOA: 09/21/2024     4 DOS: the patient was seen and examined on 09/26/2024   Brief hospital course: 29 year old PMH HIV/AIDS presenting with shortness of breath and malaise.  Methamphetamine use.  Admitted for multifocal pneumonia complicated by HIV/AIDS.  Consultants ID  Procedures/Events   Assessment and Plan: Sepsis secondary to multifocal pneumonia COVID, RSV, influenza PCR, RVP negative, Legionella and strep pneumoniae urine antigen negative CT chest no PE, dense left lower lobe consolidation consistent with acute lobar pneumonia Treated with ceftriaxone , azithromycin , Bactrim  Seen by ID with recommendation to change to Augmentin /azithromycin  to finish 7-day treatment 1/4   HIV/AIDs OI prophylaxis Continue Symtuza  and Bactrim  Provide 1 month supply Symtuza /Bactrim  on discharge ID clinic f/u on 1/20 @ 1pm   Anemia chronic disease Iron deficiency Vitamin B12 deficiency Continue iron, vitamin B12 supplement   Substance abuse UDS positive for methamphetamine   Homelessness    Subjective:  Mouth still hurts  Physical Exam: Vitals:   09/25/24 0550 09/25/24 1339 09/25/24 2111 09/26/24 0549  BP: 111/70 106/69 115/77 109/74  Pulse: 71 78 65 71  Resp:  18 18   Temp: 98.1 F (36.7 C) 98.5 F (36.9 C) 98 F (36.7 C) 98 F (36.7 C)  TempSrc:  Oral Oral Oral  SpO2: 98% 98% 100% 99%  Weight:      Height:       Physical Exam Vitals reviewed.  Constitutional:      General: She is not in acute distress.    Appearance: She is not ill-appearing or toxic-appearing.  Cardiovascular:     Rate and Rhythm: Normal rate and regular rhythm.     Heart sounds: No murmur heard. Pulmonary:     Effort: Pulmonary effort is normal. No respiratory distress.     Breath sounds: No wheezing, rhonchi or rales.  Neurological:     Mental Status: She is alert.  Psychiatric:        Mood and Affect: Mood normal.         Behavior: Behavior normal.     Data Reviewed: No new data  Family Communication:   Disposition: Status is: Inpatient Remains inpatient appropriate because: Treatment of pneumonia     Time spent: 20 minutes  Author: Toribio Door, MD 09/26/2024 1:05 PM  For on call review [email or web address]    "

## 2024-09-26 NOTE — Plan of Care (Signed)

## 2024-09-27 ENCOUNTER — Other Ambulatory Visit (HOSPITAL_COMMUNITY): Payer: Self-pay

## 2024-09-27 DIAGNOSIS — J188 Other pneumonia, unspecified organism: Secondary | ICD-10-CM | POA: Diagnosis not present

## 2024-09-27 DIAGNOSIS — B20 Human immunodeficiency virus [HIV] disease: Secondary | ICD-10-CM | POA: Diagnosis not present

## 2024-09-27 DIAGNOSIS — A419 Sepsis, unspecified organism: Secondary | ICD-10-CM | POA: Diagnosis not present

## 2024-09-27 LAB — CULTURE, BLOOD (ROUTINE X 2)
Culture: NO GROWTH
Culture: NO GROWTH

## 2024-09-27 MED ORDER — AMOXICILLIN-POT CLAVULANATE 875-125 MG PO TABS
1.0000 | ORAL_TABLET | Freq: Two times a day (BID) | ORAL | 0 refills | Status: AC
  Filled 2024-09-27: qty 5, 3d supply, fill #0

## 2024-09-27 MED ORDER — FLUCONAZOLE 100 MG PO TABS
200.0000 mg | ORAL_TABLET | Freq: Every day | ORAL | Status: DC
  Administered 2024-09-27: 200 mg via ORAL
  Filled 2024-09-27: qty 2

## 2024-09-27 MED ORDER — FLUCONAZOLE 200 MG PO TABS
200.0000 mg | ORAL_TABLET | Freq: Every day | ORAL | 0 refills | Status: DC
  Filled 2024-09-27: qty 13, 13d supply, fill #0

## 2024-09-27 MED ORDER — NYSTATIN 100000 UNIT/ML MT SUSP
15.0000 mL | Freq: Four times a day (QID) | OROMUCOSAL | 0 refills | Status: AC
  Filled 2024-09-27: qty 420, 7d supply, fill #0

## 2024-09-27 MED ORDER — SULFAMETHOXAZOLE-TRIMETHOPRIM 400-80 MG PO TABS
1.0000 | ORAL_TABLET | Freq: Every day | ORAL | 0 refills | Status: AC
  Filled 2024-09-27: qty 30, 30d supply, fill #0

## 2024-09-27 MED ORDER — CYANOCOBALAMIN 1000 MCG PO TABS: 1000.0000 ug | ORAL_TABLET | Freq: Every day | ORAL | 0 refills | Status: AC

## 2024-09-27 MED ORDER — FERROUS SULFATE 325 (65 FE) MG PO TABS: 325.0000 mg | ORAL_TABLET | Freq: Every day | ORAL | Status: AC

## 2024-09-27 NOTE — Progress Notes (Signed)
 Discharge meds in a secure bag delivered to patient by this RN
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
## 2024-09-27 NOTE — TOC Transition Note (Signed)
 Transition of Care [facility]) - Discharge Note   Patient Details  Name: [name] [name] MRN: [id number] Date of Birth: 1995-12-08  Transition of Care [facility]) CM/SW Contact:  Bascom Service, RN Phone Number: 09/27/2024, 10:06 AM   Clinical Narrative:  d/c to shelter if appropriate;all resources given see prior note. No further CM needs.     Final next level of care: Homeless Shelter Barriers to Discharge: No Barriers Identified   Patient Goals and CMS Choice Patient states their goals for this hospitalization and ongoing recovery are:: shelter CMS Medicare.gov Compare Post Acute Care list provided to:: Patient Choice offered to / list presented to : Patient [place] ownership interest in [facility].provided to:: Patient    Discharge Placement                       Discharge Plan and Services Additional resources added to the After Visit Summary for     Discharge Planning Services: CM Consult                                 Social Drivers of Health (SDOH) Interventions SDOH Screenings   Food Insecurity: Food Insecurity Present (09/22/2024)  Housing: High Risk (09/23/2024)  Transportation Needs: Unmet Transportation Needs (09/22/2024)  Utilities: At Risk (09/22/2024)  Depression (PHQ2-9): Low Risk (01/19/2024)  Financial Resource Strain: Medium Risk (09/09/2022)   Received from [facility]  Tobacco Use: High Risk (09/22/2024)     Readmission Risk Interventions     No data to display

## 2024-09-28 ENCOUNTER — Emergency Department (HOSPITAL_COMMUNITY)
Admission: EM | Admit: 2024-09-28 | Discharge: 2024-09-28 | Discharge status: Against Medical Advice | Attending: Emergency Medicine | Admitting: Emergency Medicine

## 2024-09-28 ENCOUNTER — Other Ambulatory Visit: Payer: Self-pay

## 2024-09-28 DIAGNOSIS — X58XXXA Exposure to other specified factors, initial encounter: Secondary | ICD-10-CM | POA: Diagnosis not present

## 2024-09-28 DIAGNOSIS — S6991XA Unspecified injury of right wrist, hand and finger(s), initial encounter: Secondary | ICD-10-CM | POA: Diagnosis present

## 2024-09-28 DIAGNOSIS — Z5321 Procedure and treatment not carried out due to patient leaving prior to being seen by health care provider: Secondary | ICD-10-CM | POA: Insufficient documentation

## 2024-09-28 NOTE — ED Notes (Signed)
 Pt states I just want a splint for my finger.

## 2024-09-28 NOTE — ED Triage Notes (Signed)
 Patient states he wants another splint. An alternative to what he had. States the splint they placed was to big. Denies any new injury to the right hand.

## 2024-09-28 NOTE — ED Notes (Signed)
 Patient stated he was leaving due to long wait.

## 2024-09-30 ENCOUNTER — Telehealth: Payer: Self-pay | Admitting: *Deleted

## 2024-09-30 NOTE — Transitions of Care (Post Inpatient/ED Visit) (Signed)
" ° °  09/30/2024  Name: [name] [name] MRN: [id number] DOB: [birth year]  Today's TOC FU Call Status: Today's TOC FU Call Status:: Unsuccessful Call (1st Attempt) Unsuccessful Call (1st Attempt) Date: 09/30/24  Attempted to reach the patient regarding the most recent Inpatient/ED visit.  Follow Up Plan: Additional outreach attempts will be made to reach the patient to complete the Transitions of Care (Post Inpatient/ED visit) call.   Cathlean Headland BSN RN [facility] Health Care Management Coordinator [email or web address] Direct Dial: [phone]  Fax: [phone] Website: [email or web address]  "

## 2024-10-01 ENCOUNTER — Telehealth: Payer: Self-pay | Admitting: *Deleted

## 2024-10-01 NOTE — Transitions of Care (Post Inpatient/ED Visit) (Signed)
" ° °  10/01/2024  Name: [name] [name] MRN: [id number] DOB: 23-Mar-1996  Today's TOC FU Call Status: Today's TOC FU Call Status:: Unsuccessful Call (2nd Attempt) Unsuccessful Call (2nd Attempt) Date: 10/01/24  Attempted to reach the patient regarding the most recent Inpatient/ED visit.  Follow Up Plan: Additional outreach attempts will be made to reach the patient to complete the Transitions of Care (Post Inpatient/ED visit) call.   Cathlean Headland BSN RN [facility] Health Care Management Coordinator [email or web address] Direct Dial: [phone]  Fax: [phone] Website: [email or web address]  "

## 2024-10-02 NOTE — Progress Notes (Addendum)
 Enhanced Care Nurse - Population Health  Referral  Type: Provider referral  Visit Type: ED in person visit  Notes : Pt requested private consultation. Pt seen in family room with door ajar.  Pt is upset because he needs his medication refilled. Pt has called his refill into [facility] on [address] Per pt this was called in several days ago. Pt now states he needs it at [facility] on [address] Pt is very upset that nurse will not be able to facilitate him to get to [facility] on [address]. Nurse has advised pt that he should have prescription transferred to the closer location. Nurse will provide food list. [facility] resources. [facility] resources. PA will refer pt to infectious disease for f/u. Pt is aware of resources at the [employer].   Pt was also provided with a food/meal box for discharge from this facility. Pt also provided with an additional sweatshirt and tote bags for his belongings.   General Assessment     Type of Visit Face to face   Assessment Completed With Patient   Interpreter Used No   Preferred Language English   Living Arrangement Alone   Support System None   Type of Residence Homeless       List of Assessments:  SDOH/COMMUNITY RESOURCES:    Current Resources: DSS/CRC Immediate needs or current concerns: Food;Housing;Other (comments)    Food Resources: Food resources Housing resources: Homeless Shelter information provided (Pt refuses shelter list. He states he already has it.)   Garment/textile Technologist general resources: DSS/CRC Additional Resources: [employer]in [employer] ([employer] [employer])  Social Drivers of Health From This Encounter   Living Situation: High Risk (10/02/2024)   Living Situation    What is your living situation today?: I do not have a steady place to live (temporarily staying with others, in a hotel, in a shelter, outside on the street, beach,car, abandoned building, bus or train station, park)    Think about the place you live. Do  you have problems with any of the following? Choose all that apply:: Patient unable to answer  Food Insecurity: High Risk (10/02/2024)   Food vital sign    Within the past 12 months, you worried that your food would run out before you got money to buy more: Often true    Within the past 12 months, the food you bought just didn't last and you didn't have money to get more: Often true  Transportation Needs: Unmet Transportation Needs (10/02/2024)   Transportation    In the past 12 months, has lack of reliable transportation kept you from medical appointments, meetings, work or from getting things needed for daily living? : Yes  Utilities: Unknown (10/02/2024)   Utilities    In the past 12 months has the electric, gas, oil, or water  company threatened to shut off services in your home? : Patient unable to answer  Safety: Low Risk (10/02/2024)   Safety    How often does anyone, including family and friends, physically hurt you?: Never    How often does anyone, including family and friends, insult or talk down to you?: Never    How often does anyone, including family and friends, threaten you with harm?: Never    How often does anyone, including family and friends, scream or curse at you?: Never  Alcohol Screening: Not on file  Tobacco Use: Not on file  Depression: Not on file  Social Connections: Not on file  Financial Resource Strain: Not on  file     Augustin Saupe, BSN, RN, CCM  Population Health-Enhanced Care Nurse [facility] ED [phone]   Electronically signed by: Augustin Saupe, RN 10/02/2024 9:46 AM

## 2024-10-02 NOTE — ED Notes (Signed)
 Pt reports decreased oral intake due to pain.    Heather Ronnald Saba, RN 10/02/24 [phone]

## 2024-10-02 NOTE — ED Triage Notes (Addendum)
 Pain to tongue and roof of mouth, has been using magic mouth wash for 7days w/o relief.  Also last used IV meth on Sunday and has questions about detox.

## 2024-10-05 ENCOUNTER — Emergency Department (HOSPITAL_COMMUNITY)
Admission: EM | Admit: 2024-10-05 | Discharge: 2024-10-05 | Discharge status: Against Medical Advice | Attending: Emergency Medicine | Admitting: Emergency Medicine

## 2024-10-05 ENCOUNTER — Other Ambulatory Visit: Payer: Self-pay

## 2024-10-05 DIAGNOSIS — M79644 Pain in right finger(s): Secondary | ICD-10-CM | POA: Insufficient documentation

## 2024-10-05 DIAGNOSIS — Z5321 Procedure and treatment not carried out due to patient leaving prior to being seen by health care provider: Secondary | ICD-10-CM | POA: Insufficient documentation

## 2024-10-05 MED ORDER — OXYCODONE-ACETAMINOPHEN 5-325 MG PO TABS
1.0000 | ORAL_TABLET | Freq: Once | ORAL | Status: AC
  Administered 2024-10-05: 1 via ORAL
  Filled 2024-10-05: qty 1

## 2024-10-05 NOTE — ED Notes (Signed)
 Pt called for registration multiple times, no response. Unable to visualize pt in lobby.
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
## 2024-10-06 ENCOUNTER — Other Ambulatory Visit: Payer: Self-pay

## 2024-10-06 ENCOUNTER — Encounter (HOSPITAL_COMMUNITY): Payer: Self-pay | Admitting: Emergency Medicine

## 2024-10-06 ENCOUNTER — Emergency Department (HOSPITAL_COMMUNITY)
Admission: EM | Admit: 2024-10-06 | Discharge: 2024-10-06 | Disposition: A | Discharge status: Home / Self Care | Attending: Emergency Medicine | Admitting: Emergency Medicine

## 2024-10-06 DIAGNOSIS — E860 Dehydration: Secondary | ICD-10-CM | POA: Insufficient documentation

## 2024-10-06 DIAGNOSIS — F1523 Other stimulant dependence with withdrawal: Secondary | ICD-10-CM | POA: Diagnosis present

## 2024-10-06 DIAGNOSIS — Z5900 Homelessness unspecified: Secondary | ICD-10-CM | POA: Diagnosis not present

## 2024-10-06 LAB — COMPREHENSIVE METABOLIC PANEL WITH GFR
ALT: 16 U/L (ref 0–44)
AST: 32 U/L (ref 15–41)
Albumin: 3.9 g/dL (ref 3.5–5.0)
Alkaline Phosphatase: 94 U/L (ref 38–126)
Anion gap: 10 (ref 5–15)
BUN: 29 mg/dL — ABNORMAL HIGH (ref 6–20)
CO2: 22 mmol/L (ref 22–32)
Calcium: 9.6 mg/dL (ref 8.9–10.3)
Chloride: 103 mmol/L (ref 98–111)
Creatinine, Ser: 1.74 mg/dL — ABNORMAL HIGH (ref 0.61–1.24)
GFR, Estimated: 53 mL/min — ABNORMAL LOW
Glucose, Bld: 81 mg/dL (ref 70–99)
Potassium: 4.4 mmol/L (ref 3.5–5.1)
Sodium: 134 mmol/L — ABNORMAL LOW (ref 135–145)
Total Bilirubin: 0.3 mg/dL (ref 0.0–1.2)
Total Protein: 9.2 g/dL — ABNORMAL HIGH (ref 6.5–8.1)

## 2024-10-06 LAB — BASIC METABOLIC PANEL WITH GFR
Anion gap: 10 (ref 5–15)
BUN: 25 mg/dL — ABNORMAL HIGH (ref 6–20)
CO2: 22 mmol/L (ref 22–32)
Calcium: 9 mg/dL (ref 8.9–10.3)
Chloride: 102 mmol/L (ref 98–111)
Creatinine, Ser: 1.48 mg/dL — ABNORMAL HIGH (ref 0.61–1.24)
GFR, Estimated: >60 mL/min
Glucose, Bld: 104 mg/dL — ABNORMAL HIGH (ref 70–99)
Potassium: 4.5 mmol/L (ref 3.5–5.1)
Sodium: 135 mmol/L (ref 135–145)

## 2024-10-06 LAB — CBC
HCT: 30.5 % — ABNORMAL LOW (ref 39.0–52.0)
Hemoglobin: 9.9 g/dL — ABNORMAL LOW (ref 13.0–17.0)
MCH: 30.7 pg (ref 26.0–34.0)
MCHC: 32.5 g/dL (ref 30.0–36.0)
MCV: 94.4 fL (ref 80.0–100.0)
Platelets: 288 K/uL (ref 150–400)
RBC: 3.23 MIL/uL — ABNORMAL LOW (ref 4.22–5.81)
RDW: 13 % (ref 11.5–15.5)
WBC: 4.5 K/uL (ref 4.0–10.5)
nRBC: 0 % (ref 0.0–0.2)

## 2024-10-06 LAB — ETHANOL: Alcohol, Ethyl (B): <15 mg/dL

## 2024-10-06 MED ORDER — HYDROXYZINE HCL 25 MG PO TABS
25.0000 mg | ORAL_TABLET | Freq: Once | ORAL | Status: AC
  Administered 2024-10-06: 25 mg via ORAL
  Filled 2024-10-06: qty 1

## 2024-10-06 MED ORDER — LACTATED RINGERS IV BOLUS
1000.0000 mL | Freq: Once | INTRAVENOUS | Status: AC
  Administered 2024-10-06: 1000 mL via INTRAVENOUS
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
## 2024-10-06 NOTE — ED Notes (Signed)
 Pt given breakfast sandwich and ginger ale, pt asked for jelly and advised we did not have any jelly.

## 2024-10-06 NOTE — ED Notes (Signed)
 Notified provider that lab called to notify re-collection needed on urine d/t spillage en route via tube station.  Provider okay with notification

## 2024-10-06 NOTE — ED Notes (Signed)
 Patient seen walking out of department before getting discharge papers.

## 2024-10-06 NOTE — ED Notes (Signed)
 Went to patient's bedside to start IV, noticed 2 unknown pills at bedside, when patient asked what they were, pt stated something I know I shouldn't take, so I didn't.  Pt made aware that these would have to be confiscated.  Pt verbalized understanding and cordial.  Proceeded with care.  Notified security of finding, [place] and other PSO to bedside.  Pt asked by primary RN if okay to search belongings, pt refused.  Verbalized understanding and discarded unknown pills with Bre, NT as witness
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
## 2024-10-06 NOTE — ED Notes (Signed)
 Pt asking this nurse where her breakfast tray was, this nurse informed pt that she only receives a breakfast tray if she is admitted into the hospital and she is not currently admitted. This nurse informed pt I could get her a sausage biscuit and pt advised that she had already called the kitchen and ordered a breakfast tray and again this nurse informed her that unless she is admitted she does not get a breakfast tray, the pt stated her nurse told her she got a breakfast tray, this nurse informed her that I was her nurse and that I could again get her a breakfast sandwich and drink, pt agreed to this.

## 2024-10-06 NOTE — Discharge Instructions (Signed)
 Sure you are drinking plenty of water .  Follow-up with your primary care doctor.

## 2024-10-06 NOTE — ED Provider Notes (Signed)
 Accepted handoff at shift change from [employer]. Please see prior provider note for more detail.   Briefly: Patient is 29 y.o.   Male has been observed in the emergency department for 6-1/2 hours.  He has ingested meth according to prior provider.  Was somewhat anxious and dehydrated  DDX: concern for AKI  Plan: Repeat BMP to trend creatinine after IV fluids       Physical Exam  BP 107/70 (BP Location: Left Arm)   Pulse 68   Temp 98.4 F (36.9 C) (Oral)   Resp 16   SpO2 98%   Physical Exam  Procedures  Procedures  ED Course / MDM    Medical Decision Making Amount and/or Complexity of Data Reviewed Labs: ordered.  Risk Prescription drug management.   Initial CMP showed creatinine of 1.7 4 repeat shows 1.48 with improvement with just 1 L of crystalloid.  I recommend the patient follow-up with primary care in the next 5 to 7 days for recheck.  Encouraged hydration and methamphetamine cessation.       Neldon Inoue [place], [place] 10/06/24 9044    Armenta Canning, MD 10/09/24 [phone]

## 2024-10-15 ENCOUNTER — Encounter (HOSPITAL_COMMUNITY): Payer: Self-pay | Admitting: Emergency Medicine

## 2024-10-15 ENCOUNTER — Observation Stay (HOSPITAL_COMMUNITY)
Admission: EM | Admit: 2024-10-15 | Discharge: 2024-10-17 | Disposition: A | Discharge status: Home / Self Care | Attending: Internal Medicine | Admitting: Internal Medicine

## 2024-10-15 ENCOUNTER — Emergency Department (HOSPITAL_COMMUNITY)

## 2024-10-15 ENCOUNTER — Other Ambulatory Visit: Payer: Self-pay

## 2024-10-15 ENCOUNTER — Other Ambulatory Visit (HOSPITAL_COMMUNITY): Payer: Self-pay

## 2024-10-15 ENCOUNTER — Ambulatory Visit: Payer: Self-pay | Admitting: Family

## 2024-10-15 DIAGNOSIS — K567 Ileus, unspecified: Secondary | ICD-10-CM | POA: Diagnosis not present

## 2024-10-15 DIAGNOSIS — R112 Nausea with vomiting, unspecified: Principal | ICD-10-CM

## 2024-10-15 DIAGNOSIS — D509 Iron deficiency anemia, unspecified: Secondary | ICD-10-CM | POA: Insufficient documentation

## 2024-10-15 DIAGNOSIS — F17210 Nicotine dependence, cigarettes, uncomplicated: Secondary | ICD-10-CM | POA: Insufficient documentation

## 2024-10-15 DIAGNOSIS — B20 Human immunodeficiency virus [HIV] disease: Secondary | ICD-10-CM | POA: Insufficient documentation

## 2024-10-15 LAB — COMPREHENSIVE METABOLIC PANEL WITH GFR
ALT: 27 U/L (ref 0–44)
AST: 37 U/L (ref 15–41)
Albumin: 4.4 g/dL (ref 3.5–5.0)
Alkaline Phosphatase: 101 U/L (ref 38–126)
Anion gap: 11 (ref 5–15)
BUN: 16 mg/dL (ref 6–20)
CO2: 17 mmol/L — ABNORMAL LOW (ref 22–32)
Calcium: 9.4 mg/dL (ref 8.9–10.3)
Chloride: 106 mmol/L (ref 98–111)
Creatinine, Ser: 1.15 mg/dL (ref 0.61–1.24)
GFR, Estimated: >60 mL/min
Glucose, Bld: 90 mg/dL (ref 70–99)
Potassium: 4.7 mmol/L (ref 3.5–5.1)
Sodium: 134 mmol/L — ABNORMAL LOW (ref 135–145)
Total Bilirubin: 0.4 mg/dL (ref 0.0–1.2)
Total Protein: 10.6 g/dL — ABNORMAL HIGH (ref 6.5–8.1)

## 2024-10-15 LAB — CBC WITH DIFFERENTIAL/PLATELET
Abs Immature Granulocytes: 0.03 K/uL (ref 0.00–0.07)
Basophils Absolute: 0 K/uL (ref 0.0–0.1)
Basophils Relative: 0 %
Eosinophils Absolute: 2.8 K/uL — ABNORMAL HIGH (ref 0.0–0.5)
Eosinophils Relative: 27 %
HCT: 38.9 % — ABNORMAL LOW (ref 39.0–52.0)
Hemoglobin: 12.5 g/dL — ABNORMAL LOW (ref 13.0–17.0)
Immature Granulocytes: 0 %
Lymphocytes Relative: 30 %
Lymphs Abs: 3.1 K/uL (ref 0.7–4.0)
MCH: 31.1 pg (ref 26.0–34.0)
MCHC: 32.1 g/dL (ref 30.0–36.0)
MCV: 96.8 fL (ref 80.0–100.0)
Monocytes Absolute: 0.6 K/uL (ref 0.1–1.0)
Monocytes Relative: 6 %
Neutro Abs: 3.8 K/uL (ref 1.7–7.7)
Neutrophils Relative %: 37 %
Platelets: 274 K/uL (ref 150–400)
RBC: 4.02 MIL/uL — ABNORMAL LOW (ref 4.22–5.81)
RDW: 14.2 % (ref 11.5–15.5)
Smear Review: NORMAL
WBC: 10.4 K/uL (ref 4.0–10.5)
nRBC: 0 % (ref 0.0–0.2)

## 2024-10-15 LAB — RESP PANEL BY RT-PCR (RSV, FLU A&B, COVID)  RVPGX2
Influenza A by PCR: NEGATIVE
Influenza B by PCR: NEGATIVE
Resp Syncytial Virus by PCR: NEGATIVE
SARS Coronavirus 2 by RT PCR: NEGATIVE

## 2024-10-15 LAB — LIPASE, BLOOD: Lipase: 14 U/L (ref 11–51)

## 2024-10-15 MED ORDER — SODIUM CHLORIDE 0.9 % IV BOLUS
1000.0000 mL | Freq: Once | INTRAVENOUS | Status: AC
  Administered 2024-10-15: 1000 mL via INTRAVENOUS

## 2024-10-15 MED ORDER — ONDANSETRON HCL 4 MG/2ML IJ SOLN
4.0000 mg | Freq: Once | INTRAMUSCULAR | Status: AC
  Administered 2024-10-15: 4 mg via INTRAVENOUS
  Filled 2024-10-15: qty 2

## 2024-10-15 MED ORDER — MORPHINE SULFATE (PF) 4 MG/ML IV SOLN
4.0000 mg | Freq: Once | INTRAVENOUS | Status: AC
  Administered 2024-10-15: 4 mg via INTRAVENOUS
  Filled 2024-10-15 (×2): qty 1

## 2024-10-15 MED ORDER — HYDROMORPHONE HCL 1 MG/ML IJ SOLN: 0.5000 mg | INTRAMUSCULAR | Status: DC | PRN

## 2024-10-15 MED ORDER — LACTATED RINGERS IV BOLUS: 1000.0000 mL | Freq: Once | INTRAVENOUS | Status: DC

## 2024-10-15 MED ORDER — VITAMIN B-12 1000 MCG PO TABS
1000.0000 ug | ORAL_TABLET | Freq: Every day | ORAL | Status: DC
  Administered 2024-10-15 – 2024-10-16 (×2): 1000 ug via ORAL
  Filled 2024-10-15 (×2): qty 1

## 2024-10-15 MED ORDER — ENOXAPARIN SODIUM 40 MG/0.4ML IJ SOSY
40.0000 mg | PREFILLED_SYRINGE | INTRAMUSCULAR | Status: DC
  Administered 2024-10-15: 40 mg via SUBCUTANEOUS
  Filled 2024-10-15 (×2): qty 0.4

## 2024-10-15 MED ORDER — PANTOPRAZOLE SODIUM 40 MG IV SOLR
40.0000 mg | Freq: Once | INTRAVENOUS | Status: AC
  Administered 2024-10-15: 40 mg via INTRAVENOUS
  Filled 2024-10-15: qty 10

## 2024-10-15 MED ORDER — ALBUTEROL SULFATE (2.5 MG/3ML) 0.083% IN NEBU: 2.5000 mg | INHALATION_SOLUTION | RESPIRATORY_TRACT | Status: DC | PRN

## 2024-10-15 MED ORDER — IOHEXOL 300 MG/ML  SOLN
100.0000 mL | Freq: Once | INTRAMUSCULAR | Status: AC | PRN
  Administered 2024-10-15: 100 mL via INTRAVENOUS

## 2024-10-15 MED ORDER — FERROUS SULFATE 325 (65 FE) MG PO TABS
325.0000 mg | ORAL_TABLET | Freq: Every day | ORAL | Status: DC
  Administered 2024-10-15 – 2024-10-16 (×2): 325 mg via ORAL
  Filled 2024-10-15 (×2): qty 1

## 2024-10-15 MED ORDER — SULFAMETHOXAZOLE-TRIMETHOPRIM 400-80 MG PO TABS
1.0000 | ORAL_TABLET | Freq: Every day | ORAL | Status: DC
  Administered 2024-10-15 – 2024-10-16 (×2): 1 via ORAL
  Filled 2024-10-15 (×3): qty 1

## 2024-10-15 MED ORDER — ACETAMINOPHEN 650 MG RE SUPP: 650.0000 mg | Freq: Four times a day (QID) | RECTAL | Status: DC | PRN

## 2024-10-15 MED ORDER — MORPHINE SULFATE (PF) 4 MG/ML IV SOLN
4.0000 mg | Freq: Once | INTRAVENOUS | Status: AC
  Administered 2024-10-15: 4 mg via INTRAVENOUS
  Filled 2024-10-15: qty 1

## 2024-10-15 MED ORDER — LACTATED RINGERS IV SOLN: INTRAVENOUS | Status: AC

## 2024-10-15 MED ORDER — ACETAMINOPHEN 325 MG PO TABS: 650.0000 mg | ORAL_TABLET | Freq: Four times a day (QID) | ORAL | Status: DC | PRN

## 2024-10-15 MED ORDER — ONDANSETRON HCL 4 MG PO TABS: 4.0000 mg | ORAL_TABLET | Freq: Four times a day (QID) | ORAL | Status: DC | PRN

## 2024-10-15 MED ORDER — ONDANSETRON HCL 4 MG/2ML IJ SOLN: 4.0000 mg | Freq: Four times a day (QID) | INTRAMUSCULAR | Status: DC | PRN

## 2024-10-15 NOTE — ED Notes (Signed)
 Pt was given urinal and informed that there is a need for urine.

## 2024-10-15 NOTE — ED Provider Notes (Signed)
" °  Physical Exam   Vitals:   10/15/24 0222  BP: 112/79  Pulse: 94  Resp: 18  Temp: 97.7 F (36.5 C)  TempSrc: Oral  SpO2: 100%    Physical Exam  Procedures  Procedures  ED Course / MDM    Medical Decision Making Amount and/or Complexity of Data Reviewed Labs: ordered. Radiology: ordered.  Risk Prescription drug management. Decision regarding hospitalization.   Patient received at shift change from prior EDP [facility], see their note for initial history/physical exam findings/lab and imaging interpretation/assessment and plan.  29 year old patient with a history of noncompliant AIDS/HIV off of Symtuza  presenting with severe lower abdominal pain/nausea/vomiting/diarrhea x 24 hours as well as mild cough.  Notable labs: CBC with white blood cell count of 10.4, hemoglobin of 12.5 is improved from previous CMP with borderline hyponatremia with sodium of 134 Lipase within normal limits Respiratory viral panel negative  CT abdomen/pelvis:  1. Small bowel dilatation up to 3.5 cm in the mid/lower abdomen, possibly due to a low-grade obstruction versus ileus. No discrete transition identified. 2. Thickened jejunal folds consistent with nonspecific enteritis. 3. Fluid throughout the colon, favoring nonspecific ileocolitis. 4. Mild gastric dilatation with air and fluid. 5. New opacities in the posterior medial left lower lobe base, possibly due to scarring and atelectasis versus pneumonic infiltrate or aspiration.  Findings consistent with low-grade obstruction versus ileus as well as nonspecific findings suggestive of enteritis versus ileocolitis, concern for possible lung scarring/atelectasis/infiltrate/aspiration as well.  Patient was admitted to the hospital 12/27 through 1/2 with sepsis/AIDS/multifocal pneumonia.  Given patient's high risk comorbidities and noncompliance with antiviral medications as well as findings on imaging as above, they would likely benefit  from admission as above.  Pending hospitalist consult for admission.  I spoke with Dr. Debby with the hospitalist service who agrees that this patient is appropriate for admission.        [name] [name] SAILOR, [place] 10/15/24 9350    Melvenia Motto, MD 10/15/24 [phone]  "

## 2024-10-15 NOTE — ED Notes (Signed)
 Patient aware urine is still needed urinal at bedside.

## 2024-10-15 NOTE — ED Provider Notes (Signed)
 " [place] EMERGENCY DEPARTMENT AT [facility] Provider Note   CSN: [id number] Arrival date & time: 10/15/24  0214     Patient presents with: Diarrhea   [name] [name] is a 29 y.o. adult with PMHx AIDS/HIV noncompliant on anti-viral medicines, seizures who presents ED concern for severe lower abdominal pain, nausea, vomiting, diarrhea over the past 24 hours. Patient also with a cough recently - denies chest pain or SOB. States that cough today feels a lot more mild than their PNA cough that they had a couple months ago. Denies fever. Denies dysuria or hematuria. Denies hematochezia. Endorses seeing red in their emesis today.   Patient stating that they are not compliant on anti-viral medications because they do not have a provider that can prescribe it.    Diarrhea      Prior to Admission medications  Medication Sig Start Date End Date Taking? Authorizing Provider  cyanocobalamin  1000 MCG tablet Take 1 tablet (1,000 mcg total) by mouth daily. 09/27/24 12/26/24  Jadine Toribio SQUIBB, MD  ferrous sulfate  325 (65 FE) MG tablet Take 1 tablet (325 mg total) by mouth daily with breakfast. 09/27/24   Jadine Toribio SQUIBB, MD  fluconazole  (DIFLUCAN ) 200 MG tablet Take 1 tablet (200 mg total) by mouth daily. 09/27/24   Jadine Toribio SQUIBB, MD  ondansetron  (ZOFRAN -ODT) 4 MG disintegrating tablet Take 1 tablet (4 mg total) by mouth every 8 (eight) hours as needed for nausea or vomiting. Patient not taking: Reported on 09/23/2024 08/05/24   Beverley Doffing A, PA-C  sulfamethoxazole -trimethoprim  (BACTRIM ) 400-80 MG tablet Take 1 tablet by mouth daily. 09/27/24   Jadine Toribio SQUIBB, MD  SYMTUZA  800-150-200-10 MG TABS Take 1 tablet by mouth daily. 09/24/24   Overton Constance DASEN, MD    Allergies: Patient has no known allergies.    Review of Systems  Gastrointestinal:  Positive for diarrhea.    Updated Vital Signs BP 112/79   Pulse 94   Temp 97.7 F (36.5 C) (Oral)   Resp 18   SpO2 100%   Physical  Exam Vitals and nursing note reviewed.  Constitutional:      General: She is not in acute distress.    Appearance: She is not ill-appearing or toxic-appearing.  HENT:     Head: Normocephalic and atraumatic.     Mouth/Throat:     Mouth: Mucous membranes are moist.  Eyes:     General: No scleral icterus.       Right eye: No discharge.        Left eye: No discharge.     Conjunctiva/sclera: Conjunctivae normal.  Cardiovascular:     Rate and Rhythm: Normal rate and regular rhythm.     Pulses: Normal pulses.     Heart sounds: Normal heart sounds. No murmur heard. Pulmonary:     Effort: Pulmonary effort is normal. No respiratory distress.     Breath sounds: Normal breath sounds. No wheezing, rhonchi or rales.  Abdominal:     General: Abdomen is flat. Bowel sounds are normal. There is no distension.     Palpations: Abdomen is soft. There is no mass.     Tenderness: There is abdominal tenderness.     Comments: Lower abdominal tenderness  Musculoskeletal:     Right lower leg: No edema.     Left lower leg: No edema.  Skin:    General: Skin is warm and dry.     Findings: No rash.  Neurological:     General: No  focal deficit present.     Mental Status: She is alert and oriented to person, place, and time. Mental status is at baseline.  Psychiatric:        Mood and Affect: Mood normal.        Behavior: Behavior normal.     (all labs ordered are listed, but only abnormal results are displayed) Labs Reviewed  COMPREHENSIVE METABOLIC PANEL WITH GFR - Abnormal; Notable for the following components:      Result Value   Sodium 134 (*)    CO2 17 (*)    Total Protein 10.6 (*)    All other components within normal limits  CBC WITH DIFFERENTIAL/PLATELET - Abnormal; Notable for the following components:   RBC 4.02 (*)    Hemoglobin 12.5 (*)    HCT 38.9 (*)    Eosinophils Absolute 2.8 (*)    All other components within normal limits  RESP PANEL BY RT-PCR (RSV, FLU A&B, COVID)  RVPGX2   LIPASE, BLOOD  URINALYSIS, ROUTINE W REFLEX MICROSCOPIC  PATHOLOGIST SMEAR REVIEW    EKG: None  Radiology: CT ABDOMEN PELVIS W CONTRAST Result Date: 10/15/2024 EXAM: CT ABDOMEN AND PELVIS WITH CONTRAST 10/15/2024 04:05:15 AM TECHNIQUE: CT of the abdomen and pelvis was performed with the administration of 100 mL (iohexol  (OMNIPAQUE ) 300 MG/ML solution 100 mL IOHEXOL  300 MG/ML SOLN) intravenous contrast. Multiplanar reformatted images are provided for review. Automated exposure control, iterative reconstruction, and/or weight-based adjustment of the mA/kV was utilized to reduce the radiation dose to as low as reasonably achievable. COMPARISON: CT with oral contrast only, abdomen and pelvis 05/24/2017, CT pelvis with IV contrast 05/08/2023. CLINICAL HISTORY: Abdominal pain, acute, nonlocalized. FINDINGS: LOWER CHEST: There is increased linear scarring or atelectasis in the right middle lobe. In the posterior medial left lower lobe base there are new opacities which could be due to scarring and atelectasis versus pneumonic infiltrate or aspiration. The cardiac size is normal. The lung bases are clear elsewhere. LIVER: The liver is mildly steatotic without evidence of mass. GALLBLADDER AND BILE DUCTS: Gallbladder is unremarkable. No biliary ductal dilatation. SPLEEN: No acute abnormality. PANCREAS: No acute abnormality. ADRENAL GLANDS: No adrenal mass. KIDNEYS, URETERS AND BLADDER: There is no renal mass or stone. No hydronephrosis. No perinephric or periureteral stranding. Urinary bladder is unremarkable. GI AND BOWEL: There is mild gastric dilatation with air and fluid. There are thickened jejunal folds consistent with nonspecific enteritis. There is dilatation in the mid to lower abdominal small bowel up to 3.5 cm, in the lower abdomen with dilated interspersed with decompressed small bowel and no discrete transition identifiable. The appendix is normal. There is fluid in the colon. The small bowel  dilatation could be due to a low-grade obstruction. Other findings with fluid throughout the colon favor nonspecific ileocolitis with ileus . There are no mesenteric inflammatory changes. PERITONEUM AND RETROPERITONEUM: No free fluid, free air, free hemorrhage, or localized collections. VASCULATURE: Aorta is normal in caliber. LYMPH NODES: No lymphadenopathy. REPRODUCTIVE ORGANS: No acute abnormality. BONES AND SOFT TISSUES: No acute osseous abnormality. No focal soft tissue abnormality. IMPRESSION: 1. Small bowel dilatation up to 3.5 cm in the mid/lower abdomen, possibly due to a low-grade obstruction versus ileus. No discrete transition identified. 2. Thickened jejunal folds consistent with nonspecific enteritis. 3. Fluid throughout the colon, favoring nonspecific ileocolitis. 4. Mild gastric dilatation with air and fluid. 5. New opacities in the posterior medial left lower lobe base, possibly due to scarring and atelectasis versus pneumonic infiltrate or aspiration. Electronically  signed by: Francis Quam MD 10/15/2024 04:27 AM EST RP Workstation: [id number]   DG Chest 2 View Result Date: 10/15/2024 EXAM: 2 VIEW(S) XRAY OF THE CHEST 10/15/2024 02:54:00 AM COMPARISON: 09/21/2024 CLINICAL HISTORY: cough FINDINGS: LUNGS AND PLEURA: No focal pulmonary opacity. No pleural effusion. No pneumothorax. HEART AND MEDIASTINUM: No acute abnormality of the cardiac and mediastinal silhouettes. BONES AND SOFT TISSUES: Bilateral nipple shadows. No acute osseous abnormality. IMPRESSION: 1. No acute cardiopulmonary abnormality. Electronically signed by: Pinkie Pebbles MD 10/15/2024 03:07 AM EST RP Workstation: [id number]     Procedures   Medications Ordered in the ED  morphine  (PF) 4 MG/ML injection 4 mg (has no administration in time range)  ondansetron  (ZOFRAN ) injection 4 mg (has no administration in time range)  ondansetron  (ZOFRAN ) injection 4 mg (4 mg Intravenous Given 10/15/24 0356)  morphine  (PF) 4 MG/ML  injection 4 mg (4 mg Intravenous Given 10/15/24 0356)  pantoprazole  (PROTONIX ) injection 40 mg (40 mg Intravenous Given 10/15/24 0356)  sodium chloride  0.9 % bolus 1,000 mL (1,000 mLs Intravenous New Bag/Given 10/15/24 0358)  iohexol  (OMNIPAQUE ) 300 MG/ML solution 100 mL (100 mLs Intravenous Contrast Given 10/15/24 0359)                                    Medical Decision Making Amount and/or Complexity of Data Reviewed Labs: ordered. Radiology: ordered.  Risk Prescription drug management.    This patient presents to the ED for concern of abdominal pain, this involves an extensive number of treatment options, and is a complaint that carries with it a high risk of complications and morbidity.  The differential diagnosis includes gastroenteritis, colitis, small bowel obstruction, appendicitis, cholecystitis, pancreatitis, nephrolithiasis, UTI, pyelonephritis   Co morbidities that complicate the patient evaluation  AIDS/HIV noncompliant on anti-viral medicines, seizures    Additional history obtained:  No PCP listed in chart. Recent ED notes reviewed.   Problem List / ED Course / Critical interventions / Medication management  Patient presented for abdominal pain, nausea, vomiting, diarrhea over the past 24 hours.  Physical exam with lower abdominal tenderness to palpation.  Rest of physical exam reassuring.  Patient afebrile with stable vitals. I Ordered, and personally interpreted labs.  CBC with mild anemia with hemoglobin at 12.5.  No leukocytosis.  CMP with mild hyponatremia at 134.  CO2 is also low at 17.  Lipase within normal limits.  Respiratory panel negative.  UA pending. I ordered imaging studies including CT Abd/Pelvis with contrast and chest x-ray. I independently visualized and interpreted imaging and I agree with the radiologist interpretation of low-grade obstruction vs ileus. Shared all results with patient.  Answered all questions. Provided patient with IV fluids,  morphine , Zofran , Protonix  - patient continues to feel very ill and does not feel safe being discharged home at this point. Patient requesting admission which I feel is reasonable. I have reviewed the patients home medicines and have made adjustments as needed     Social Determinants of Health:  none   6:30AM Care of [name] [name] transferred to [facility] at the end of my shift as the patient will require reassessment once labs/imaging have resulted. Patient presentation, ED course, and plan of care discussed with review of all pertinent labs and imaging. Please see his/her note for further details regarding further ED course and disposition. Plan at time of handoff is admit patient for SBO vs ileus. This may be altered or  completely changed at the discretion of the oncoming team pending results of further workup.      Final diagnoses:  Nausea vomiting and diarrhea    ED Discharge Orders     None          Hoy Nidia FALCON, [place] 10/15/24 9365    Melvenia Motto, MD 10/15/24 [phone]  "

## 2024-10-15 NOTE — Plan of Care (Signed)

## 2024-10-15 NOTE — H&P (Signed)
 " History and Physical  [name] [name] FMW:[id number] DOB: [birth year] DOA: 10/15/2024  PCP: Patient, No Pcp Per   Chief Complaint: Abdominal pain, vomiting  HPI: [name] [name] is a 29 y.o. adult biological male transitioned to male with medical history significant for HIV disease, substance abuse including amphetamines, medication noncompliance recently hospitalized for community-acquired pneumonia being admitted to the hospital with 1 day of abdominal pain and vomiting found to have an ileus.  They tell me that they have done well since their last hospital discharge, took their antibiotics and antifungal medications.  No fevers or chills, was essentially asymptomatic.  However, they did not get their HIV medications or Bactrim  filled prior to being discharged from the hospital, went to their pharmacy at [facility] and was unable to get the medications filled as well.  In any case, they were doing well until yesterday, when there was sudden onset of abdominal pain crampy in nature, nausea and vomiting.  Has never had this problem before.  Had a normal bowel movement 2 days ago, since yesterday has just had small amounts of liquid stool coming out.  Has not had any flatus this morning, still having abdominal pain and is not hungry.  Review of Systems: Please see HPI for pertinent positives and negatives. A complete 10 system review of systems are otherwise negative.  Past Medical History:  Diagnosis Date   Acute kidney failure 04/2017   Anal fissure 2017   C. 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   Genital HSV 2017   Hepatitis B immune    HIV disease (HCC)    dx'ed in 2013   Immune deficiency disorder    Low grade squamous intraepith lesion on cytologic smear anus (lgsil) 2013   Rectal gonorrhea    multiple episodes   Seizures (HCC) 2017   probably stress  related (05/24/2017)   Syphilis    Syphilis, secondary 11/2011   tx'ed with IM PCN x 3   Past Surgical History:  Procedure Laterality Date   ANAL EXAMINATION UNDER ANESTHESIA     INCISION AND DRAINAGE ABSCESS Left 02/16/2019   Procedure: INCISION AND DRAINAGE ABSCESS;  Surgeon: Carlie Clark, MD;  Location: [facility] OR;  Service: ENT;  Laterality: Left;   WISDOM TOOTH EXTRACTION     Social History:  reports that she has been smoking cigarettes. She has a 0.6 pack-year smoking history. She has never used smokeless tobacco. She reports current alcohol use of about 70.0 standard drinks of alcohol per week. She reports that she does not currently use drugs after having used the following drugs: Marijuana, Cocaine, MDMA (Ecstacy), and Methamphetamines. Frequency: 7.00 times per week.  Allergies[1]  Family History  Problem Relation Age of Onset   Hypertension Maternal Grandmother      Prior to Admission medications  Medication Sig Start Date End Date Taking? Authorizing Provider  cyanocobalamin  1000 MCG tablet Take 1 tablet (1,000 mcg total) by mouth daily. 09/27/24 12/26/24  Jadine Toribio SQUIBB, MD  ferrous sulfate  325 (65 FE) MG tablet Take 1 tablet (325 mg total) by mouth daily with breakfast. 09/27/24   Jadine Toribio SQUIBB, MD  fluconazole  (DIFLUCAN ) 200 MG tablet Take 1 tablet (200 mg total) by mouth daily. 09/27/24   Jadine Toribio SQUIBB, MD  ondansetron  (ZOFRAN -ODT) 4 MG disintegrating tablet Take 1 tablet (4 mg total) by mouth every 8 (  eight) hours as needed for nausea or vomiting. Patient not taking: Reported on 09/23/2024 08/05/24   Beverley Doffing A, PA-C  sulfamethoxazole -trimethoprim  (BACTRIM ) 400-80 MG tablet Take 1 tablet by mouth daily. 09/27/24   Jadine Toribio SQUIBB, MD  SYMTUZA  800-150-200-10 MG TABS Take 1 tablet by mouth daily. 09/24/24   Overton Constance DASEN, MD    Physical Exam: BP 116/73   Pulse 77   Temp 98 F (36.7 C) (Oral)   Resp 16   SpO2 97%  General:  Alert, oriented, calm, in no acute  distress  Eyes: EOMI, clear conjuctivae, white sclerea Neck: supple, no masses, trachea mildline  Cardiovascular: RRR, no murmurs or rubs, no peripheral edema  Respiratory: clear to auscultation bilaterally, no wheezes, no crackles  Abdomen: soft, diffusely tender especially in the bilateral lower quadrants, slightly distended, slow bowel tones heard  Skin: dry, no rashes  Musculoskeletal: no joint effusions, normal range of motion  Psychiatric: appropriate affect, normal speech  Neurologic: extraocular muscles intact, clear speech, moving all extremities with intact sensorium         Labs on Admission:  Basic Metabolic Panel: Recent Labs  Lab 10/15/24 0245  NA 134*  K 4.7  CL 106  CO2 17*  GLUCOSE 90  BUN 16  CREATININE 1.15  CALCIUM 9.4   Liver Function Tests: Recent Labs  Lab 10/15/24 0245  AST 37  ALT 27  ALKPHOS 101  BILITOT 0.4  PROT 10.6*  ALBUMIN 4.4   Recent Labs  Lab 10/15/24 0245  LIPASE 14   No results for input(s): AMMONIA in the last 168 hours. CBC: Recent Labs  Lab 10/15/24 0245  WBC 10.4  NEUTROABS 3.8  HGB 12.5*  HCT 38.9*  MCV 96.8  PLT 274   Cardiac Enzymes: No results for input(s): CKTOTAL, CKMB, CKMBINDEX, TROPONINI in the last 168 hours. BNP (last 3 results) No results for input(s): BNP in the last 8760 hours.  ProBNP (last 3 results) No results for input(s): PROBNP in the last 8760 hours.  CBG: No results for input(s): GLUCAP in the last 168 hours.  Radiological Exams on Admission: CT ABDOMEN PELVIS W CONTRAST Result Date: 10/15/2024 EXAM: CT ABDOMEN AND PELVIS WITH CONTRAST 10/15/2024 04:05:15 AM TECHNIQUE: CT of the abdomen and pelvis was performed with the administration of 100 mL (iohexol  (OMNIPAQUE ) 300 MG/ML solution 100 mL IOHEXOL  300 MG/ML SOLN) intravenous contrast. Multiplanar reformatted images are provided for review. Automated exposure control, iterative reconstruction, and/or weight-based  adjustment of the mA/kV was utilized to reduce the radiation dose to as low as reasonably achievable. COMPARISON: CT with oral contrast only, abdomen and pelvis 05/24/2017, CT pelvis with IV contrast 05/08/2023. CLINICAL HISTORY: Abdominal pain, acute, nonlocalized. FINDINGS: LOWER CHEST: There is increased linear scarring or atelectasis in the right middle lobe. In the posterior medial left lower lobe base there are new opacities which could be due to scarring and atelectasis versus pneumonic infiltrate or aspiration. The cardiac size is normal. The lung bases are clear elsewhere. LIVER: The liver is mildly steatotic without evidence of mass. GALLBLADDER AND BILE DUCTS: Gallbladder is unremarkable. No biliary ductal dilatation. SPLEEN: No acute abnormality. PANCREAS: No acute abnormality. ADRENAL GLANDS: No adrenal mass. KIDNEYS, URETERS AND BLADDER: There is no renal mass or stone. No hydronephrosis. No perinephric or periureteral stranding. Urinary bladder is unremarkable. GI AND BOWEL: There is mild gastric dilatation with air and fluid. There are thickened jejunal folds consistent with nonspecific enteritis. There is dilatation in the mid to lower  abdominal small bowel up to 3.5 cm, in the lower abdomen with dilated interspersed with decompressed small bowel and no discrete transition identifiable. The appendix is normal. There is fluid in the colon. The small bowel dilatation could be due to a low-grade obstruction. Other findings with fluid throughout the colon favor nonspecific ileocolitis with ileus . There are no mesenteric inflammatory changes. PERITONEUM AND RETROPERITONEUM: No free fluid, free air, free hemorrhage, or localized collections. VASCULATURE: Aorta is normal in caliber. LYMPH NODES: No lymphadenopathy. REPRODUCTIVE ORGANS: No acute abnormality. BONES AND SOFT TISSUES: No acute osseous abnormality. No focal soft tissue abnormality. IMPRESSION: 1. Small bowel dilatation up to 3.5 cm in the  mid/lower abdomen, possibly due to a low-grade obstruction versus ileus. No discrete transition identified. 2. Thickened jejunal folds consistent with nonspecific enteritis. 3. Fluid throughout the colon, favoring nonspecific ileocolitis. 4. Mild gastric dilatation with air and fluid. 5. New opacities in the posterior medial left lower lobe base, possibly due to scarring and atelectasis versus pneumonic infiltrate or aspiration. Electronically signed by: Francis Quam MD 10/15/2024 04:27 AM EST RP Workstation: [id number]   DG Chest 2 View Result Date: 10/15/2024 EXAM: 2 VIEW(S) XRAY OF THE CHEST 10/15/2024 02:54:00 AM COMPARISON: 09/21/2024 CLINICAL HISTORY: cough FINDINGS: LUNGS AND PLEURA: No focal pulmonary opacity. No pleural effusion. No pneumothorax. HEART AND MEDIASTINUM: No acute abnormality of the cardiac and mediastinal silhouettes. BONES AND SOFT TISSUES: Bilateral nipple shadows. No acute osseous abnormality. IMPRESSION: 1. No acute cardiopulmonary abnormality. Electronically signed by: Pinkie Pebbles MD 10/15/2024 03:07 AM EST RP Workstation: [id number]   Assessment/Plan KEEDAN SAMPLE is a 29 y.o. adult biological male transitioned to male with medical history significant for HIV disease, substance abuse including amphetamines, medication noncompliance recently hospitalized for community-acquired pneumonia being admitted to the hospital with 1 day of abdominal pain and vomiting found to have an ileus.   Ileus-with abdominal pain, nausea.  Unclear etiology.  Thickened jejunal folds also showed nonspecific enteritis, however patient without infectious symptoms. -Observation admission -N.p.o. except for ice chips and sips with meds -IV fluids -Pain and nausea medication as needed -Given history of untreated HIV, and nonspecific enteritis on CT will discuss any further intervention with ID  HIV-continue Bactrim  at prophylactic dose, patient has been noncompliant with Symtuza  which was  prescribed at discharge  Chronic iron deficiency anemia-continue iron supplementation  DVT prophylaxis: Lovenox      Code Status: Full Code  Consults called: ID  Admission status: Observation  Time spent: 49 minutes  Jarelly Rinck CHRISTELLA Gail MD [facility] Pager [phone]  If 7PM-7AM, please contact night-coverage [email or web address] Password [facility]  10/15/2024, 7:38 AM      [1] No Known Allergies  "

## 2024-10-15 NOTE — ED Triage Notes (Signed)
 Pt BIBA from church for N/V/D for 1 day, hasn't been able to keep anything down since breakfast. Pt rates ABD pain 10/10.  PTA CBG 119

## 2024-10-16 ENCOUNTER — Encounter (HOSPITAL_COMMUNITY): Payer: Self-pay | Admitting: Internal Medicine

## 2024-10-16 DIAGNOSIS — K567 Ileus, unspecified: Secondary | ICD-10-CM | POA: Diagnosis not present

## 2024-10-16 LAB — BASIC METABOLIC PANEL WITH GFR
Anion gap: 9 (ref 5–15)
BUN: 15 mg/dL (ref 6–20)
CO2: 19 mmol/L — ABNORMAL LOW (ref 22–32)
Calcium: 9 mg/dL (ref 8.9–10.3)
Chloride: 107 mmol/L (ref 98–111)
Creatinine, Ser: 1.13 mg/dL (ref 0.61–1.24)
GFR, Estimated: >60 mL/min
Glucose, Bld: 91 mg/dL (ref 70–99)
Potassium: 4.3 mmol/L (ref 3.5–5.1)
Sodium: 134 mmol/L — ABNORMAL LOW (ref 135–145)

## 2024-10-16 LAB — CBC
HCT: 32 % — ABNORMAL LOW (ref 39.0–52.0)
Hemoglobin: 10.1 g/dL — ABNORMAL LOW (ref 13.0–17.0)
MCH: 30.3 pg (ref 26.0–34.0)
MCHC: 31.6 g/dL (ref 30.0–36.0)
MCV: 96.1 fL (ref 80.0–100.0)
Platelets: 266 K/uL (ref 150–400)
RBC: 3.33 MIL/uL — ABNORMAL LOW (ref 4.22–5.81)
RDW: 14.2 % (ref 11.5–15.5)
WBC: 4.8 K/uL (ref 4.0–10.5)
nRBC: 0 % (ref 0.0–0.2)

## 2024-10-16 MED ORDER — DARUN-COBIC-EMTRICIT-TENOFAF 800-150-200-10 MG PO TABS
1.0000 | ORAL_TABLET | Freq: Every day | ORAL | Status: DC
  Administered 2024-10-16: 1 via ORAL
  Filled 2024-10-16 (×2): qty 1

## 2024-10-16 MED ORDER — LACTATED RINGERS IV SOLN: INTRAVENOUS | Status: DC

## 2024-10-16 NOTE — Discharge Instructions (Signed)
 FOOD PANTRY [facility] [address] [place], [place] [address] [phone]  [facility] [address] [place], [place] [address] [phone]  [name] - Food Distribution Center [address] [place], [place] [address] [phone]  [facility] [address] [place], [place] [address] [phone]  [facility] - Food Distribution Center [address] [place], [place] [address] [phone]  UTILITIES [facility] [address] [place], [place] [address]  [phone] Rental assistance/rental hotline: (404)[phone] ext. 340 Utility assistance/utility hotline: 845-044-5419 ext. [address] Department of It Consultant (heating/cooling and water  assistance) [phone] (rental and utility assistance) [phone]  [employer] - call 211  TRANSPORTATION [place] [employer] [address]

## 2024-10-16 NOTE — Plan of Care (Signed)

## 2024-10-16 NOTE — Progress Notes (Signed)
 I attest to student documentation.   Rashon Westrup, MSN-RN Clinical Instructor/Nursing Faculty [facility] Associate Degree Nursing I

## 2024-10-16 NOTE — TOC Initial Note (Signed)
 Transition of Care [facility]) - Initial/Assessment Note    Patient Details  Name: [name] [name] MRN: [id number] Date of Birth: Jul 22, 1996  Transition of Care [facility]) CM/SW Contact:    Toy LITTIE Agar, RN Phone Number:[phone]  10/16/2024, 1:03 PM  Clinical Narrative:                 SDOH resources for (utilities, transportation, shelters, substance abuse and food pantry) have been added to AVS    Barriers to Discharge: Continued Medical Work up   Patient Goals and CMS Choice            Expected Discharge Plan and Services                                              Prior Living Arrangements/Services                       Activities of Daily Living      Permission Sought/Granted                  Emotional Assessment              Admission diagnosis:  Ileus (HCC) [K56.7] Nausea vomiting and diarrhea [R11.2, R19.7] Patient Active Problem List   Diagnosis Date Noted   Ileus (HCC) 10/15/2024   Sepsis (HCC) 09/25/2024   Multifocal pneumonia 09/22/2024   Sore throat 01/19/2024   Anxiety 01/19/2024   Abscess of face 10/15/2023   Facial cellulitis 10/15/2023   Polysubstance use disorder 03/10/2023   Male-to-male transgender person 02/07/2023   Poor social situation 02/07/2023   Syphilis 06/02/2020   Healthcare maintenance 06/02/2020   Axillary abscess 02/13/2020   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, 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   Enteropathogenic Escherichia coli infection 05/26/2017   Acute renal failure (ARF) 05/24/2017   Abnormal brain MRI 02/07/2017   Rash 11/15/2016   Hypotension 11/05/2016   Shingles 09/14/2016   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 [place] [address] Phone: [phone] Fax: [phone]  [id number] - [place], [place] - [address] AT [facility] OF [address] [place] [address] Phone: [phone] Fax: [phone]  [facility] [address] [place] [address] Phone: [phone] Fax: [phone]  [id number] [place], [place] - [address] AT [facility] [address] [place] [address] Phone: [phone] Fax: [phone]  [facility] [address] [place] [address] Phone: [phone] Fax: [phone]     Social Drivers of Health (SDOH) Social History: SDOH Screenings   Food Insecurity: Food Insecurity Present (10/15/2024)  Housing: High Risk (10/15/2024)  Transportation Needs: Unmet Transportation Needs (10/15/2024)  Utilities: At Risk (10/15/2024)  Depression (PHQ2-9): Low Risk (01/19/2024)  Financial Resource Strain: Medium Risk (09/09/2022)   Received from  [facility]  Tobacco Use: High Risk (10/15/2024)   SDOH Interventions: Food Insecurity Interventions: Inpatient TOC, Community Resources Provided Housing Interventions: Inpatient TOC, [employer] Resources Provided Transportation Interventions: [employer] Provided, Inpatient TOC Utilities Interventions: Inpatient TOC, Community Resources Provided   Readmission Risk Interventions     No data to display

## 2024-10-16 NOTE — Plan of Care (Signed)
  Problem: Health Behavior/Discharge Planning: Goal: Ability to manage health-related needs will improve Outcome: Progressing   Problem: Clinical Measurements: Goal: Diagnostic test results will improve Outcome: Progressing   Problem: Nutrition: Goal: Adequate nutrition will be maintained Outcome: Progressing

## 2024-10-16 NOTE — Progress Notes (Signed)
" °   10/16/24 1255  TOC Brief Assessment  Insurance and Status Reviewed  Patient has primary care physician No  Home environment has been reviewed Homeless  Prior level of function: Independent  Prior/Current Home Services No current home services  Social Drivers of Health Review SDOH reviewed interventions complete (shelter resources added to AVS)  Readmission risk has been reviewed Yes  Transition of care needs no transition of care needs at this time    "

## 2024-10-16 NOTE — Progress Notes (Signed)
 " PROGRESS NOTE    [name] [name]  FMW:[id number] DOB: 17-Dec-1995 DOA: 10/15/2024 PCP: Patient, No Pcp Per     Brief Narrative:  [name] [name] is a 29 y.o. adult biological male transitioned to male with medical history significant for HIV disease, substance abuse including amphetamines, medication noncompliance recently hospitalized for community-acquired pneumonia being admitted to the hospital with 1 day of abdominal pain and vomiting found to have an ileus.  They tell me that they have done well since their last hospital discharge, took their antibiotics and antifungal medications.  No fevers or chills, was essentially asymptomatic.  However, they did not get their HIV medications or Bactrim  filled prior to being discharged from the hospital, went to their pharmacy at [facility] and was unable to get the medications filled as well.  In any case, they were doing well until yesterday, when there was sudden onset of abdominal pain crampy in nature, nausea and vomiting.  Has never had this problem before.  Had a normal bowel movement 2 days ago, since yesterday has just had small amounts of liquid stool coming out.  Has not had any flatus this morning, still having abdominal pain and is not hungry.  New events last 24 hours / Subjective: Tolerating clear liquid this morning.  Assessment & Plan:   Principal Problem:   Ileus (HCC)   Ileus - Supportive care - Advance diet  HIV - Resume Symtuza , Bactrim   DVT prophylaxis:  enoxaparin  (LOVENOX ) injection 40 mg Start: 10/15/24 1000  Code Status: Full code Family Communication: None Disposition Plan: Home Status is: Observation The patient will require care spanning > 2 midnights and should be moved to inpatient because: Ileus    Antimicrobials:  Anti-infectives (From admission, onward)    Start     Dose/Rate Route Frequency Ordered Stop   10/15/24 1000  sulfamethoxazole -trimethoprim  (BACTRIM ) 400-80 MG per tablet 1 tablet         1 tablet Oral Daily 10/15/24 0730          Objective: Vitals:   10/16/24 0533 10/16/24 0845 10/16/24 1203 10/16/24 1415  BP: 112/75 120/72 121/75 93/63  Pulse: 78 81 80 74  Resp: 18 18  14   Temp: 98.5 F (36.9 C) 98.2 F (36.8 C) 97.9 F (36.6 C) 97.9 F (36.6 C)  TempSrc: Oral Oral Oral Oral  SpO2: 99% 100% 100% 98%    Intake/Output Summary (Last 24 hours) at 10/16/2024 1548 Last data filed at 10/16/2024 1536 Gross per 24 hour  Intake 914.59 ml  Output --  Net 914.59 ml   There were no vitals filed for this visit.  Examination:  General exam: Appears calm and comfortable  Respiratory system: Clear to auscultation. Respiratory effort normal. No respiratory distress. No conversational dyspnea.  Cardiovascular system: S1 & S2 heard, RRR. No murmurs. No pedal edema. Gastrointestinal system: Abdomen is nondistended, soft and nontender. Normal bowel sounds heard. Central nervous system: Alert and oriented. No focal neurological deficits. Speech clear.  Extremities: Symmetric in appearance  Skin: No rashes, lesions or ulcers on exposed skin  Psychiatry: Judgement and insight appear normal. Mood & affect appropriate.   Data Reviewed: I have personally reviewed following labs and imaging studies  CBC: Recent Labs  Lab 10/15/24 0245 10/16/24 0604  WBC 10.4 4.8  NEUTROABS 3.8  --   HGB 12.5* 10.1*  HCT 38.9* 32.0*  MCV 96.8 96.1  PLT 274 266   Basic Metabolic Panel: Recent Labs  Lab 10/15/24 0245 10/16/24  0604  NA 134* 134*  K 4.7 4.3  CL 106 107  CO2 17* 19*  GLUCOSE 90 91  BUN 16 15  CREATININE 1.15 1.13  CALCIUM 9.4 9.0   GFR: CrCl cannot be calculated (Unknown ideal weight.). Liver Function Tests: Recent Labs  Lab 10/15/24 0245  AST 37  ALT 27  ALKPHOS 101  BILITOT 0.4  PROT 10.6*  ALBUMIN 4.4   Recent Labs  Lab 10/15/24 0245  LIPASE 14   No results for input(s): AMMONIA in the last 168 hours. Coagulation Profile: No results for  input(s): INR, PROTIME in the last 168 hours. Cardiac Enzymes: No results for input(s): CKTOTAL, CKMB, CKMBINDEX, TROPONINI in the last 168 hours. BNP (last 3 results) No results for input(s): PROBNP in the last 8760 hours. HbA1C: No results for input(s): HGBA1C in the last 72 hours. CBG: No results for input(s): GLUCAP in the last 168 hours. Lipid Profile: No results for input(s): CHOL, HDL, LDLCALC, TRIG, CHOLHDL, LDLDIRECT in the last 72 hours. Thyroid Function Tests: No results for input(s): TSH, T4TOTAL, FREET4, T3FREE, THYROIDAB in the last 72 hours. Anemia Panel: No results for input(s): VITAMINB12, FOLATE, FERRITIN, TIBC, IRON, RETICCTPCT in the last 72 hours. Sepsis Labs: No results for input(s): PROCALCITON, LATICACIDVEN in the last 168 hours.  Recent Results (from the past 240 hours)  Resp panel by RT-PCR (RSV, Flu A&B, Covid) Anterior Nasal Swab     Status: None   Collection Time: 10/15/24  4:50 AM   Specimen: Anterior Nasal Swab  Result Value Ref Range Status   SARS Coronavirus 2 by RT PCR NEGATIVE NEGATIVE Final    Comment: (NOTE) SARS-CoV-2 target nucleic acids are NOT DETECTED.  The SARS-CoV-2 RNA is generally detectable in upper respiratory specimens during the acute phase of infection. The lowest concentration of SARS-CoV-2 viral copies this assay can detect is 138 copies/mL. A negative result does not preclude SARS-Cov-2 infection and should not be used as the sole basis for treatment or other patient management decisions. A negative result may occur with  improper specimen collection/handling, submission of specimen other than nasopharyngeal swab, presence of viral mutation(s) within the areas targeted by this assay, and inadequate number of viral copies(<138 copies/mL). A negative result must be combined with clinical observations, patient history, and epidemiological information. The expected result is  Negative.  Fact Sheet for Patients:  bloggercourse.com  Fact Sheet for Healthcare Providers:  seriousbroker.it  This test is no t yet approved or cleared by the [place]  FDA and  has been authorized for detection and/or diagnosis of SARS-CoV-2 by FDA under an Emergency Use Authorization (EUA). This EUA will remain  in effect (meaning this test can be used) for the duration of the COVID-19 declaration under Section 564(b)(1) of the Act, 21 U.S.C.section 360bbb-3(b)(1), unless the authorization is terminated  or revoked sooner.       Influenza A by PCR NEGATIVE NEGATIVE Final   Influenza B by PCR NEGATIVE NEGATIVE Final    Comment: (NOTE) The Xpert Xpress SARS-CoV-2/FLU/RSV plus assay is intended as an aid in the diagnosis of influenza from Nasopharyngeal swab specimens and should not be used as a sole basis for treatment. Nasal washings and aspirates are unacceptable for Xpert Xpress SARS-CoV-2/FLU/RSV testing.  Fact Sheet for Patients: bloggercourse.com  Fact Sheet for Healthcare Providers: seriousbroker.it  This test is not yet approved or cleared by the [place]  FDA and has been authorized for detection and/or diagnosis of SARS-CoV-2 by FDA under an Emergency Use  Authorization (EUA). This EUA will remain in effect (meaning this test can be used) for the duration of the COVID-19 declaration under Section 564(b)(1) of the Act, 21 U.S.C. section 360bbb-3(b)(1), unless the authorization is terminated or revoked.     Resp Syncytial Virus by PCR NEGATIVE NEGATIVE Final    Comment: (NOTE) Fact Sheet for Patients: bloggercourse.com  Fact Sheet for Healthcare Providers: seriousbroker.it  This test is not yet approved or cleared by the [place]  FDA and has been authorized for detection and/or diagnosis of  SARS-CoV-2 by FDA under an Emergency Use Authorization (EUA). This EUA will remain in effect (meaning this test can be used) for the duration of the COVID-19 declaration under Section 564(b)(1) of the Act, 21 U.S.C. section 360bbb-3(b)(1), unless the authorization is terminated or revoked.  Performed at [facility], [address] [place], [place] [address]       Radiology Studies: CT ABDOMEN PELVIS W CONTRAST Result Date: 10/15/2024 EXAM: CT ABDOMEN AND PELVIS WITH CONTRAST 10/15/2024 04:05:15 AM TECHNIQUE: CT of the abdomen and pelvis was performed with the administration of 100 mL (iohexol  (OMNIPAQUE ) 300 MG/ML solution 100 mL IOHEXOL  300 MG/ML SOLN) intravenous contrast. Multiplanar reformatted images are provided for review. Automated exposure control, iterative reconstruction, and/or weight-based adjustment of the mA/kV was utilized to reduce the radiation dose to as low as reasonably achievable. COMPARISON: CT with oral contrast only, abdomen and pelvis 05/24/2017, CT pelvis with IV contrast 05/08/2023. CLINICAL HISTORY: Abdominal pain, acute, nonlocalized. FINDINGS: LOWER CHEST: There is increased linear scarring or atelectasis in the right middle lobe. In the posterior medial left lower lobe base there are new opacities which could be due to scarring and atelectasis versus pneumonic infiltrate or aspiration. The cardiac size is normal. The lung bases are clear elsewhere. LIVER: The liver is mildly steatotic without evidence of mass. GALLBLADDER AND BILE DUCTS: Gallbladder is unremarkable. No biliary ductal dilatation. SPLEEN: No acute abnormality. PANCREAS: No acute abnormality. ADRENAL GLANDS: No adrenal mass. KIDNEYS, URETERS AND BLADDER: There is no renal mass or stone. No hydronephrosis. No perinephric or periureteral stranding. Urinary bladder is unremarkable. GI AND BOWEL: There is mild gastric dilatation with air and fluid. There are thickened jejunal folds  consistent with nonspecific enteritis. There is dilatation in the mid to lower abdominal small bowel up to 3.5 cm, in the lower abdomen with dilated interspersed with decompressed small bowel and no discrete transition identifiable. The appendix is normal. There is fluid in the colon. The small bowel dilatation could be due to a low-grade obstruction. Other findings with fluid throughout the colon favor nonspecific ileocolitis with ileus . There are no mesenteric inflammatory changes. PERITONEUM AND RETROPERITONEUM: No free fluid, free air, free hemorrhage, or localized collections. VASCULATURE: Aorta is normal in caliber. LYMPH NODES: No lymphadenopathy. REPRODUCTIVE ORGANS: No acute abnormality. BONES AND SOFT TISSUES: No acute osseous abnormality. No focal soft tissue abnormality. IMPRESSION: 1. Small bowel dilatation up to 3.5 cm in the mid/lower abdomen, possibly due to a low-grade obstruction versus ileus. No discrete transition identified. 2. Thickened jejunal folds consistent with nonspecific enteritis. 3. Fluid throughout the colon, favoring nonspecific ileocolitis. 4. Mild gastric dilatation with air and fluid. 5. New opacities in the posterior medial left lower lobe base, possibly due to scarring and atelectasis versus pneumonic infiltrate or aspiration. Electronically signed by: Francis Quam MD 10/15/2024 04:27 AM EST RP Workstation: [id number]   DG Chest 2 View Result Date: 10/15/2024 EXAM: 2 VIEW(S) XRAY OF THE CHEST 10/15/2024 02:54:00  AM COMPARISON: 09/21/2024 CLINICAL HISTORY: cough FINDINGS: LUNGS AND PLEURA: No focal pulmonary opacity. No pleural effusion. No pneumothorax. HEART AND MEDIASTINUM: No acute abnormality of the cardiac and mediastinal silhouettes. BONES AND SOFT TISSUES: Bilateral nipple shadows. No acute osseous abnormality. IMPRESSION: 1. No acute cardiopulmonary abnormality. Electronically signed by: Pinkie Pebbles MD 10/15/2024 03:07 AM EST RP Workstation: [id number]       Scheduled Meds:  cyanocobalamin   1,000 mcg Oral Daily   enoxaparin  (LOVENOX ) injection  40 mg Subcutaneous Q24H   ferrous sulfate   325 mg Oral Q breakfast   sulfamethoxazole -trimethoprim   1 tablet Oral Daily   Continuous Infusions:  lactated ringers  50 mL/hr at 10/16/24 1045     LOS: 0 days   Time spent: 25 minutes   Delon Hoe, DO [facility] 10/16/2024, 3:48 PM   Available via Epic secure chat 7am-7pm After these hours, please refer to coverage provider listed on [email or web address]  "

## 2024-10-17 ENCOUNTER — Telehealth: Payer: Self-pay

## 2024-10-17 DIAGNOSIS — K567 Ileus, unspecified: Secondary | ICD-10-CM | POA: Diagnosis not present

## 2024-10-17 NOTE — Telephone Encounter (Signed)
 Received call from Luke with [facility] referral team to schedule patient for follow up with our office after patient's recent ED visit with them on 1/7.  [name] [name] has been to a [facility] ED three times since then.   Called [name] [name] to schedule appointment, no answer. Left HIPAA compliant voicemail requesting callback.   Aanchal Cope, BSN, RN

## 2024-10-17 NOTE — Discharge Summary (Signed)
 Physician Discharge Summary  [name] [name] FMW:[id number] DOB: 1995/10/16 DOA: 10/15/2024  PCP: Patient, No Pcp Per  Admit date: 10/15/2024 Discharge date: 10/17/2024  Admitted From: Home Disposition:  Home  Recommendations for Outpatient Follow-up:  Follow up with PCP   Discharge Condition: Stable CODE STATUS: Full  Diet recommendation: Regular  Brief/Interim Summary: [name] [name] is a 29 y.o. adult biological male transitioned to male with medical history significant for HIV disease, substance abuse including amphetamines, medication noncompliance recently hospitalized for community-acquired pneumonia being admitted to the hospital with 1 day of abdominal pain and vomiting found to have an ileus.  They tell me that they have done well since their last hospital discharge, took their antibiotics and antifungal medications.  No fevers or chills, was essentially asymptomatic.  However, they did not get their HIV medications or Bactrim  filled prior to being discharged from the hospital, went to their pharmacy at [facility] and was unable to get the medications filled as well.  In any case, they were doing well until yesterday, when there was sudden onset of abdominal pain crampy in nature, nausea and vomiting.  Has never had this problem before.  Had a normal bowel movement 2 days ago, since yesterday has just had small amounts of liquid stool coming out.  Has not had any flatus this morning, still having abdominal pain and is not hungry.  Patient was treated with supportive care and improved. On day of discharge, patient was tolerating diet and symptoms resolved.   Discharge Diagnoses:   Principal Problem:   Ileus (HCC) Active Problems:   AIDS (acquired immune deficiency syndrome) [facility])    Discharge Instructions  Discharge Instructions     Call MD for:  difficulty breathing, headache or visual disturbances   Complete by: As directed    Call MD for:  extreme fatigue    Complete by: As directed    Call MD for:  persistant dizziness or light-headedness   Complete by: As directed    Call MD for:  persistant nausea and vomiting   Complete by: As directed    Call MD for:  severe uncontrolled pain   Complete by: As directed    Call MD for:  temperature >100.4   Complete by: As directed    Discharge instructions   Complete by: As directed    You were cared for by a hospitalist during your hospital stay. If you have any questions about your discharge medications or the care you received while you were in the hospital after you are discharged, you can call the unit and ask to speak with the hospitalist on call if the hospitalist that took care of you is not available. Once you are discharged, your primary care physician will handle any further medical issues. Please note that NO REFILLS for any discharge medications will be authorized once you are discharged, as it is imperative that you return to your primary care physician (or establish a relationship with a primary care physician if you do not have one) for your aftercare needs so that they can reassess your need for medications and monitor your lab values.   Increase activity slowly   Complete by: As directed       Allergies as of 10/17/2024   No Known Allergies      Medication List     STOP taking these medications    fluconazole  200 MG tablet Commonly known as: DIFLUCAN        TAKE these medications  cyanocobalamin  1000 MCG tablet Take 1 tablet (1,000 mcg total) by mouth daily.   ferrous sulfate  325 (65 FE) MG tablet Take 1 tablet (325 mg total) by mouth daily with breakfast.   sulfamethoxazole -trimethoprim  400-80 MG tablet Commonly known as: BACTRIM  Take 1 tablet by mouth daily.   Symtuza  800-150-200-10 MG Tabs Generic drug: Darunavir -Cobicistat -Emtricitabine -Tenofovir  Alafenamide Take 1 tablet by mouth daily.        Allergies[1]  Procedures/Studies: CT ABDOMEN PELVIS W  CONTRAST Result Date: 10/15/2024 EXAM: CT ABDOMEN AND PELVIS WITH CONTRAST 10/15/2024 04:05:15 AM TECHNIQUE: CT of the abdomen and pelvis was performed with the administration of 100 mL (iohexol  (OMNIPAQUE ) 300 MG/ML solution 100 mL IOHEXOL  300 MG/ML SOLN) intravenous contrast. Multiplanar reformatted images are provided for review. Automated exposure control, iterative reconstruction, and/or weight-based adjustment of the mA/kV was utilized to reduce the radiation dose to as low as reasonably achievable. COMPARISON: CT with oral contrast only, abdomen and pelvis 05/24/2017, CT pelvis with IV contrast 05/08/2023. CLINICAL HISTORY: Abdominal pain, acute, nonlocalized. FINDINGS: LOWER CHEST: There is increased linear scarring or atelectasis in the right middle lobe. In the posterior medial left lower lobe base there are new opacities which could be due to scarring and atelectasis versus pneumonic infiltrate or aspiration. The cardiac size is normal. The lung bases are clear elsewhere. LIVER: The liver is mildly steatotic without evidence of mass. GALLBLADDER AND BILE DUCTS: Gallbladder is unremarkable. No biliary ductal dilatation. SPLEEN: No acute abnormality. PANCREAS: No acute abnormality. ADRENAL GLANDS: No adrenal mass. KIDNEYS, URETERS AND BLADDER: There is no renal mass or stone. No hydronephrosis. No perinephric or periureteral stranding. Urinary bladder is unremarkable. GI AND BOWEL: There is mild gastric dilatation with air and fluid. There are thickened jejunal folds consistent with nonspecific enteritis. There is dilatation in the mid to lower abdominal small bowel up to 3.5 cm, in the lower abdomen with dilated interspersed with decompressed small bowel and no discrete transition identifiable. The appendix is normal. There is fluid in the colon. The small bowel dilatation could be due to a low-grade obstruction. Other findings with fluid throughout the colon favor nonspecific ileocolitis with ileus .  There are no mesenteric inflammatory changes. PERITONEUM AND RETROPERITONEUM: No free fluid, free air, free hemorrhage, or localized collections. VASCULATURE: Aorta is normal in caliber. LYMPH NODES: No lymphadenopathy. REPRODUCTIVE ORGANS: No acute abnormality. BONES AND SOFT TISSUES: No acute osseous abnormality. No focal soft tissue abnormality. IMPRESSION: 1. Small bowel dilatation up to 3.5 cm in the mid/lower abdomen, possibly due to a low-grade obstruction versus ileus. No discrete transition identified. 2. Thickened jejunal folds consistent with nonspecific enteritis. 3. Fluid throughout the colon, favoring nonspecific ileocolitis. 4. Mild gastric dilatation with air and fluid. 5. New opacities in the posterior medial left lower lobe base, possibly due to scarring and atelectasis versus pneumonic infiltrate or aspiration. Electronically signed by: Francis Quam MD 10/15/2024 04:27 AM EST RP Workstation: [id number]   DG Chest 2 View Result Date: 10/15/2024 EXAM: 2 VIEW(S) XRAY OF THE CHEST 10/15/2024 02:54:00 AM COMPARISON: 09/21/2024 CLINICAL HISTORY: cough FINDINGS: LUNGS AND PLEURA: No focal pulmonary opacity. No pleural effusion. No pneumothorax. HEART AND MEDIASTINUM: No acute abnormality of the cardiac and mediastinal silhouettes. BONES AND SOFT TISSUES: Bilateral nipple shadows. No acute osseous abnormality. IMPRESSION: 1. No acute cardiopulmonary abnormality. Electronically signed by: Pinkie Pebbles MD 10/15/2024 03:07 AM EST RP Workstation: [id number]   CT Angio Chest Pulmonary Embolism (PE) W or WO Contrast Result Date: 09/23/2024 EXAM: CTA CHEST 09/23/2024 06:49:13 PM  TECHNIQUE: CTA of the chest was performed without and with the administration of 75 mL of intravenous iohexol  (OMNIPAQUE ) 350 MG/ML injection. Multiplanar reformatted images are provided for review. MIP images are provided for review. Automated exposure control, iterative reconstruction, and/or weight based adjustment of the  mA/kV was utilized to reduce the radiation dose to as low as reasonably achievable. COMPARISON: None available. CLINICAL HISTORY: Smoker, pleuritic chest pain left sided. ?PE. FINDINGS: PULMONARY ARTERIES: Pulmonary arteries are adequately opacified for evaluation. No acute pulmonary embolus. Main pulmonary artery is normal in caliber. MEDIASTINUM: The heart and pericardium demonstrate no acute abnormality. There is no acute abnormality of the thoracic aorta. LYMPH NODES: No mediastinal, hilar or axillary lymphadenopathy. LUNGS AND PLEURA: There is dense consolidation within the left lower lobe in keeping with acute lobar pneumonia in the appropriate clinical setting. Scattered airspace infiltrate is also seen within the basilar right middle lobe. Mild right basilar atelectasis. No central obstructing lesion. No pneumothorax or pleural effusion. UPPER ABDOMEN: Limited images of the upper abdomen are unremarkable. SOFT TISSUES AND BONES: No acute bone or soft tissue abnormality. IMPRESSION: 1. No pulmonary embolism. 2. Dense consolidation within the left lower lobe, consistent with acute lobar pneumonia. Electronically signed by: Dorethia Molt MD 09/23/2024 09:30 PM EST RP Workstation: [id number]   DG Chest 2 View Result Date: 09/21/2024 EXAM: 2 VIEW(S) XRAY OF THE CHEST 09/21/2024 09:50:02 PM COMPARISON: None available. CLINICAL HISTORY: shob FINDINGS: LUNGS AND PLEURA: Bibasilar pulmonary airspace infiltrates, left greater than right, in keeping with multifocal infection or aspiration. No pleural effusion. No pneumothorax. HEART AND MEDIASTINUM: No acute abnormality of the cardiac and mediastinal silhouettes. BONES AND SOFT TISSUES: No acute osseous abnormality. IMPRESSION: 1. Bibasilar pulmonary airspace infiltrates, left greater than right, in keeping with multifocal infection or aspiration. Electronically signed by: Dorethia Molt MD 09/21/2024 10:39 PM EST RP Workstation: [id number]      Discharge  Exam: Vitals:   10/16/24 1911 10/17/24 0527  BP: 124/74 123/75  Pulse: 90 86  Resp: 18 18  Temp: 98 F (36.7 C) 98.1 F (36.7 C)  SpO2: 100% 100%    General: Pt is alert, awake, not in acute distress Cardiovascular: RRR, S1/S2 +, no edema Respiratory: CTA bilaterally, no wheezing, no rhonchi, no respiratory distress, no conversational dyspnea  Abdominal: Soft, NT, ND, bowel sounds + Extremities: no edema, no cyanosis Psych: Normal mood and affect, stable judgement and insight     The results of significant diagnostics from this hospitalization (including imaging, microbiology, ancillary and laboratory) are listed below for reference.     Microbiology: Recent Results (from the past 240 hours)  Resp panel by RT-PCR (RSV, Flu A&B, Covid) Anterior Nasal Swab     Status: None   Collection Time: 10/15/24  4:50 AM   Specimen: Anterior Nasal Swab  Result Value Ref Range Status   SARS Coronavirus 2 by RT PCR NEGATIVE NEGATIVE Final    Comment: (NOTE) SARS-CoV-2 target nucleic acids are NOT DETECTED.  The SARS-CoV-2 RNA is generally detectable in upper respiratory specimens during the acute phase of infection. The lowest concentration of SARS-CoV-2 viral copies this assay can detect is 138 copies/mL. A negative result does not preclude SARS-Cov-2 infection and should not be used as the sole basis for treatment or other patient management decisions. A negative result may occur with  improper specimen collection/handling, submission of specimen other than nasopharyngeal swab, presence of viral mutation(s) within the areas targeted by this assay, and inadequate number of viral copies(<138 copies/mL). A  negative result must be combined with clinical observations, patient history, and epidemiological information. The expected result is Negative.  Fact Sheet for Patients:  bloggercourse.com  Fact Sheet for Healthcare Providers:   seriousbroker.it  This test is no t yet approved or cleared by the [place]  FDA and  has been authorized for detection and/or diagnosis of SARS-CoV-2 by FDA under an Emergency Use Authorization (EUA). This EUA will remain  in effect (meaning this test can be used) for the duration of the COVID-19 declaration under Section 564(b)(1) of the Act, 21 U.S.C.section 360bbb-3(b)(1), unless the authorization is terminated  or revoked sooner.       Influenza A by PCR NEGATIVE NEGATIVE Final   Influenza B by PCR NEGATIVE NEGATIVE Final    Comment: (NOTE) The Xpert Xpress SARS-CoV-2/FLU/RSV plus assay is intended as an aid in the diagnosis of influenza from Nasopharyngeal swab specimens and should not be used as a sole basis for treatment. Nasal washings and aspirates are unacceptable for Xpert Xpress SARS-CoV-2/FLU/RSV testing.  Fact Sheet for Patients: bloggercourse.com  Fact Sheet for Healthcare Providers: seriousbroker.it  This test is not yet approved or cleared by the [place]  FDA and has been authorized for detection and/or diagnosis of SARS-CoV-2 by FDA under an Emergency Use Authorization (EUA). This EUA will remain in effect (meaning this test can be used) for the duration of the COVID-19 declaration under Section 564(b)(1) of the Act, 21 U.S.C. section 360bbb-3(b)(1), unless the authorization is terminated or revoked.     Resp Syncytial Virus by PCR NEGATIVE NEGATIVE Final    Comment: (NOTE) Fact Sheet for Patients: bloggercourse.com  Fact Sheet for Healthcare Providers: seriousbroker.it  This test is not yet approved or cleared by the [place]  FDA and has been authorized for detection and/or diagnosis of SARS-CoV-2 by FDA under an Emergency Use Authorization (EUA). This EUA will remain in effect (meaning this test can be used) for  the duration of the COVID-19 declaration under Section 564(b)(1) of the Act, 21 U.S.C. section 360bbb-3(b)(1), unless the authorization is terminated or revoked.  Performed at [facility], [address], [place], [place] [address]      Labs: BNP (last 3 results) No results for input(s): BNP in the last 8760 hours. Basic Metabolic Panel: Recent Labs  Lab 10/15/24 0245 10/16/24 0604  NA 134* 134*  K 4.7 4.3  CL 106 107  CO2 17* 19*  GLUCOSE 90 91  BUN 16 15  CREATININE 1.15 1.13  CALCIUM 9.4 9.0   Liver Function Tests: Recent Labs  Lab 10/15/24 0245  AST 37  ALT 27  ALKPHOS 101  BILITOT 0.4  PROT 10.6*  ALBUMIN 4.4   Recent Labs  Lab 10/15/24 0245  LIPASE 14   No results for input(s): AMMONIA in the last 168 hours. CBC: Recent Labs  Lab 10/15/24 0245 10/16/24 0604  WBC 10.4 4.8  NEUTROABS 3.8  --   HGB 12.5* 10.1*  HCT 38.9* 32.0*  MCV 96.8 96.1  PLT 274 266   Cardiac Enzymes: No results for input(s): CKTOTAL, CKMB, CKMBINDEX, TROPONINI in the last 168 hours. BNP: Invalid input(s): POCBNP CBG: No results for input(s): GLUCAP in the last 168 hours. D-Dimer No results for input(s): DDIMER in the last 72 hours. Hgb A1c No results for input(s): HGBA1C in the last 72 hours. Lipid Profile No results for input(s): CHOL, HDL, LDLCALC, TRIG, CHOLHDL, LDLDIRECT in the last 72 hours. Thyroid function studies No results for input(s): TSH, T4TOTAL, T3FREE,  THYROIDAB in the last 72 hours.  Invalid input(s): FREET3 Anemia work up No results for input(s): VITAMINB12, FOLATE, FERRITIN, TIBC, IRON, RETICCTPCT in the last 72 hours. Urinalysis    Component Value Date/Time   COLORURINE YELLOW 09/22/2024 0732   APPEARANCEUR CLEAR 09/22/2024 0732   LABSPEC 1.019 09/22/2024 0732   PHURINE 5.0 09/22/2024 0732   GLUCOSEU NEGATIVE 09/22/2024 0732   HGBUR NEGATIVE 09/22/2024 0732    BILIRUBINUR NEGATIVE 09/22/2024 0732   KETONESUR NEGATIVE 09/22/2024 0732   PROTEINUR NEGATIVE 09/22/2024 0732   NITRITE NEGATIVE 09/22/2024 0732   LEUKOCYTESUR NEGATIVE 09/22/2024 0732   Sepsis Labs Recent Labs  Lab 10/15/24 0245 10/16/24 0604  WBC 10.4 4.8   Microbiology Recent Results (from the past 240 hours)  Resp panel by RT-PCR (RSV, Flu A&B, Covid) Anterior Nasal Swab     Status: None   Collection Time: 10/15/24  4:50 AM   Specimen: Anterior Nasal Swab  Result Value Ref Range Status   SARS Coronavirus 2 by RT PCR NEGATIVE NEGATIVE Final    Comment: (NOTE) SARS-CoV-2 target nucleic acids are NOT DETECTED.  The SARS-CoV-2 RNA is generally detectable in upper respiratory specimens during the acute phase of infection. The lowest concentration of SARS-CoV-2 viral copies this assay can detect is 138 copies/mL. A negative result does not preclude SARS-Cov-2 infection and should not be used as the sole basis for treatment or other patient management decisions. A negative result may occur with  improper specimen collection/handling, submission of specimen other than nasopharyngeal swab, presence of viral mutation(s) within the areas targeted by this assay, and inadequate number of viral copies(<138 copies/mL). A negative result must be combined with clinical observations, patient history, and epidemiological information. The expected result is Negative.  Fact Sheet for Patients:  bloggercourse.com  Fact Sheet for Healthcare Providers:  seriousbroker.it  This test is no t yet approved or cleared by the [place]  FDA and  has been authorized for detection and/or diagnosis of SARS-CoV-2 by FDA under an Emergency Use Authorization (EUA). This EUA will remain  in effect (meaning this test can be used) for the duration of the COVID-19 declaration under Section 564(b)(1) of the Act, 21 U.S.C.section 360bbb-3(b)(1), unless the  authorization is terminated  or revoked sooner.       Influenza A by PCR NEGATIVE NEGATIVE Final   Influenza B by PCR NEGATIVE NEGATIVE Final    Comment: (NOTE) The Xpert Xpress SARS-CoV-2/FLU/RSV plus assay is intended as an aid in the diagnosis of influenza from Nasopharyngeal swab specimens and should not be used as a sole basis for treatment. Nasal washings and aspirates are unacceptable for Xpert Xpress SARS-CoV-2/FLU/RSV testing.  Fact Sheet for Patients: bloggercourse.com  Fact Sheet for Healthcare Providers: seriousbroker.it  This test is not yet approved or cleared by the [place]  FDA and has been authorized for detection and/or diagnosis of SARS-CoV-2 by FDA under an Emergency Use Authorization (EUA). This EUA will remain in effect (meaning this test can be used) for the duration of the COVID-19 declaration under Section 564(b)(1) of the Act, 21 U.S.C. section 360bbb-3(b)(1), unless the authorization is terminated or revoked.     Resp Syncytial Virus by PCR NEGATIVE NEGATIVE Final    Comment: (NOTE) Fact Sheet for Patients: bloggercourse.com  Fact Sheet for Healthcare Providers: seriousbroker.it  This test is not yet approved or cleared by the [place]  FDA and has been authorized for detection and/or diagnosis of SARS-CoV-2 by FDA under an Emergency Use Authorization (EUA). This EUA will  remain in effect (meaning this test can be used) for the duration of the COVID-19 declaration under Section 564(b)(1) of the Act, 21 U.S.C. section 360bbb-3(b)(1), unless the authorization is terminated or revoked.  Performed at [facility], [address], [place], [place] [address]      Patient was seen and examined on the day of discharge and was found to be in stable condition. Time coordinating discharge: 25 minutes including assessment and  coordination of care, as well as examination of the patient.   SIGNED:  Delon Hoe, DO [facility] 10/17/2024, 8:21 AM       [1] No Known Allergies

## 2024-10-17 NOTE — Plan of Care (Signed)

## 2024-10-21 ENCOUNTER — Emergency Department (HOSPITAL_COMMUNITY)
Admission: EM | Admit: 2024-10-21 | Discharge: 2024-10-21 | Discharge status: Against Medical Advice | Source: Home / Self Care
